# Patient Record
Sex: Male | Born: 1975 | Race: White | Hispanic: No | Marital: Married | Smoking: Never smoker
Health system: Southern US, Community
[De-identification: ages and names within clinical notes are randomized; demographics above are authoritative.]

## PROBLEM LIST (undated history)

## (undated) ENCOUNTER — Emergency Department: Disposition: A | Discharge status: Home / Self Care | Payer: Self-pay

## (undated) DIAGNOSIS — N200 Calculus of kidney: Secondary | ICD-10-CM

## (undated) DIAGNOSIS — E291 Testicular hypofunction: Secondary | ICD-10-CM

## (undated) DIAGNOSIS — N529 Male erectile dysfunction, unspecified: Secondary | ICD-10-CM

## (undated) DIAGNOSIS — Z87442 Personal history of urinary calculi: Secondary | ICD-10-CM

## (undated) DIAGNOSIS — K219 Gastro-esophageal reflux disease without esophagitis: Secondary | ICD-10-CM

## (undated) DIAGNOSIS — E669 Obesity, unspecified: Secondary | ICD-10-CM

## (undated) DIAGNOSIS — K635 Polyp of colon: Secondary | ICD-10-CM

## (undated) DIAGNOSIS — G4730 Sleep apnea, unspecified: Secondary | ICD-10-CM

## (undated) DIAGNOSIS — R011 Cardiac murmur, unspecified: Secondary | ICD-10-CM

## (undated) DIAGNOSIS — I10 Essential (primary) hypertension: Secondary | ICD-10-CM

## (undated) DIAGNOSIS — F419 Anxiety disorder, unspecified: Secondary | ICD-10-CM

## (undated) DIAGNOSIS — E7800 Pure hypercholesterolemia, unspecified: Secondary | ICD-10-CM

## (undated) DIAGNOSIS — R7989 Other specified abnormal findings of blood chemistry: Secondary | ICD-10-CM

## (undated) DIAGNOSIS — F32A Depression, unspecified: Secondary | ICD-10-CM

## (undated) DIAGNOSIS — M1990 Unspecified osteoarthritis, unspecified site: Secondary | ICD-10-CM

## (undated) DIAGNOSIS — C801 Malignant (primary) neoplasm, unspecified: Secondary | ICD-10-CM

## (undated) DIAGNOSIS — N400 Enlarged prostate without lower urinary tract symptoms: Secondary | ICD-10-CM

## (undated) DIAGNOSIS — K439 Ventral hernia without obstruction or gangrene: Secondary | ICD-10-CM

## (undated) DIAGNOSIS — E559 Vitamin D deficiency, unspecified: Secondary | ICD-10-CM

## (undated) DIAGNOSIS — Z9049 Acquired absence of other specified parts of digestive tract: Secondary | ICD-10-CM

## (undated) HISTORY — DX: Obesity, unspecified: E66.9

## (undated) HISTORY — PX: APPENDECTOMY: SHX54

## (undated) HISTORY — DX: Testicular hypofunction: E29.1

## (undated) HISTORY — DX: Essential (primary) hypertension: I10

## (undated) HISTORY — PX: LITHOTRIPSY: SUR834

## (undated) HISTORY — DX: Polyp of colon: K63.5

## (undated) HISTORY — DX: Calculus of kidney: N20.0

---

## 2005-02-11 ENCOUNTER — Emergency Department: Payer: Self-pay | Admitting: Emergency Medicine

## 2005-02-23 ENCOUNTER — Emergency Department: Payer: Self-pay | Admitting: Emergency Medicine

## 2007-03-12 ENCOUNTER — Emergency Department: Payer: Self-pay | Admitting: Emergency Medicine

## 2007-03-22 ENCOUNTER — Emergency Department: Payer: Self-pay | Admitting: Emergency Medicine

## 2007-09-05 ENCOUNTER — Emergency Department: Payer: Self-pay | Admitting: Emergency Medicine

## 2007-10-18 ENCOUNTER — Emergency Department: Payer: Self-pay | Admitting: Emergency Medicine

## 2007-10-19 ENCOUNTER — Inpatient Hospital Stay: Payer: Self-pay | Admitting: Internal Medicine

## 2007-12-09 ENCOUNTER — Emergency Department: Payer: Self-pay | Admitting: Emergency Medicine

## 2008-04-24 ENCOUNTER — Emergency Department: Payer: Self-pay | Admitting: Emergency Medicine

## 2008-10-29 ENCOUNTER — Emergency Department: Payer: Self-pay | Admitting: Internal Medicine

## 2009-02-17 DIAGNOSIS — Z8614 Personal history of Methicillin resistant Staphylococcus aureus infection: Secondary | ICD-10-CM

## 2009-02-17 HISTORY — DX: Personal history of Methicillin resistant Staphylococcus aureus infection: Z86.14

## 2010-05-06 ENCOUNTER — Emergency Department: Payer: Self-pay | Admitting: Internal Medicine

## 2012-04-27 ENCOUNTER — Emergency Department: Payer: Self-pay | Admitting: Emergency Medicine

## 2012-04-27 LAB — COMPREHENSIVE METABOLIC PANEL
Albumin: 4.1 g/dL (ref 3.4–5.0)
Alkaline Phosphatase: 71 U/L (ref 50–136)
BUN: 13 mg/dL (ref 7–18)
Co2: 27 mmol/L (ref 21–32)
Creatinine: 0.9 mg/dL (ref 0.60–1.30)
EGFR (African American): >60
Glucose: 100 mg/dL — ABNORMAL HIGH (ref 65–99)
Osmolality: 278 (ref 275–301)
Potassium: 3.8 mmol/L (ref 3.5–5.1)
SGOT(AST): 18 U/L (ref 15–37)
Sodium: 139 mmol/L (ref 136–145)
Total Protein: 7.1 g/dL (ref 6.4–8.2)

## 2012-04-27 LAB — CBC
HGB: 14.3 g/dL (ref 13.0–18.0)
MCH: 28.7 pg (ref 26.0–34.0)
MCV: 87 fL (ref 80–100)
Platelet: 198 10*3/uL (ref 150–440)
RBC: 5 10*6/uL (ref 4.40–5.90)
RDW: 13 % (ref 11.5–14.5)
WBC: 14.4 10*3/uL — ABNORMAL HIGH (ref 3.8–10.6)

## 2012-04-27 LAB — URINALYSIS, COMPLETE
Glucose,UR: NEGATIVE mg/dL (ref 0–75)
Nitrite: NEGATIVE
Squamous Epithelial: NONE SEEN
WBC UR: 1 /HPF (ref 0–5)

## 2012-05-03 DIAGNOSIS — N200 Calculus of kidney: Secondary | ICD-10-CM | POA: Insufficient documentation

## 2012-08-09 ENCOUNTER — Emergency Department: Payer: Self-pay | Admitting: Emergency Medicine

## 2012-08-09 LAB — URINALYSIS, COMPLETE
Glucose,UR: NEGATIVE mg/dL (ref 0–75)
Leukocyte Esterase: NEGATIVE
Protein: NEGATIVE
RBC,UR: 12 /HPF (ref 0–5)
Specific Gravity: 1.024 (ref 1.003–1.030)
WBC UR: 2 /HPF (ref 0–5)

## 2012-08-09 LAB — CBC
HCT: 42 % (ref 40.0–52.0)
HGB: 14.2 g/dL (ref 13.0–18.0)
MCH: 29 pg (ref 26.0–34.0)
MCHC: 33.9 g/dL (ref 32.0–36.0)
MCV: 86 fL (ref 80–100)
RDW: 12.8 % (ref 11.5–14.5)
WBC: 7.8 10*3/uL (ref 3.8–10.6)

## 2012-08-09 LAB — COMPREHENSIVE METABOLIC PANEL
Albumin: 3.8 g/dL (ref 3.4–5.0)
Alkaline Phosphatase: 79 U/L (ref 50–136)
Anion Gap: 5 — ABNORMAL LOW (ref 7–16)
BUN: 13 mg/dL (ref 7–18)
Bilirubin,Total: 0.5 mg/dL (ref 0.2–1.0)
Calcium, Total: 8.5 mg/dL (ref 8.5–10.1)
Chloride: 107 mmol/L (ref 98–107)
Creatinine: 0.96 mg/dL (ref 0.60–1.30)
EGFR (African American): >60
Glucose: 104 mg/dL — ABNORMAL HIGH (ref 65–99)
Potassium: 4.3 mmol/L (ref 3.5–5.1)
SGOT(AST): 24 U/L (ref 15–37)
Sodium: 140 mmol/L (ref 136–145)
Total Protein: 7 g/dL (ref 6.4–8.2)

## 2012-08-11 ENCOUNTER — Ambulatory Visit: Payer: Self-pay

## 2012-08-12 ENCOUNTER — Ambulatory Visit: Payer: Self-pay | Admitting: Urology

## 2012-12-20 ENCOUNTER — Ambulatory Visit: Payer: Self-pay | Admitting: Family Medicine

## 2013-07-31 ENCOUNTER — Emergency Department: Payer: Self-pay | Admitting: Emergency Medicine

## 2013-10-08 ENCOUNTER — Emergency Department: Payer: Self-pay | Admitting: Emergency Medicine

## 2013-10-11 DIAGNOSIS — S0292XA Unspecified fracture of facial bones, initial encounter for closed fracture: Secondary | ICD-10-CM | POA: Insufficient documentation

## 2015-01-24 ENCOUNTER — Emergency Department
Admission: EM | Admit: 2015-01-24 | Discharge: 2015-01-24 | Disposition: A | Discharge status: Home / Self Care | Payer: BLUE CROSS/BLUE SHIELD | Attending: Emergency Medicine | Admitting: Emergency Medicine

## 2015-01-24 ENCOUNTER — Encounter: Payer: Self-pay | Admitting: Urgent Care

## 2015-01-24 DIAGNOSIS — S0591XA Unspecified injury of right eye and orbit, initial encounter: Secondary | ICD-10-CM | POA: Diagnosis present

## 2015-01-24 DIAGNOSIS — X58XXXA Exposure to other specified factors, initial encounter: Secondary | ICD-10-CM | POA: Insufficient documentation

## 2015-01-24 DIAGNOSIS — Y9389 Activity, other specified: Secondary | ICD-10-CM | POA: Insufficient documentation

## 2015-01-24 DIAGNOSIS — Y9289 Other specified places as the place of occurrence of the external cause: Secondary | ICD-10-CM | POA: Insufficient documentation

## 2015-01-24 DIAGNOSIS — Y998 Other external cause status: Secondary | ICD-10-CM | POA: Insufficient documentation

## 2015-01-24 DIAGNOSIS — S0501XA Injury of conjunctiva and corneal abrasion without foreign body, right eye, initial encounter: Secondary | ICD-10-CM | POA: Insufficient documentation

## 2015-01-24 MED ORDER — FLUORESCEIN SODIUM 1 MG OP STRP
1.0000 | ORAL_STRIP | Freq: Once | OPHTHALMIC | Status: DC
  Filled 2015-01-24: qty 1

## 2015-01-24 MED ORDER — TETRACAINE HCL 0.5 % OP SOLN
2.0000 [drp] | Freq: Once | OPHTHALMIC | Status: DC
  Filled 2015-01-24: qty 2

## 2015-01-24 MED ORDER — KETOROLAC TROMETHAMINE 0.5 % OP SOLN: 1.0000 [drp] | Freq: Four times a day (QID) | OPHTHALMIC | Status: DC

## 2015-01-24 MED ORDER — CIPROFLOXACIN HCL 0.3 % OP SOLN
1.0000 [drp] | OPHTHALMIC | Status: DC
  Administered 2015-01-24: 1 [drp] via OPHTHALMIC
  Filled 2015-01-24: qty 2.5

## 2015-01-24 MED ORDER — CIPROFLOXACIN HCL 0.3 % OP SOLN: 1.0000 [drp] | OPHTHALMIC | Status: AC

## 2015-01-24 NOTE — ED Provider Notes (Signed)
[facility] Emergency Department Provider Note ____________________________________________  Time seen: Approximately 8:53 PM  I have reviewed the triage vital signs and the nursing notes.   HISTORY  Chief Complaint Eye Pain   HPI Joe Gutierrez is a 39 y.o. male who presents to the emergency department for right pain. He states that while standing door he noticed that his eye started to feel irritated. He has flushed his eyes at the eyewash station without relief. He does not wear contacts.No obvious changes to the vision, but very difficult to keep the eye open for more than a few seconds.   History reviewed. No pertinent past medical history.  There are no active problems to display for this patient.   Past Surgical History  Procedure Laterality Date  . Appendectomy      Current Outpatient Rx  Name  Route  Sig  Dispense  Refill  . ciprofloxacin (CILOXAN) 0.3 % ophthalmic solution   Right Eye   Place 1 drop into the right eye every 4 (four) hours while awake. Administer 1 drop, every 2 hours, while awake, for 2 days. Then 1 drop, every 4 hours, while awake, for the next 5 days.   5 mL   0   . ketorolac (ACULAR) 0.5 % ophthalmic solution   Right Eye   Place 1 drop into the right eye 4 (four) times daily.   5 mL   0     Allergies Codeine and Sulfa antibiotics  No family history on file.  Social History Social History  Substance Use Topics  . Smoking status: Never Smoker   . Smokeless tobacco: None  . Alcohol Use: No    Review of Systems   Constitutional: No fever/chills Eyes: Visual changes: no. ENT: No sore throat. Cardiovascular: Denies chest pain. Respiratory: Denies shortness of breath. Gastrointestinal: No abdominal pain.  No nausea, no vomiting.  No diarrhea.  No constipation. Musculoskeletal: Negative for pain. Skin: Negative for rash. Neurological: Negative for headaches, focal weakness or numbness. Psychiatric:At  baseline, no complaint Lymphatic:Swollen nodes-- no Allergic: Seasonal allergies: no 10-point ROS otherwise negative.  ____________________________________________  PHYSICAL EXAM:  VITAL SIGNS: ED Triage Vitals  Enc Vitals Group     BP 01/24/15 2025 148/99 mmHg     Pulse Rate 01/24/15 2024 66     Resp 01/24/15 2024 16     Temp 01/24/15 2024 98.5 F (36.9 C)     Temp Source 01/24/15 2024 Oral     SpO2 01/24/15 2024 97 %     Weight 01/24/15 2024 240 lb (108.863 kg)     Height 01/24/15 2024 5\' 11"  (1.803 m)     Head Cir --      Peak Flow --      Pain Score 01/24/15 2024 6     Pain Loc --      Pain Edu? --      Excl. in [place]? --     Constitutional: Alert and oriented. Well appearing and in no acute distress. Eyes: Visual acuity--see nursing documentation; No globe trauma; Eyelids normal to inspection; Everted for exam yes; Conjunctiva and sclera: Erythematous on the right; Corneas: Pinpoint corneal abrasions noted to the lower half of the field;  fluorescein dye uptake also noted to the upper cornea and the 11-1 positionwithout specific abrasion; Examined with fluorescein yes; EOM's intact; Pupils PERRLA; Anterior Chambers normal with limited exam.  Head: Atraumatic. Nose: No congestion/rhinnorhea. Mouth/Throat: Mucous membranes are moist.  Oropharynx non-erythematous. Neck: No stridor.  Cardiovascular: Normal rate, regular rhythm. Grossly normal heart sounds.  Good peripheral circulation. Respiratory: Normal respiratory effort.  No retractions. Gastrointestinal: Soft and nontender. No distention. No abdominal bruits. No CVA tenderness. Musculoskeletal:Normal ROM Neurologic:  Normal speech and language. No gross focal neurologic deficits are appreciated. Speech is normal. No gait instability. Skin:  Skin is warm, dry and intact. No rash noted. Psychiatric: Mood and affect are normal. Speech and behavior are normal.  ____________________________________________   LABS (all  labs ordered are listed, but only abnormal results are displayed)  Labs Reviewed - No data to display ____________________________________________  EKG   ____________________________________________  RADIOLOGY   ____________________________________________   PROCEDURES  Procedure(s) performed:   ____________________________________________   INITIAL IMPRESSION / ASSESSMENT AND PLAN / ED COURSE  Pertinent labs & imaging results that were available during my care of the patient were reviewed by me and considered in my medical decision making (see chart for details).   Patient was advised to follow up with ophthalmology for symptoms that are not improving over the next 2 days. He was  also advised to return to the ER for symptoms that change or worsen if unable to schedule an appointment.  ____________________________________________   FINAL CLINICAL IMPRESSION(S) / ED DIAGNOSES  Final diagnoses:  Corneal abrasion, right, initial encounter       Victorino Dike, FNP 01/24/15 2311  Earleen Newport, MD 01/24/15 [phone]

## 2015-01-24 NOTE — Discharge Instructions (Signed)

## 2015-01-24 NOTE — ED Notes (Signed)
Patient presents with c/o RIGHT eye pain; (+) tearing and blurred vision reported. Patient reports that he was sanding a door. Eye has been flushed.

## 2015-04-12 ENCOUNTER — Emergency Department
Admission: EM | Admit: 2015-04-12 | Discharge: 2015-04-12 | Disposition: A | Discharge status: Home / Self Care | Payer: BLUE CROSS/BLUE SHIELD | Attending: Emergency Medicine | Admitting: Emergency Medicine

## 2015-04-12 ENCOUNTER — Encounter: Payer: Self-pay | Admitting: Emergency Medicine

## 2015-04-12 DIAGNOSIS — W268XXA Contact with other sharp object(s), not elsewhere classified, initial encounter: Secondary | ICD-10-CM | POA: Diagnosis not present

## 2015-04-12 DIAGNOSIS — Y9289 Other specified places as the place of occurrence of the external cause: Secondary | ICD-10-CM | POA: Insufficient documentation

## 2015-04-12 DIAGNOSIS — Z79899 Other long term (current) drug therapy: Secondary | ICD-10-CM | POA: Diagnosis not present

## 2015-04-12 DIAGNOSIS — S61211A Laceration without foreign body of left index finger without damage to nail, initial encounter: Secondary | ICD-10-CM | POA: Insufficient documentation

## 2015-04-12 DIAGNOSIS — Y9389 Activity, other specified: Secondary | ICD-10-CM | POA: Diagnosis not present

## 2015-04-12 DIAGNOSIS — S61219A Laceration without foreign body of unspecified finger without damage to nail, initial encounter: Secondary | ICD-10-CM

## 2015-04-12 DIAGNOSIS — Y990 Civilian activity done for income or pay: Secondary | ICD-10-CM | POA: Insufficient documentation

## 2015-04-12 NOTE — ED Notes (Signed)
States he has a laceration to left index finger at work

## 2015-04-12 NOTE — Discharge Instructions (Signed)
Laceration Care, Adult  A laceration is a cut that goes through all layers of the skin. The cut also goes into the tissue that is right under the skin. Some cuts heal on their own. Others need to be closed with stitches (sutures), staples, skin adhesive strips, or wound glue. Taking care of your cut lowers your risk of infection and helps your cut to heal better.  HOW TO TAKE CARE OF YOUR CUT  For stitches or staples:  · Keep the wound clean and dry.  · If you were given a bandage (dressing), you should change it at least one time per day or as told by your doctor. You should also change it if it gets wet or dirty.  · Keep the wound completely dry for the first 24 hours or as told by your doctor. After that time, you may take a shower or a bath. However, make sure that the wound is not soaked in water until after the stitches or staples have been removed.  · Clean the wound one time each day or as told by your doctor:    Wash the wound with soap and water.    Rinse the wound with water until all of the soap comes off.    Pat the wound dry with a clean towel. Do not rub the wound.  · After you clean the wound, put a thin layer of antibiotic ointment on it as told by your doctor. This ointment:    Helps to prevent infection.    Keeps the bandage from sticking to the wound.  · Have your stitches or staples removed as told by your doctor.  If your doctor used skin adhesive strips:   · Keep the wound clean and dry.  · If you were given a bandage, you should change it at least one time per day or as told by your doctor. You should also change it if it gets dirty or wet.  · Do not get the skin adhesive strips wet. You can take a shower or a bath, but be careful to keep the wound dry.  · If the wound gets wet, pat it dry with a clean towel. Do not rub the wound.  · Skin adhesive strips fall off on their own. You can trim the strips as the wound heals. Do not remove any strips that are still stuck to the wound. They will  fall off after a while.  If your doctor used wound glue:  · Try to keep your wound dry, but you may briefly wet it in the shower or bath. Do not soak the wound in water, such as by swimming.  · After you take a shower or a bath, gently pat the wound dry with a clean towel. Do not rub the wound.  · Do not do any activities that will make you really sweaty until the skin glue has fallen off on its own.  · Do not apply liquid, cream, or ointment medicine to your wound while the skin glue is still on.  · If you were given a bandage, you should change it at least one time per day or as told by your doctor. You should also change it if it gets dirty or wet.  · If a bandage is placed over the wound, do not let the tape for the bandage touch the skin glue.  · Do not pick at the glue. The skin glue usually stays on for 5-10 days. Then, it   falls off of the skin.  General Instructions   · To help prevent scarring, make sure to cover your wound with sunscreen whenever you are outside after stitches are removed, after adhesive strips are removed, or when wound glue stays in place and the wound is healed. Make sure to wear a sunscreen of at least 30 SPF.  · Take over-the-counter and prescription medicines only as told by your doctor.  · If you were given antibiotic medicine or ointment, take or apply it as told by your doctor. Do not stop using the antibiotic even if your wound is getting better.  · Do not scratch or pick at the wound.  · Keep all follow-up visits as told by your doctor. This is important.  · Check your wound every day for signs of infection. Watch for:    Redness, swelling, or pain.    Fluid, blood, or pus.  · Raise (elevate) the injured area above the level of your heart while you are sitting or lying down, if possible.  GET HELP IF:  · You got a tetanus shot and you have any of these problems at the injection site:    Swelling.    Very bad pain.    Redness.    Bleeding.  · You have a fever.  · A wound that was  closed breaks open.  · You notice a bad smell coming from your wound or your bandage.  · You notice something coming out of the wound, such as wood or glass.  · Medicine does not help your pain.  · You have more redness, swelling, or pain at the site of your wound.  · You have fluid, blood, or pus coming from your wound.  · You notice a change in the color of your skin near your wound.  · You need to change the bandage often because fluid, blood, or pus is coming from the wound.  · You start to have a new rash.  · You start to have numbness around the wound.  GET HELP RIGHT AWAY IF:  · You have very bad swelling around the wound.  · Your pain suddenly gets worse and is very bad.  · You notice painful lumps near the wound or on skin that is anywhere on your body.  · You have a red streak going away from your wound.  · The wound is on your hand or foot and you cannot move a finger or toe like you usually can.  · The wound is on your hand or foot and you notice that your fingers or toes look pale or bluish.     This information is not intended to replace advice given to you by your health care provider. Make sure you discuss any questions you have with your health care provider.     Document Released: 07/23/2007 Document Revised: 06/20/2014 Document Reviewed: 01/30/2014  Elsevier Interactive Patient Education ©2016 [employer]

## 2015-04-12 NOTE — ED Provider Notes (Signed)
[facility] Emergency Department Provider Note  ____________________________________________  Time seen: Approximately 12:28 PM  I have reviewed the triage vital signs and the nursing notes.   HISTORY  Chief Complaint Laceration    HPI NOAHH BAUSMAN is a 40 y.o. male box Cutter laceration to left index finger which occurred at work. Patient state he was controlled direct pressure. Patient denies any loss of sensation or loss of function of the finger. Patient states tetanus shot is up-to-date. Patient rates his pain discomfort as a 1/10. No other palliative measures taken for this complaint. Patient is right-hand dominant.   History reviewed. No pertinent past medical history.  There are no active problems to display for this patient.   Past Surgical History  Procedure Laterality Date  . Appendectomy      Current Outpatient Rx  Name  Route  Sig  Dispense  Refill  . ketorolac (ACULAR) 0.5 % ophthalmic solution   Right Eye   Place 1 drop into the right eye 4 (four) times daily.   5 mL   0     Allergies Codeine and Sulfa antibiotics  No family history on file.  Social History Social History  Substance Use Topics  . Smoking status: Never Smoker   . Smokeless tobacco: None  . Alcohol Use: No    Review of Systems Constitutional: No fever/chills Eyes: No visual changes. ENT: No sore throat. Cardiovascular: Denies chest pain. Respiratory: Denies shortness of breath. Gastrointestinal: No abdominal pain.  No nausea, no vomiting.  No diarrhea.  No constipation. Genitourinary: Negative for dysuria. Musculoskeletal: Negative for back pain. Skin: Negative for rash. Neurological: Negative for headaches, focal weakness or numbness. Allergic/Immunilogical: Codeine and sulfa antibiotics.  10-point ROS otherwise negative.  ____________________________________________   PHYSICAL EXAM:  VITAL SIGNS: ED Triage Vitals  Enc Vitals Group   BP 04/12/15 1143 141/94 mmHg     Pulse Rate 04/12/15 1143 61     Resp 04/12/15 1143 20     Temp 04/12/15 1143 98.1 F (36.7 C)     Temp Source 04/12/15 1143 Oral     SpO2 04/12/15 1143 97 %     Weight 04/12/15 1143 230 lb (104.327 kg)     Height 04/12/15 1143 5\' 11"  (1.803 m)     Head Cir --      Peak Flow --      Pain Score 04/12/15 1203 1     Pain Loc --      Pain Edu? --      Excl. in [place]? --     Constitutional: Alert and oriented. Well appearing and in no acute distress. Eyes: Conjunctivae are normal. PERRL. EOMI. Head: Atraumatic. Nose: No congestion/rhinnorhea. Mouth/Throat: Mucous membranes are moist.  Oropharynx non-erythematous. Neck: No stridor.  No cervical spine tenderness to palpation. Hematological/Lymphatic/Immunilogical: No cervical lymphadenopathy. Cardiovascular: Normal rate, regular rhythm. Grossly normal heart sounds.  Good peripheral circulation. Respiratory: Normal respiratory effort.  No retractions. Lungs CTAB. Gastrointestinal: Soft and nontender. No distention. No abdominal bruits. No CVA tenderness. Musculoskeletal: No lower extremity tenderness nor edema.  No joint effusions. Neurologic:  Normal speech and language. No gross focal neurologic deficits are appreciated. No gait instability. Skin:  Skin is warm, dry and intact. No rash noted. Psychiatric: Mood and affect are normal. Speech and behavior are normal.  ____________________________________________   LABS (all labs ordered are listed, but only abnormal results are displayed)  Labs Reviewed - No data to display ____________________________________________  EKG   ____________________________________________  RADIOLOGY   ____________________________________________   PROCEDURES  Procedure(s) performed: See procedure note  Critical Care performed: No  ______LACERATION REPAIR Performed by: Sable Feil Authorized by: Sable Feil Consent: Verbal consent obtained. Risks and  benefits: risks, benefits and alternatives were discussed Consent given by: patient Patient identity confirmed: provided demographic data Prepped and Draped in normal sterile fashion Wound explored  Laceration Location: S2 left index finger  Laceration Length: 1cm  No Foreign Bodies seen or palpated  Irrigation method: syringe Amount of cleaning: standard  Skin closure: Dermabond    Patient tolerance: Patient tolerated the procedure well with no immediate complications. ______________________________________   INITIAL IMPRESSION / ASSESSMENT AND PLAN / ED COURSE  Pertinent labs & imaging results that were available during my care of the patient were reviewed by me and considered in my medical decision making (see chart for details).  Left index finger laceration closed with Dermabond. Patient given discharge care instructions. Patient advised return by ER wound reopen for healing process. ____________________________________________   FINAL CLINICAL IMPRESSION(S) / ED DIAGNOSES  Final diagnoses:  Laceration of finger, initial encounter      Sable Feil, PA-C 04/12/15 [place], MD 04/12/15 1537

## 2015-08-27 ENCOUNTER — Encounter: Payer: Self-pay | Admitting: Unknown Physician Specialty

## 2015-08-27 ENCOUNTER — Ambulatory Visit (INDEPENDENT_AMBULATORY_CARE_PROVIDER_SITE_OTHER): Payer: BLUE CROSS/BLUE SHIELD | Admitting: Unknown Physician Specialty

## 2015-08-27 VITALS — BP 150/103 | HR 60 | Temp 98.2°F | Ht 70.7 in | Wt 252.8 lb

## 2015-08-27 DIAGNOSIS — R030 Elevated blood-pressure reading, without diagnosis of hypertension: Secondary | ICD-10-CM

## 2015-08-27 DIAGNOSIS — IMO0001 Reserved for inherently not codable concepts without codable children: Secondary | ICD-10-CM

## 2015-08-27 DIAGNOSIS — I10 Essential (primary) hypertension: Secondary | ICD-10-CM | POA: Insufficient documentation

## 2015-08-27 DIAGNOSIS — E669 Obesity, unspecified: Secondary | ICD-10-CM

## 2015-08-27 DIAGNOSIS — M5481 Occipital neuralgia: Secondary | ICD-10-CM | POA: Insufficient documentation

## 2015-08-27 MED ORDER — IBUPROFEN 800 MG PO TABS: 800.0000 mg | ORAL_TABLET | Freq: Three times a day (TID) | ORAL | Status: DC | PRN

## 2015-08-27 MED ORDER — CYCLOBENZAPRINE HCL 10 MG PO TABS: 10.0000 mg | ORAL_TABLET | Freq: Three times a day (TID) | ORAL | Status: DC | PRN

## 2015-08-27 NOTE — Progress Notes (Signed)
   BP 150/103 mmHg  Pulse 60  Temp(Src) 98.2 F (36.8 C)  Ht 5' 10.7" (1.796 m)  Wt 252 lb 12.8 oz (114.669 kg)  BMI 35.55 kg/m2  SpO2 98%   Subjective:    Patient ID: Joe Gutierrez, male    DOB: 1976-01-04, 40 y.o.   MRN: [id number]  HPI: Joe Gutierrez is a 40 y.o. male  Chief Complaint  Patient presents with  . Back Pain    pt states he has low back pain that started Saturday    Pt is lost to f/u.    Low back pain Pt states he carried 3 AC units down stairs on Friday and woke up on Saturday with low back pain.  Sitting for a period of time hurts and first getting up.  States this has been a problem ever since the age of 29.  This is the first time in 3-4 years he has had pain. No bowel or bladder problems   Relevant past medical, surgical, family and social history reviewed and updated as indicated. Interim medical history since our last visit reviewed. Allergies and medications reviewed and updated.  Review of Systems  Per HPI unless specifically indicated above     Objective:    BP 150/103 mmHg  Pulse 60  Temp(Src) 98.2 F (36.8 C)  Ht 5' 10.7" (1.796 m)  Wt 252 lb 12.8 oz (114.669 kg)  BMI 35.55 kg/m2  SpO2 98%  Wt Readings from Last 3 Encounters:  08/27/15 252 lb 12.8 oz (114.669 kg)  07/06/13 239 lb 6 oz (108.58 kg)  04/12/15 230 lb (104.327 kg)    Physical Exam  Constitutional: He is oriented to person, place, and time. He appears well-developed and well-nourished. No distress.  HENT:  Head: Normocephalic and atraumatic.  Eyes: Conjunctivae and lids are normal. Right eye exhibits no discharge. Left eye exhibits no discharge. No scleral icterus.  Cardiovascular: Normal rate.   Pulmonary/Chest: Effort normal.  Abdominal: Normal appearance. There is no splenomegaly or hepatomegaly.  Musculoskeletal: Normal range of motion.  Neurological: He is alert and oriented to person, place, and time.  Skin: Skin is intact. No rash noted. No pallor.   Psychiatric: He has a normal mood and affect. His behavior is normal. Judgment and thought content normal.      Assessment & Plan:   Problem List Items Addressed This Visit      Unprioritized   Elevated BP - Primary    Noted high today.  In pain.  Will f/u with physical          Follow up plan: Return for physical.

## 2015-08-27 NOTE — Assessment & Plan Note (Signed)
Noted high today.  In pain.  Will f/u with physical

## 2015-09-18 ENCOUNTER — Encounter (INDEPENDENT_AMBULATORY_CARE_PROVIDER_SITE_OTHER): Payer: Self-pay

## 2015-10-10 ENCOUNTER — Ambulatory Visit (INDEPENDENT_AMBULATORY_CARE_PROVIDER_SITE_OTHER): Payer: BLUE CROSS/BLUE SHIELD | Admitting: Unknown Physician Specialty

## 2015-10-10 ENCOUNTER — Encounter: Payer: Self-pay | Admitting: Unknown Physician Specialty

## 2015-10-10 VITALS — BP 134/84 | HR 76 | Temp 98.1°F | Ht 69.6 in | Wt 245.0 lb

## 2015-10-10 DIAGNOSIS — Z0000 Encounter for general adult medical examination without abnormal findings: Secondary | ICD-10-CM

## 2015-10-10 DIAGNOSIS — Z23 Encounter for immunization: Secondary | ICD-10-CM | POA: Diagnosis not present

## 2015-10-10 DIAGNOSIS — L739 Follicular disorder, unspecified: Secondary | ICD-10-CM | POA: Diagnosis not present

## 2015-10-10 MED ORDER — TRIAMCINOLONE ACETONIDE 0.1 % EX CREA: 1.0000 "application " | TOPICAL_CREAM | Freq: Two times a day (BID) | CUTANEOUS | 2 refills | Status: DC

## 2015-10-10 NOTE — Progress Notes (Signed)
BP 134/84 (BP Location: Left Arm, Patient Position: Sitting, Cuff Size: Large)   Pulse 76   Temp 98.1 F (36.7 C)   Ht 5' 9.6" (1.768 m)   Wt 245 lb (111.1 kg)   SpO2 96%   BMI 35.56 kg/m    Subjective:    Patient ID: Joe Gutierrez, male    DOB: [birth year], 40 y.o.   MRN: [id number]  HPI: Joe Gutierrez is a 40 y.o. male   No chief complaint on file.  Pt is here for general history and physical  Social History   Social History  . Marital status: Married    Spouse name: N/A  . Number of children: N/A  . Years of education: N/A   Occupational History  . Not on file.   Social History Main Topics  . Smoking status: Never Smoker  . Smokeless tobacco: Never Used  . Alcohol use No  . Drug use: No  . Sexual activity: Yes   Other Topics Concern  . Not on file   Social History Narrative  . No narrative on file   Family History  Problem Relation Age of Onset  . Diabetes Mother   . Hypertension Mother   . Alcohol abuse Father   . Heart disease Father   . Hypertension Father   . Diabetes Maternal Grandmother   . Stroke Maternal Grandfather   . Hypertension Brother   . Asthma Daughter   . Heart disease Paternal Grandfather    Past Medical History:  Diagnosis Date  . Hypertension   . Kidney stones   . Obesity    Past Surgical History:  Procedure Laterality Date  . APPENDECTOMY    . LITHOTRIPSY     Obesity Pt lost 7 pounds from last visit.  Working on exercise and diet.    Relevant past medical, surgical, family and social history reviewed and updated as indicated. Interim medical history since our last visit reviewed. Allergies and medications reviewed and updated.  Review of Systems  Per HPI unless specifically indicated above     Objective:    BP 134/84 (BP Location: Left Arm, Patient Position: Sitting, Cuff Size: Large)   Pulse 76   Temp 98.1 F (36.7 C)   Ht 5' 9.6" (1.768 m)   Wt 245 lb (111.1 kg)   SpO2 96%   BMI 35.56 kg/m   Wt  Readings from Last 3 Encounters:  10/10/15 245 lb (111.1 kg)  08/27/15 252 lb 12.8 oz (114.7 kg)  07/06/13 239 lb 6 oz (108.6 kg)    Physical Exam  Constitutional: He is oriented to person, place, and time. He appears well-developed and well-nourished.  HENT:  Head: Normocephalic.  Right Ear: Tympanic membrane, external ear and ear canal normal.  Left Ear: Tympanic membrane, external ear and ear canal normal.  Mouth/Throat: Uvula is midline, oropharynx is clear and moist and mucous membranes are normal.  Eyes: Pupils are equal, round, and reactive to light.  Cardiovascular: Normal rate, regular rhythm and normal heart sounds.  Exam reveals no gallop and no friction rub.   No murmur heard. Pulmonary/Chest: Effort normal and breath sounds normal. No respiratory distress.  Abdominal: Soft. Bowel sounds are normal. He exhibits no distension. There is no tenderness.  Musculoskeletal: Normal range of motion.  Neurological: He is alert and oriented to person, place, and time. He has normal reflexes.  Skin: Skin is warm and dry.  Follicular inflammation upper legs  Psychiatric: He has a normal mood  and affect. His behavior is normal. Judgment and thought content normal.    Results for orders placed or performed in visit on 08/09/12  Comprehensive metabolic panel  Result Value Ref Range   Glucose 104 (H) 65 - 99 mg/dL   BUN 13 7 - 18 mg/dL   Creatinine 0.96 0.60 - 1.30 mg/dL   Sodium 140 136 - 145 mmol/L   Potassium 4.3 3.5 - 5.1 mmol/L   Chloride 107 98 - 107 mmol/L   Co2 28 21 - 32 mmol/L   Calcium, Total 8.5 8.5 - 10.1 mg/dL   SGOT(AST) 24 15 - 37 Unit/L   SGPT (ALT) 22 12 - 78 U/L   Alkaline Phosphatase 79 50 - 136 Unit/L   Albumin 3.8 3.4 - 5.0 g/dL   Total Protein 7.0 6.4 - 8.2 g/dL   Bilirubin,Total 0.5 0.2 - 1.0 mg/dL   Osmolality 280 275 - 301   Anion Gap 5 (L) 7 - 16   EGFR (African American) >60    EGFR (Non-African Amer.) >60   CBC  Result Value Ref Range   WBC 7.8  3.8 - 10.6 x10 3/mm 3   RBC 4.90 4.40 - 5.90 x10 6/mm 3   HGB 14.2 13.0 - 18.0 g/dL   HCT 42.0 40.0 - 52.0 %   MCV 86 80 - 100 fL   MCH 29.0 26.0 - 34.0 pg   MCHC 33.9 32.0 - 36.0 g/dL   RDW 12.8 11.5 - 14.5 %   Platelet 158 150 - 440 x10 3/mm 3  Lipase, blood  Result Value Ref Range   Lipase 114 73 - 393 Unit/L  Urinalysis, Complete  Result Value Ref Range   Color - urine Yellow    Clarity - urine Hazy    Glucose,UR Negative 0 - 75 mg/dL   Bilirubin,UR Negative NEGATIVE   Ketone Negative NEGATIVE   Specific Gravity 1.024 1.003 - 1.030   Blood 1+ NEGATIVE   Ph 7.0 4.5 - 8.0   Protein Negative NEGATIVE   Nitrite Negative NEGATIVE   Leukocyte Esterase Negative NEGATIVE   RBC,UR 12 /HPF 0 - 5 /HPF   WBC UR 2 /HPF 0 - 5 /HPF   Bacteria 1+ NONE SEEN   Squamous Epithelial <1 /HPF    Mucous PRESENT    Amorphous Crystal PRESENT       Assessment & Plan:   Problem List Items Addressed This Visit      Unprioritized   Folliculitis    Other Visit Diagnoses    Need for diphtheria-tetanus-pertussis (Tdap) vaccine, adult/adolescent    -  Primary   Relevant Orders   Tdap vaccine greater than or equal to 7yo IM (Completed)   Routine general medical examination at a health care facility       Relevant Orders   CBC with Differential/Platelet   Comprehensive metabolic panel   Lipid Panel w/o Chol/HDL Ratio   TSH       Follow up plan: Return in about 1 year (around 10/09/2016).

## 2015-10-10 NOTE — Patient Instructions (Addendum)
Tdap Vaccine (Tetanus, Diphtheria and Pertussis): What You Need to Know 1. Why get vaccinated? Tetanus, diphtheria and pertussis are very serious diseases. Tdap vaccine can protect us from these diseases. And, Tdap vaccine given to pregnant women can protect newborn babies against pertussis. TETANUS (Lockjaw) is rare in the [place] today. It causes painful muscle tightening and stiffness, usually all over the body.  It can lead to tightening of muscles in the head and neck so you can't open your mouth, swallow, or sometimes even breathe. Tetanus kills about 1 out of 10 people who are infected even after receiving the best medical care. DIPHTHERIA is also rare in the [place] today. It can cause a thick coating to form in the back of the throat.  It can lead to breathing problems, heart failure, paralysis, and death. PERTUSSIS (Whooping Cough) causes severe coughing spells, which can cause difficulty breathing, vomiting and disturbed sleep.  It can also lead to weight loss, incontinence, and rib fractures. Up to 2 in 100 adolescents and 5 in 100 adults with pertussis are hospitalized or have complications, which could include pneumonia or death. These diseases are caused by bacteria. Diphtheria and pertussis are spread from person to person through secretions from coughing or sneezing. Tetanus enters the body through cuts, scratches, or wounds. Before vaccines, as many as 200,000 cases of diphtheria, 200,000 cases of pertussis, and hundreds of cases of tetanus, were reported in the [place] each year. Since vaccination began, reports of cases for tetanus and diphtheria have dropped by about 99% and for pertussis by about 80%. 2. Tdap vaccine Tdap vaccine can protect adolescents and adults from tetanus, diphtheria, and pertussis. One dose of Tdap is routinely given at age 11 or 12. People who did not get Tdap at that age should get it as soon as possible. Tdap is especially important  for healthcare professionals and anyone having close contact with a baby younger than 12 months. Pregnant women should get a dose of Tdap during every pregnancy, to protect the newborn from pertussis. Infants are most at risk for severe, life-threatening complications from pertussis. Another vaccine, called Td, protects against tetanus and diphtheria, but not pertussis. A Td booster should be given every 10 years. Tdap may be given as one of these boosters if you have never gotten Tdap before. Tdap may also be given after a severe cut or burn to prevent tetanus infection. Your doctor or the person giving you the vaccine can give you more information. Tdap may safely be given at the same time as other vaccines. 3. Some people should not get this vaccine  A person who has ever had a life-threatening allergic reaction after a previous dose of any diphtheria, tetanus or pertussis containing vaccine, OR has a severe allergy to any part of this vaccine, should not get Tdap vaccine. Tell the person giving the vaccine about any severe allergies.  Anyone who had coma or long repeated seizures within 7 days after a childhood dose of DTP or DTaP, or a previous dose of Tdap, should not get Tdap, unless a cause other than the vaccine was found. They can still get Td.  Talk to your doctor if you:  have seizures or another nervous system problem,  had severe pain or swelling after any vaccine containing diphtheria, tetanus or pertussis,  ever had a condition called Guillain-Barr Syndrome (GBS),  aren't feeling well on the day the shot is scheduled. 4. Risks With any medicine, including vaccines, there is   a chance of side effects. These are usually mild and go away on their own. Serious reactions are also possible but are rare. Most people who get Tdap vaccine do not have any problems with it. Mild problems following Tdap (Did not interfere with activities)  Pain where the shot was given (about 3 in 4  adolescents or 2 in 3 adults)  Redness or swelling where the shot was given (about 1 person in 5)  Mild fever of at least 100.4F (up to about 1 in 25 adolescents or 1 in 100 adults)  Headache (about 3 or 4 people in 10)  Tiredness (about 1 person in 3 or 4)  Nausea, vomiting, diarrhea, stomach ache (up to 1 in 4 adolescents or 1 in 10 adults)  Chills, sore joints (about 1 person in 10)  Body aches (about 1 person in 3 or 4)  Rash, swollen glands (uncommon) Moderate problems following Tdap (Interfered with activities, but did not require medical attention)  Pain where the shot was given (up to 1 in 5 or 6)  Redness or swelling where the shot was given (up to about 1 in 16 adolescents or 1 in 12 adults)  Fever over 102F (about 1 in 100 adolescents or 1 in 250 adults)  Headache (about 1 in 7 adolescents or 1 in 10 adults)  Nausea, vomiting, diarrhea, stomach ache (up to 1 or 3 people in 100)  Swelling of the entire arm where the shot was given (up to about 1 in 500). Severe problems following Tdap (Unable to perform usual activities; required medical attention)  Swelling, severe pain, bleeding and redness in the arm where the shot was given (rare). Problems that could happen after any vaccine:  People sometimes faint after a medical procedure, including vaccination. Sitting or lying down for about 15 minutes can help prevent fainting, and injuries caused by a fall. Tell your doctor if you feel dizzy, or have vision changes or ringing in the ears.  Some people get severe pain in the shoulder and have difficulty moving the arm where a shot was given. This happens very rarely.  Any medication can cause a severe allergic reaction. Such reactions from a vaccine are very rare, estimated at fewer than 1 in a million doses, and would happen within a few minutes to a few hours after the vaccination. As with any medicine, there is a very remote chance of a vaccine causing a serious  injury or death. The safety of vaccines is always being monitored. For more information, visit: www.cdc.gov/vaccinesafety/ 5. What if there is a serious problem? What should I look for?  Look for anything that concerns you, such as signs of a severe allergic reaction, very high fever, or unusual behavior.  Signs of a severe allergic reaction can include hives, swelling of the face and throat, difficulty breathing, a fast heartbeat, dizziness, and weakness. These would usually start a few minutes to a few hours after the vaccination. What should I do?  If you think it is a severe allergic reaction or other emergency that can't wait, call 9-1-1 or get the person to the nearest hospital. Otherwise, call your doctor.  Afterward, the reaction should be reported to the Vaccine Adverse Event Reporting System (VAERS). Your doctor might file this report, or you can do it yourself through the VAERS web site at www.vaers.hhs.gov, or by calling 1-800-822-7967. VAERS does not give medical advice.  6. The National Vaccine Injury Compensation Program The National Vaccine Injury Compensation Program (  VICP) is a federal program that was created to compensate people who may have been injured by certain vaccines. Persons who believe they may have been injured by a vaccine can learn about the program and about filing a claim by calling 1-800-338-2382 or visiting the VICP website at www.hrsa.gov/vaccinecompensation. There is a time limit to file a claim for compensation. 7. How can I learn more?  Ask your doctor. He or she can give you the vaccine package insert or suggest other sources of information.  Call your local or state health department.  Contact the Centers for Disease Control and Prevention (CDC):  Call 1-800-232-4636 (1-800-CDC-INFO) or  Visit CDC's website at www.cdc.gov/vaccines CDC Tdap Vaccine VIS (04/12/13)   This information is not intended to replace advice given to you by your health care  provider. Make sure you discuss any questions you have with your health care provider.   Document Released: 08/05/2011 Document Revised: 02/24/2014 Document Reviewed: 05/18/2013 Elsevier Interactive Patient Education 2016 Elsevier Inc.  

## 2015-10-10 NOTE — Progress Notes (Signed)
BP 134/84 (BP Location: Left Arm, Patient Position: Sitting, Cuff Size: Large)   Pulse 76   Temp 98.1 F (36.7 C)   Ht 5' 9.6" (1.768 m)   Wt 245 lb (111.1 kg)   SpO2 96%   BMI 35.56 kg/m    Subjective:    Patient ID: Joe Gutierrez, male    DOB: 21-Mar-1975, 40 y.o.   MRN: [id number]  HPI: Joe Gutierrez is a 40 y.o. male  No chief complaint on file.   Relevant past medical, surgical, family and social history reviewed and updated as indicated. Interim medical history since our last visit reviewed. Allergies and medications reviewed and updated.  Review of Systems  Per HPI unless specifically indicated above     Objective:    BP 134/84 (BP Location: Left Arm, Patient Position: Sitting, Cuff Size: Large)   Pulse 76   Temp 98.1 F (36.7 C)   Ht 5' 9.6" (1.768 m)   Wt 245 lb (111.1 kg)   SpO2 96%   BMI 35.56 kg/m   Wt Readings from Last 3 Encounters:  10/10/15 245 lb (111.1 kg)  08/27/15 252 lb 12.8 oz (114.7 kg)  07/06/13 239 lb 6 oz (108.6 kg)    Physical Exam  Results for orders placed or performed in visit on 08/09/12  Comprehensive metabolic panel  Result Value Ref Range   Glucose 104 (H) 65 - 99 mg/dL   BUN 13 7 - 18 mg/dL   Creatinine 0.96 0.60 - 1.30 mg/dL   Sodium 140 136 - 145 mmol/L   Potassium 4.3 3.5 - 5.1 mmol/L   Chloride 107 98 - 107 mmol/L   Co2 28 21 - 32 mmol/L   Calcium, Total 8.5 8.5 - 10.1 mg/dL   SGOT(AST) 24 15 - 37 Unit/L   SGPT (ALT) 22 12 - 78 U/L   Alkaline Phosphatase 79 50 - 136 Unit/L   Albumin 3.8 3.4 - 5.0 g/dL   Total Protein 7.0 6.4 - 8.2 g/dL   Bilirubin,Total 0.5 0.2 - 1.0 mg/dL   Osmolality 280 275 - 301   Anion Gap 5 (L) 7 - 16   EGFR (African American) >60    EGFR (Non-African Amer.) >60   CBC  Result Value Ref Range   WBC 7.8 3.8 - 10.6 x10 3/mm 3   RBC 4.90 4.40 - 5.90 x10 6/mm 3   HGB 14.2 13.0 - 18.0 g/dL   HCT 42.0 40.0 - 52.0 %   MCV 86 80 - 100 fL   MCH 29.0 26.0 - 34.0 pg   MCHC 33.9 32.0 -  36.0 g/dL   RDW 12.8 11.5 - 14.5 %   Platelet 158 150 - 440 x10 3/mm 3  Lipase, blood  Result Value Ref Range   Lipase 114 73 - 393 Unit/L  Urinalysis, Complete  Result Value Ref Range   Color - urine Yellow    Clarity - urine Hazy    Glucose,UR Negative 0 - 75 mg/dL   Bilirubin,UR Negative NEGATIVE   Ketone Negative NEGATIVE   Specific Gravity 1.024 1.003 - 1.030   Blood 1+ NEGATIVE   Ph 7.0 4.5 - 8.0   Protein Negative NEGATIVE   Nitrite Negative NEGATIVE   Leukocyte Esterase Negative NEGATIVE   RBC,UR 12 /HPF 0 - 5 /HPF   WBC UR 2 /HPF 0 - 5 /HPF   Bacteria 1+ NONE SEEN   Squamous Epithelial <1 /HPF    Mucous PRESENT  Amorphous Crystal PRESENT       Assessment & Plan:   Problem List Items Addressed This Visit    None    Visit Diagnoses    Need for diphtheria-tetanus-pertussis (Tdap) vaccine, adult/adolescent    -  Primary   Relevant Orders   Tdap vaccine greater than or equal to 7yo IM (Completed)       Follow up plan: No Follow-up on file.

## 2015-10-11 ENCOUNTER — Encounter: Payer: Self-pay | Admitting: Unknown Physician Specialty

## 2015-10-11 LAB — CBC WITH DIFFERENTIAL/PLATELET
BASOS: 0 %
Basophils Absolute: 0 10*3/uL (ref 0.0–0.2)
EOS (ABSOLUTE): 0.2 10*3/uL (ref 0.0–0.4)
EOS: 2 %
HEMATOCRIT: 44.3 % (ref 37.5–51.0)
Hemoglobin: 14.7 g/dL (ref 12.6–17.7)
IMMATURE GRANS (ABS): 0 10*3/uL (ref 0.0–0.1)
Immature Granulocytes: 0 %
LYMPHS: 27 %
Lymphocytes Absolute: 2.5 10*3/uL (ref 0.7–3.1)
MCH: 29.1 pg (ref 26.6–33.0)
MCHC: 33.2 g/dL (ref 31.5–35.7)
MCV: 88 fL (ref 79–97)
Monocytes Absolute: 0.8 10*3/uL (ref 0.1–0.9)
Monocytes: 9 %
NEUTROS ABS: 5.8 10*3/uL (ref 1.4–7.0)
Neutrophils: 62 %
PLATELETS: 258 10*3/uL (ref 150–379)
RBC: 5.06 x10E6/uL (ref 4.14–5.80)
RDW: 12.9 % (ref 12.3–15.4)
WBC: 9.3 10*3/uL (ref 3.4–10.8)

## 2015-10-11 LAB — LIPID PANEL W/O CHOL/HDL RATIO
Cholesterol, Total: 192 mg/dL (ref 100–199)
HDL: 43 mg/dL (ref >39)
LDL Calculated: 136 mg/dL — ABNORMAL HIGH (ref 0–99)
Triglycerides: 64 mg/dL (ref 0–149)
VLDL Cholesterol Cal: 13 mg/dL (ref 5–40)

## 2015-10-11 LAB — COMPREHENSIVE METABOLIC PANEL
A/G RATIO: 2.1 (ref 1.2–2.2)
ALBUMIN: 4.6 g/dL (ref 3.5–5.5)
ALT: 15 IU/L (ref 0–44)
AST: 15 IU/L (ref 0–40)
Alkaline Phosphatase: 70 IU/L (ref 39–117)
BILIRUBIN TOTAL: 0.5 mg/dL (ref 0.0–1.2)
BUN / CREAT RATIO: 15 (ref 9–20)
BUN: 16 mg/dL (ref 6–24)
CALCIUM: 9 mg/dL (ref 8.7–10.2)
CHLORIDE: 102 mmol/L (ref 96–106)
CO2: 23 mmol/L (ref 18–29)
Creatinine, Ser: 1.07 mg/dL (ref 0.76–1.27)
GFR, EST AFRICAN AMERICAN: 100 mL/min/{1.73_m2} (ref >59)
GFR, EST NON AFRICAN AMERICAN: 86 mL/min/{1.73_m2} (ref >59)
GLOBULIN, TOTAL: 2.2 g/dL (ref 1.5–4.5)
Glucose: 84 mg/dL (ref 65–99)
POTASSIUM: 4.4 mmol/L (ref 3.5–5.2)
SODIUM: 144 mmol/L (ref 134–144)
TOTAL PROTEIN: 6.8 g/dL (ref 6.0–8.5)

## 2015-10-11 LAB — TSH: TSH: 0.925 u[IU]/mL (ref 0.450–4.500)

## 2015-10-29 ENCOUNTER — Encounter: Payer: Self-pay | Admitting: Family Medicine

## 2015-10-29 ENCOUNTER — Ambulatory Visit (INDEPENDENT_AMBULATORY_CARE_PROVIDER_SITE_OTHER): Payer: BLUE CROSS/BLUE SHIELD | Admitting: Family Medicine

## 2015-10-29 VITALS — BP 120/85 | HR 74 | Temp 98.0°F | Wt 247.0 lb

## 2015-10-29 DIAGNOSIS — B356 Tinea cruris: Secondary | ICD-10-CM | POA: Diagnosis not present

## 2015-10-29 DIAGNOSIS — Z23 Encounter for immunization: Secondary | ICD-10-CM

## 2015-10-29 LAB — BAYER DCA HB A1C WAIVED: HB A1C: 5.4 % (ref <7.0)

## 2015-10-29 MED ORDER — TERBINAFINE HCL 1 % EX CREA: 1.0000 "application " | TOPICAL_CREAM | Freq: Two times a day (BID) | CUTANEOUS | 0 refills | Status: DC

## 2015-10-29 NOTE — Progress Notes (Signed)
BP 120/85 (BP Location: Left Arm, Patient Position: Sitting, Cuff Size: Normal)   Pulse 74   Temp 98 F (36.7 C)   Wt 247 lb (112 kg) Comment: with shoes  SpO2 98%   BMI 35.85 kg/m    Subjective:    Patient ID: Joe Gutierrez, male    DOB: 1975/03/15, 40 y.o.   MRN: [id number]  HPI: Joe Gutierrez is a 40 y.o. male  Chief Complaint  Patient presents with  . Rash  Patient with itching and burning stinging rash is been getting worse over the last several weeks. Had blood work for a physical last month that was normal. No no new exposures isn't around wet fabric or material has not been sweating excessively.  Relevant past medical, surgical, family and social history reviewed and updated as indicated. Interim medical history since our last visit reviewed. Allergies and medications reviewed and updated.  Review of Systems  Constitutional: Negative.   Respiratory: Negative.   Cardiovascular: Negative.     Per HPI unless specifically indicated above     Objective:    BP 120/85 (BP Location: Left Arm, Patient Position: Sitting, Cuff Size: Normal)   Pulse 74   Temp 98 F (36.7 C)   Wt 247 lb (112 kg) Comment: with shoes  SpO2 98%   BMI 35.85 kg/m   Wt Readings from Last 3 Encounters:  10/29/15 247 lb (112 kg)  10/10/15 245 lb (111.1 kg)  08/27/15 252 lb 12.8 oz (114.7 kg)    Physical Exam  Constitutional: He is oriented to person, place, and time. He appears well-developed and well-nourished. No distress.  HENT:  Head: Normocephalic and atraumatic.  Right Ear: Hearing normal.  Left Ear: Hearing normal.  Nose: Nose normal.  Eyes: Conjunctivae and lids are normal. Right eye exhibits no discharge. Left eye exhibits no discharge. No scleral icterus.  Cardiovascular: Normal rate and regular rhythm.   Pulmonary/Chest: Effort normal and breath sounds normal. No respiratory distress.  Musculoskeletal: Normal range of motion.  Neurological: He is alert and oriented to  person, place, and time.  Skin: Skin is intact. No rash noted.  Changes of tinea cruris and around abdomen around bellybutton. With classic satellite lesions  Psychiatric: He has a normal mood and affect. His speech is normal and behavior is normal. Judgment and thought content normal. Cognition and memory are normal.   reviewed labs especially glucose which was normal  Results for orders placed or performed in visit on 10/10/15  CBC with Differential/Platelet  Result Value Ref Range   WBC 9.3 3.4 - 10.8 x10E3/uL   RBC 5.06 4.14 - 5.80 x10E6/uL   Hemoglobin 14.7 12.6 - 17.7 g/dL   Hematocrit 44.3 37.5 - 51.0 %   MCV 88 79 - 97 fL   MCH 29.1 26.6 - 33.0 pg   MCHC 33.2 31.5 - 35.7 g/dL   RDW 12.9 12.3 - 15.4 %   Platelets 258 150 - 379 x10E3/uL   Neutrophils 62 %   Lymphs 27 %   Monocytes 9 %   Eos 2 %   Basos 0 %   Neutrophils Absolute 5.8 1.4 - 7.0 x10E3/uL   Lymphocytes Absolute 2.5 0.7 - 3.1 x10E3/uL   Monocytes Absolute 0.8 0.1 - 0.9 x10E3/uL   EOS (ABSOLUTE) 0.2 0.0 - 0.4 x10E3/uL   Basophils Absolute 0.0 0.0 - 0.2 x10E3/uL   Immature Granulocytes 0 %   Immature Grans (Abs) 0.0 0.0 - 0.1 x10E3/uL  Comprehensive metabolic panel  Result Value Ref Range   Glucose 84 65 - 99 mg/dL   BUN 16 6 - 24 mg/dL   Creatinine, Ser 1.07 0.76 - 1.27 mg/dL   GFR calc non Af Amer 86 >59 mL/min/1.73   GFR calc Af Amer 100 >59 mL/min/1.73   BUN/Creatinine Ratio 15 9 - 20   Sodium 144 134 - 144 mmol/L   Potassium 4.4 3.5 - 5.2 mmol/L   Chloride 102 96 - 106 mmol/L   CO2 23 18 - 29 mmol/L   Calcium 9.0 8.7 - 10.2 mg/dL   Total Protein 6.8 6.0 - 8.5 g/dL   Albumin 4.6 3.5 - 5.5 g/dL   Globulin, Total 2.2 1.5 - 4.5 g/dL   Albumin/Globulin Ratio 2.1 1.2 - 2.2   Bilirubin Total 0.5 0.0 - 1.2 mg/dL   Alkaline Phosphatase 70 39 - 117 IU/L   AST 15 0 - 40 IU/L   ALT 15 0 - 44 IU/L  Lipid Panel w/o Chol/HDL Ratio  Result Value Ref Range   Cholesterol, Total 192 100 - 199 mg/dL    Triglycerides 64 0 - 149 mg/dL   HDL 43 >39 mg/dL   VLDL Cholesterol Cal 13 5 - 40 mg/dL   LDL Calculated 136 (H) 0 - 99 mg/dL  TSH  Result Value Ref Range   TSH 0.925 0.450 - 4.500 uIU/mL      Assessment & Plan:   Problem List Items Addressed This Visit      Musculoskeletal and Integument   Tinea cruris   Relevant Medications   terbinafine (LAMISIL AT) 1 % cream   Other Relevant Orders   Bayer DCA Hb A1c Waived    Other Visit Diagnoses    Immunization due    -  Primary   Relevant Orders   Flu Vaccine QUAD 36+ mos PF IM (Fluarix & Fluzone Quad PF) (Completed)       Follow up plan: Return for As scheduled.

## 2016-03-02 ENCOUNTER — Other Ambulatory Visit: Payer: Self-pay

## 2016-05-18 ENCOUNTER — Encounter: Payer: Self-pay | Admitting: Emergency Medicine

## 2016-05-18 ENCOUNTER — Emergency Department
Admission: EM | Admit: 2016-05-18 | Discharge: 2016-05-18 | Disposition: A | Discharge status: Home / Self Care | Payer: BLUE CROSS/BLUE SHIELD | Attending: Emergency Medicine | Admitting: Emergency Medicine

## 2016-05-18 DIAGNOSIS — I10 Essential (primary) hypertension: Secondary | ICD-10-CM | POA: Insufficient documentation

## 2016-05-18 DIAGNOSIS — J111 Influenza due to unidentified influenza virus with other respiratory manifestations: Secondary | ICD-10-CM | POA: Insufficient documentation

## 2016-05-18 MED ORDER — BENZONATATE 100 MG PO CAPS: 100.0000 mg | ORAL_CAPSULE | Freq: Three times a day (TID) | ORAL | 0 refills | Status: AC | PRN

## 2016-05-18 MED ORDER — OSELTAMIVIR PHOSPHATE 75 MG PO CAPS: 75.0000 mg | ORAL_CAPSULE | Freq: Two times a day (BID) | ORAL | 0 refills | Status: AC

## 2016-05-18 NOTE — ED Provider Notes (Signed)
[facility] Emergency Department Provider Note  ____________________________________________  Time seen: Approximately 4:27 PM  I have reviewed the triage vital signs and the nursing notes.   HISTORY  Chief Complaint Nasal Congestion    HPI Joe Gutierrez is a 41 y.o. male presenting to the emergency department with headache, congestion, rhinorrhea, fatigue and myalgias for the past 3 days. Patient has not evaluated his temperature, but he has had chills and sweats. He has experienced increased sleep. He is tolerating fluids and food by mouth. Patient has numerous sick contacts in his work at a [employer]. Patient denies chest pain, chest tightness, shortness of breath, nausea, vomiting, diarrhea and abdominal pain. Patient has taken Tylenol but has attempted no other alleviating measures.   Past Medical History:  Diagnosis Date  . Hypertension   . Kidney stones   . Obesity     Patient Active Problem List   Diagnosis Date Noted  . Tinea cruris 10/29/2015  . Folliculitis 44/04/4740  . Obesity 08/27/2015  . Elevated BP 08/27/2015  . Occipital neuralgia 08/27/2015    Past Surgical History:  Procedure Laterality Date  . APPENDECTOMY    . LITHOTRIPSY      Prior to Admission medications   Medication Sig Start Date End Date Taking? Authorizing Provider  benzonatate (TESSALON PERLES) 100 MG capsule Take 1 capsule (100 mg total) by mouth 3 (three) times daily as needed for cough. 05/18/16 05/25/16  Lannie Fields, PA-C  cyclobenzaprine (FLEXERIL) 10 MG tablet Take 10 mg by mouth 3 (three) times daily as needed. 08/27/15   Historical Provider, MD  ibuprofen (ADVIL,MOTRIN) 800 MG tablet Take 1 tablet by mouth every 8 (eight) hours as needed. 08/27/15   Historical Provider, MD  oseltamivir (TAMIFLU) 75 MG capsule Take 1 capsule (75 mg total) by mouth 2 (two) times daily. 05/18/16 05/23/16  Lannie Fields, PA-C  terbinafine (LAMISIL AT) 1 % cream Apply 1  application topically 2 (two) times daily. 10/29/15   Guadalupe Maple, MD  triamcinolone cream (KENALOG) 0.1 % Apply 1 application topically 2 (two) times daily. 10/10/15   Kathrine Haddock, NP    Allergies Codeine and Sulfa antibiotics  Family History  Problem Relation Age of Onset  . Diabetes Mother   . Hypertension Mother   . Alcohol abuse Father   . Heart disease Father   . Hypertension Father   . Diabetes Maternal Grandmother   . Stroke Maternal Grandfather   . Hypertension Brother   . Asthma Daughter   . Heart disease Paternal Grandfather     Social History Social History  Substance Use Topics  . Smoking status: Never Smoker  . Smokeless tobacco: Never Used  . Alcohol use No   Review of Systems  Constitutional: Patient has had fever.  Eyes: No visual changes. No discharge ENT: Patient has had congestion.  Cardiovascular: no chest pain. Respiratory: Patient has had non-productive cough. No SOB. Gastrointestinal: No nausea, vomiting or diarrhea. Genitourinary: Negative for dysuria. No hematuria Musculoskeletal: Patient has had myalgias. Skin: Negative for rash, abrasions, lacerations, ecchymosis. Neurological: Negative for headaches, focal weakness or numbness. ____________________________________________   PHYSICAL EXAM:  VITAL SIGNS: ED Triage Vitals [05/18/16 1409]  Enc Vitals Group     BP (!) 141/91     Pulse Rate 79     Resp 16     Temp 99.1 F (37.3 C)     Temp Source Oral     SpO2 98 %  Weight 245 lb (111.1 kg)     Height 5\' 11"  (1.803 m)     Head Circumference      Peak Flow      Pain Score 5     Pain Loc      Pain Edu?      Excl. in [place]?     Constitutional: Alert and oriented. Patient is lying supine in bed.  Eyes: Conjunctivae are normal. PERRL. EOMI. Head: Atraumatic. ENT:      Ears: Tympanic membranes are injected bilaterally without evidence of effusion or purulent exudate. Bony landmarks are visualized bilaterally. No pain with  palpation at the tragus.      Nose: Nasal turbinates are edematous and erythematous. Copious rhinorrhea visualized.      Mouth/Throat: Mucous membranes are moist. Posterior pharynx is mildly erythematous. No tonsillar hypertrophy or purulent exudate. Uvula is midline. Neck: Full range of motion. No pain is elicited with flexion at the neck. Hematological/Lymphatic/Immunilogical: No cervical lymphadenopathy. Cardiovascular: Normal rate, regular rhythm. Normal S1 and S2.  Good peripheral circulation. Respiratory: Normal respiratory effort without tachypnea or retractions. Lungs CTAB. Good air entry to the bases with no decreased or absent breath sounds. Gastrointestinal: Bowel sounds 4 quadrants. Soft and nontender to palpation. No guarding or rigidity. No palpable masses. No distention. No CVA tenderness.  Skin:  Skin is warm, dry and intact. No rash noted. Psychiatric: Mood and affect are normal. Speech and behavior are normal. Patient exhibits appropriate insight and judgement. ____________________________________________   LABS (all labs ordered are listed, but only abnormal results are displayed)  Labs Reviewed - No data to display ____________________________________________  EKG   ____________________________________________  RADIOLOGY   No results found.  ____________________________________________    PROCEDURES  Procedure(s) performed:    Procedures    Medications - No data to display   ____________________________________________   INITIAL IMPRESSION / ASSESSMENT AND PLAN / ED COURSE  Pertinent labs & imaging results that were available during my care of the patient were reviewed by me and considered in my medical decision making (see chart for details).  Review of the [place] CSRS was performed in accordance of the [place] prior to dispensing any controlled drugs.    Assessment and Plan:  Influenza: Patient presents to the emergency department with  headache, rhinorrhea, congestion, myalgias, fatigue and chills/sweats. Symptoms are consistent with influenza. Tamiflu was prescribed at discharge. Rest and hydration were encouraged. Patient was advised to follow-up with his primary care provider in one week. Physical exam and vital signs are reassuring at this time. All patient questions were answered.  ____________________________________________  FINAL CLINICAL IMPRESSION(S) / ED DIAGNOSES  Final diagnoses:  Influenza      NEW MEDICATIONS STARTED DURING THIS VISIT:  New Prescriptions   BENZONATATE (TESSALON PERLES) 100 MG CAPSULE    Take 1 capsule (100 mg total) by mouth 3 (three) times daily as needed for cough.   OSELTAMIVIR (TAMIFLU) 75 MG CAPSULE    Take 1 capsule (75 mg total) by mouth 2 (two) times daily.        This chart was dictated using voice recognition software/Dragon. Despite best efforts to proofread, errors can occur which can change the meaning. Any change was purely unintentional.    Lannie Fields, PA-C 05/18/16 [place], MD 05/18/16 2120

## 2016-05-18 NOTE — ED Triage Notes (Signed)
PT reports headache, body aches, running nose since Friday.

## 2016-05-18 NOTE — ED Notes (Signed)
Pt c/o generalized body aches, fever, and runny nose since Friday. Pt states has been exposed to the flu. Pt is in NAD at this time. A&O x 4, skin warm, dry, and intact at this time.

## 2016-10-10 ENCOUNTER — Encounter: Payer: Self-pay | Admitting: Unknown Physician Specialty

## 2016-11-10 ENCOUNTER — Encounter: Payer: Self-pay | Admitting: Unknown Physician Specialty

## 2016-11-10 ENCOUNTER — Ambulatory Visit (INDEPENDENT_AMBULATORY_CARE_PROVIDER_SITE_OTHER): Payer: 59 | Admitting: Unknown Physician Specialty

## 2016-11-10 VITALS — BP 158/99 | HR 61 | Temp 98.2°F | Ht 69.3 in | Wt 248.3 lb

## 2016-11-10 DIAGNOSIS — F418 Other specified anxiety disorders: Secondary | ICD-10-CM

## 2016-11-10 DIAGNOSIS — E669 Obesity, unspecified: Secondary | ICD-10-CM | POA: Diagnosis not present

## 2016-11-10 DIAGNOSIS — IMO0001 Reserved for inherently not codable concepts without codable children: Secondary | ICD-10-CM

## 2016-11-10 DIAGNOSIS — Z0000 Encounter for general adult medical examination without abnormal findings: Secondary | ICD-10-CM | POA: Diagnosis not present

## 2016-11-10 DIAGNOSIS — Z6836 Body mass index (BMI) 36.0-36.9, adult: Secondary | ICD-10-CM | POA: Diagnosis not present

## 2016-11-10 DIAGNOSIS — I10 Essential (primary) hypertension: Secondary | ICD-10-CM | POA: Diagnosis not present

## 2016-11-10 DIAGNOSIS — Z23 Encounter for immunization: Secondary | ICD-10-CM | POA: Diagnosis not present

## 2016-11-10 MED ORDER — LISINOPRIL 5 MG PO TABS: 5.0000 mg | ORAL_TABLET | Freq: Every day | ORAL | 3 refills | Status: DC

## 2016-11-10 MED ORDER — CITALOPRAM HYDROBROMIDE 20 MG PO TABS: 20.0000 mg | ORAL_TABLET | Freq: Every day | ORAL | 3 refills | Status: DC

## 2016-11-10 NOTE — Patient Instructions (Signed)

## 2016-11-10 NOTE — Assessment & Plan Note (Addendum)
Start Citalopram 20 mg daily.  Encouraged exercise and healthy eating.

## 2016-11-10 NOTE — Assessment & Plan Note (Addendum)
Pt also snores and has periods where he stops breathing.  Encouraged diet and exercise.  Order sleep study

## 2016-11-10 NOTE — Progress Notes (Signed)
BP (!) 158/99   Pulse 61   Temp 98.2 F (36.8 C)   Ht 5' 9.3" (1.76 m)   Wt 248 lb 4.8 oz (112.6 kg)   SpO2 97%   BMI 36.35 kg/m    Subjective:    Patient ID: Joe Gutierrez, male    DOB: 1975-02-21, 41 y.o.   MRN: [id number]  HPI: Joe Gutierrez is a 41 y.o. male  Chief Complaint  Patient presents with  . Annual Exam   Depression Pt states he is getting angry lately and difficult to control.  Everything makes him angry lately.  He is unable to ID specific triggers making him angry.   Depression screen [facility] 2/9 11/10/2016 10/10/2015  Decreased Interest 1 0  Down, Depressed, Hopeless 1 0  PHQ - 2 Score 2 0  Altered sleeping 1 -  Tired, decreased energy 1 -  Change in appetite 1 -  Feeling bad or failure about yourself  1 -  Trouble concentrating 1 -  Moving slowly or fidgety/restless 0 -  Suicidal thoughts 1 -  PHQ-9 Score 8 -  Difficult doing work/chores Somewhat difficult -   Hypertension Borderline BP at the doctor's office.  Complains of SOB and evaluation by cardiology.  Told heart was "a little" enlarged.  Not checking BP outside the office   Sleep apnea  Snores at night with apneic episodes.  Falls asleep at the wheel.    Social History   Social History  . Marital status: Married    Spouse name: N/A  . Number of children: N/A  . Years of education: N/A   Occupational History  . Not on file.   Social History Main Topics  . Smoking status: Never Smoker  . Smokeless tobacco: Never Used  . Alcohol use No  . Drug use: No  . Sexual activity: Yes   Other Topics Concern  . Not on file   Social History Narrative  . No narrative on file   Family History  Problem Relation Age of Onset  . Diabetes Mother   . Hypertension Mother   . Alcohol abuse Father   . Heart disease Father   . Hypertension Father   . Diabetes Maternal Grandmother   . Stroke Maternal Grandfather   . Hypertension Brother   . Asthma Daughter   . Heart disease Paternal  Grandfather    Past Medical History:  Diagnosis Date  . Hypertension   . Kidney stones   . Obesity    Past Surgical History:  Procedure Laterality Date  . APPENDECTOMY    . LITHOTRIPSY     Relevant past medical, surgical, family and social history reviewed and updated as indicated. Interim medical history since our last visit reviewed. Allergies and medications reviewed and updated.  Review of Systems  Constitutional: Negative.   HENT: Negative.   Eyes: Negative.   Respiratory:       States always SOB.  Was told it was because his heart was enlarged.  States he had stress test and echo through cardiology 3 years ago  Cardiovascular: Negative.   Gastrointestinal: Negative.   Endocrine: Negative.   Genitourinary: Negative.   Skin: Negative.   Allergic/Immunologic: Negative.   Neurological: Negative.   Hematological: Negative.   Psychiatric/Behavioral: Negative.     Per HPI unless specifically indicated above     Objective:    BP (!) 158/99   Pulse 61   Temp 98.2 F (36.8 C)   Ht 5' 9.3" (1.76  m)   Wt 248 lb 4.8 oz (112.6 kg)   SpO2 97%   BMI 36.35 kg/m   Wt Readings from Last 3 Encounters:  11/10/16 248 lb 4.8 oz (112.6 kg)  05/18/16 245 lb (111.1 kg)  10/29/15 247 lb (112 kg)    Physical Exam  Constitutional: He is oriented to person, place, and time. He appears well-developed and well-nourished.  HENT:  Head: Normocephalic.  Right Ear: Tympanic membrane, external ear and ear canal normal.  Left Ear: Tympanic membrane, external ear and ear canal normal.  Mouth/Throat: Uvula is midline, oropharynx is clear and moist and mucous membranes are normal.  Eyes: Pupils are equal, round, and reactive to light.  Cardiovascular: Normal rate, regular rhythm and normal heart sounds.  Exam reveals no gallop and no friction rub.   No murmur heard. Pulmonary/Chest: Effort normal and breath sounds normal. No respiratory distress.  Abdominal: Soft. Bowel sounds are normal.  He exhibits no distension. There is no tenderness.  Musculoskeletal: Normal range of motion.  Neurological: He is alert and oriented to person, place, and time. He has normal reflexes.  Skin: Skin is warm and dry.  Psychiatric: He has a normal mood and affect. His behavior is normal. Judgment and thought content normal.   EKG NSR.  Left axis but no STTW changes.    Results for orders placed or performed in visit on 10/29/15  Bayer DCA Hb A1c Waived  Result Value Ref Range   Bayer DCA Hb A1c Waived 5.4 <7.0 %      Assessment & Plan:   Problem List Items Addressed This Visit      Unprioritized   Depression with anxiety    Start Citalopram 20 mg daily.  Encouraged exercise and healthy eating.         Relevant Orders   Ambulatory referral to Sleep Studies   Essential hypertension, benign    Pt states everytime he goes to the doctor it is high.  Start Lisinopril 5 mg daily.  Recheck in 1 month      Relevant Medications   lisinopril (PRINIVIL,ZESTRIL) 5 MG tablet   Other Relevant Orders   EKG 12-Lead (Completed)   Ambulatory referral to Sleep Studies   Obesity, Class II, BMI 35-39.9, with comorbidity    Pt also snores and has periods where he stops breathing.  Encouraged diet and exercise.  Order sleep study       Relevant Orders   Ambulatory referral to Sleep Studies    Other Visit Diagnoses    Need for influenza vaccination    -  Primary   Relevant Orders   Flu Vaccine QUAD 36+ mos IM (Completed)   Annual physical exam       Relevant Orders   CBC with Differential/Platelet   Comprehensive metabolic panel   Lipid Panel w/o Chol/HDL Ratio   TSH   VITAMIN D 25 Hydroxy (Vit-D Deficiency, Fractures)       Follow up plan: Return in about 4 weeks (around 12/08/2016).

## 2016-11-10 NOTE — Assessment & Plan Note (Deleted)
Pt also snores and has periods where he stops breathing.  Encouraged diet and exercise.  Order stress test

## 2016-11-10 NOTE — Assessment & Plan Note (Addendum)
Pt states everytime he goes to the doctor it is high.  Start Lisinopril 5 mg daily.  Recheck in 1 month

## 2016-11-11 ENCOUNTER — Encounter: Payer: Self-pay | Admitting: Unknown Physician Specialty

## 2016-11-11 LAB — CBC WITH DIFFERENTIAL/PLATELET
BASOS: 0 %
Basophils Absolute: 0 10*3/uL (ref 0.0–0.2)
EOS (ABSOLUTE): 0.2 10*3/uL (ref 0.0–0.4)
EOS: 3 %
HEMATOCRIT: 44.7 % (ref 37.5–51.0)
Hemoglobin: 14.8 g/dL (ref 13.0–17.7)
IMMATURE GRANULOCYTES: 0 %
Immature Grans (Abs): 0 10*3/uL (ref 0.0–0.1)
LYMPHS ABS: 2.4 10*3/uL (ref 0.7–3.1)
Lymphs: 25 %
MCH: 29 pg (ref 26.6–33.0)
MCHC: 33.1 g/dL (ref 31.5–35.7)
MCV: 88 fL (ref 79–97)
MONOS ABS: 0.8 10*3/uL (ref 0.1–0.9)
Monocytes: 8 %
NEUTROS ABS: 5.9 10*3/uL (ref 1.4–7.0)
NEUTROS PCT: 64 %
Platelets: 221 10*3/uL (ref 150–379)
RBC: 5.11 x10E6/uL (ref 4.14–5.80)
RDW: 13.1 % (ref 12.3–15.4)
WBC: 9.3 10*3/uL (ref 3.4–10.8)

## 2016-11-11 LAB — COMPREHENSIVE METABOLIC PANEL
ALT: 14 IU/L (ref 0–44)
AST: 17 IU/L (ref 0–40)
Albumin/Globulin Ratio: 1.9 (ref 1.2–2.2)
Albumin: 4.3 g/dL (ref 3.5–5.5)
Alkaline Phosphatase: 78 IU/L (ref 39–117)
BILIRUBIN TOTAL: 0.2 mg/dL (ref 0.0–1.2)
BUN/Creatinine Ratio: 15 (ref 9–20)
BUN: 13 mg/dL (ref 6–24)
CALCIUM: 8.7 mg/dL (ref 8.7–10.2)
CHLORIDE: 104 mmol/L (ref 96–106)
CO2: 23 mmol/L (ref 20–29)
Creatinine, Ser: 0.85 mg/dL (ref 0.76–1.27)
GFR, EST AFRICAN AMERICAN: 125 mL/min/{1.73_m2} (ref >59)
GFR, EST NON AFRICAN AMERICAN: 108 mL/min/{1.73_m2} (ref >59)
GLOBULIN, TOTAL: 2.3 g/dL (ref 1.5–4.5)
Glucose: 105 mg/dL — ABNORMAL HIGH (ref 65–99)
POTASSIUM: 3.9 mmol/L (ref 3.5–5.2)
Sodium: 141 mmol/L (ref 134–144)
TOTAL PROTEIN: 6.6 g/dL (ref 6.0–8.5)

## 2016-11-11 LAB — VITAMIN D 25 HYDROXY (VIT D DEFICIENCY, FRACTURES): VIT D 25 HYDROXY: 26.8 ng/mL — AB (ref 30.0–100.0)

## 2016-11-11 LAB — LIPID PANEL W/O CHOL/HDL RATIO
Cholesterol, Total: 188 mg/dL (ref 100–199)
HDL: 33 mg/dL — ABNORMAL LOW (ref >39)
LDL Calculated: 76 mg/dL (ref 0–99)
Triglycerides: 397 mg/dL — ABNORMAL HIGH (ref 0–149)
VLDL Cholesterol Cal: 79 mg/dL — ABNORMAL HIGH (ref 5–40)

## 2016-11-11 LAB — TSH: TSH: 1.12 u[IU]/mL (ref 0.450–4.500)

## 2016-12-10 ENCOUNTER — Encounter: Payer: Self-pay | Admitting: Unknown Physician Specialty

## 2016-12-10 ENCOUNTER — Ambulatory Visit (INDEPENDENT_AMBULATORY_CARE_PROVIDER_SITE_OTHER): Payer: 59 | Admitting: Unknown Physician Specialty

## 2016-12-10 DIAGNOSIS — I10 Essential (primary) hypertension: Secondary | ICD-10-CM

## 2016-12-10 DIAGNOSIS — F418 Other specified anxiety disorders: Secondary | ICD-10-CM

## 2016-12-10 MED ORDER — PAROXETINE HCL 20 MG PO TABS: 20.0000 mg | ORAL_TABLET | Freq: Every day | ORAL | 1 refills | Status: DC

## 2016-12-10 NOTE — Assessment & Plan Note (Signed)
Mild improvement with Citalopram.  Change to Paroxetine and work on CPAP with sleep apnea possibly a contributor

## 2016-12-10 NOTE — Assessment & Plan Note (Signed)
Stable, continue present medications.   

## 2016-12-10 NOTE — Progress Notes (Signed)
BP 131/87   Pulse 76   Temp 98 F (36.7 C)   Wt 246 lb 9.6 oz (111.9 kg)   SpO2 98%   BMI 36.10 kg/m    Subjective:    Patient ID: Joe Gutierrez, male    DOB: Jun 03, 1975, 41 y.o.   MRN: [id number]  HPI: Joe Gutierrez is a 41 y.o. male  Chief Complaint  Patient presents with  . Anxiety    4 week f/up  . Hypertension    4 week f/up    Depression Started Citalopram 1 month ago.  Pt states he is better than he was but still getting angry.  However, has not gotten his CPAP yet.  Pt does not think the worsening on PHQ 9 is an accurate reflection.   No bipolar in the family but brother with anxiety.  Father has had a temper.  Only 3 yes answers on MDQ Depression screen [facility] 2/9 12/10/2016 11/10/2016 10/10/2015  Decreased Interest 2 1 0  Down, Depressed, Hopeless 2 1 0  PHQ - 2 Score 4 2 0  Altered sleeping 3 1 -  Tired, decreased energy 3 1 -  Change in appetite 0 1 -  Feeling bad or failure about yourself  2 1 -  Trouble concentrating 2 1 -  Moving slowly or fidgety/restless 2 0 -  Suicidal thoughts 0 1 -  PHQ-9 Score 16 8 -  Difficult doing work/chores Very difficult Somewhat difficult -   Hypertension Using medications without difficulty Average home BPs not checking   No problems or lightheadedness No chest pain with exertion or shortness of breath No Edema  Relevant past medical, surgical, family and social history reviewed and updated as indicated. Interim medical history since our last visit reviewed. Allergies and medications reviewed and updated.  Review of Systems  Per HPI unless specifically indicated above     Objective:    BP 131/87   Pulse 76   Temp 98 F (36.7 C)   Wt 246 lb 9.6 oz (111.9 kg)   SpO2 98%   BMI 36.10 kg/m   Wt Readings from Last 3 Encounters:  12/10/16 246 lb 9.6 oz (111.9 kg)  11/10/16 248 lb 4.8 oz (112.6 kg)  05/18/16 245 lb (111.1 kg)    Physical Exam  Constitutional: He is oriented to person, place, and time. He  appears well-developed and well-nourished. No distress.  HENT:  Head: Normocephalic and atraumatic.  Eyes: Conjunctivae and lids are normal. Right eye exhibits no discharge. Left eye exhibits no discharge. No scleral icterus.  Neck: Normal range of motion. Neck supple. No JVD present. Carotid bruit is not present.  Cardiovascular: Normal rate, regular rhythm and normal heart sounds.   Pulmonary/Chest: Effort normal and breath sounds normal. No respiratory distress.  Abdominal: Normal appearance. There is no splenomegaly or hepatomegaly.  Musculoskeletal: Normal range of motion.  Neurological: He is alert and oriented to person, place, and time.  Skin: Skin is warm, dry and intact. No rash noted. No pallor.  Psychiatric: He has a normal mood and affect. His behavior is normal. Judgment and thought content normal.    Results for orders placed or performed in visit on 11/10/16  CBC with Differential/Platelet  Result Value Ref Range   WBC 9.3 3.4 - 10.8 x10E3/uL   RBC 5.11 4.14 - 5.80 x10E6/uL   Hemoglobin 14.8 13.0 - 17.7 g/dL   Hematocrit 44.7 37.5 - 51.0 %   MCV 88 79 - 97 fL  MCH 29.0 26.6 - 33.0 pg   MCHC 33.1 31.5 - 35.7 g/dL   RDW 13.1 12.3 - 15.4 %   Platelets 221 150 - 379 x10E3/uL   Neutrophils 64 Not Estab. %   Lymphs 25 Not Estab. %   Monocytes 8 Not Estab. %   Eos 3 Not Estab. %   Basos 0 Not Estab. %   Neutrophils Absolute 5.9 1.4 - 7.0 x10E3/uL   Lymphocytes Absolute 2.4 0.7 - 3.1 x10E3/uL   Monocytes Absolute 0.8 0.1 - 0.9 x10E3/uL   EOS (ABSOLUTE) 0.2 0.0 - 0.4 x10E3/uL   Basophils Absolute 0.0 0.0 - 0.2 x10E3/uL   Immature Granulocytes 0 Not Estab. %   Immature Grans (Abs) 0.0 0.0 - 0.1 x10E3/uL  Comprehensive metabolic panel  Result Value Ref Range   Glucose 105 (H) 65 - 99 mg/dL   BUN 13 6 - 24 mg/dL   Creatinine, Ser 0.85 0.76 - 1.27 mg/dL   GFR calc non Af Amer 108 >59 mL/min/1.73   GFR calc Af Amer 125 >59 mL/min/1.73   BUN/Creatinine Ratio 15 9 - 20     Sodium 141 134 - 144 mmol/L   Potassium 3.9 3.5 - 5.2 mmol/L   Chloride 104 96 - 106 mmol/L   CO2 23 20 - 29 mmol/L   Calcium 8.7 8.7 - 10.2 mg/dL   Total Protein 6.6 6.0 - 8.5 g/dL   Albumin 4.3 3.5 - 5.5 g/dL   Globulin, Total 2.3 1.5 - 4.5 g/dL   Albumin/Globulin Ratio 1.9 1.2 - 2.2   Bilirubin Total 0.2 0.0 - 1.2 mg/dL   Alkaline Phosphatase 78 39 - 117 IU/L   AST 17 0 - 40 IU/L   ALT 14 0 - 44 IU/L  Lipid Panel w/o Chol/HDL Ratio  Result Value Ref Range   Cholesterol, Total 188 100 - 199 mg/dL   Triglycerides 397 (H) 0 - 149 mg/dL   HDL 33 (L) >39 mg/dL   VLDL Cholesterol Cal 79 (H) 5 - 40 mg/dL   LDL Calculated 76 0 - 99 mg/dL  TSH  Result Value Ref Range   TSH 1.120 0.450 - 4.500 uIU/mL  VITAMIN D 25 Hydroxy (Vit-D Deficiency, Fractures)  Result Value Ref Range   Vit D, 25-Hydroxy 26.8 (L) 30.0 - 100.0 ng/mL      Assessment & Plan:   Problem List Items Addressed This Visit      Unprioritized   Depression with anxiety    Mild improvement with Citalopram.  Change to Paroxetine and work on CPAP with sleep apnea possibly a contributor      Relevant Medications   PARoxetine (PAXIL) 20 MG tablet   Essential hypertension, benign    Stable, continue present medications.            Follow up plan: Return in about 4 weeks (around 01/07/2017).

## 2017-01-13 ENCOUNTER — Encounter: Payer: Self-pay | Admitting: Unknown Physician Specialty

## 2017-01-13 ENCOUNTER — Ambulatory Visit (INDEPENDENT_AMBULATORY_CARE_PROVIDER_SITE_OTHER): Payer: 59 | Admitting: Unknown Physician Specialty

## 2017-01-13 DIAGNOSIS — G4730 Sleep apnea, unspecified: Secondary | ICD-10-CM | POA: Insufficient documentation

## 2017-01-13 DIAGNOSIS — G4733 Obstructive sleep apnea (adult) (pediatric): Secondary | ICD-10-CM | POA: Diagnosis not present

## 2017-01-13 DIAGNOSIS — I10 Essential (primary) hypertension: Secondary | ICD-10-CM | POA: Diagnosis not present

## 2017-01-13 DIAGNOSIS — F418 Other specified anxiety disorders: Secondary | ICD-10-CM

## 2017-01-13 MED ORDER — BUSPIRONE HCL 5 MG PO TABS: 5.0000 mg | ORAL_TABLET | Freq: Three times a day (TID) | ORAL | 1 refills | Status: DC

## 2017-01-13 NOTE — Assessment & Plan Note (Addendum)
Significant.  Looking for solutions which are affordable

## 2017-01-13 NOTE — Progress Notes (Signed)
BP 121/81   Pulse 73   Temp 98.2 F (36.8 C) (Oral)   Wt 251 lb 6.4 oz (114 kg)   SpO2 96%   BMI 36.80 kg/m    Subjective:    Patient ID: Joe Gutierrez, male    DOB: 04-Dec-1975, 41 y.o.   MRN: [id number]  HPI: Joe Gutierrez is a 41 y.o. male  Chief Complaint  Patient presents with  . Anxiety    4 week f/up     Depression Pt is here to f/u anxiety and depression.  Pt states he feels Paroxetine 20 mg works better than Citalopram.   Depression screen [facility] 2/9 01/13/2017 12/10/2016 11/10/2016 10/10/2015  Decreased Interest 1 2 1  0  Down, Depressed, Hopeless 1 2 1  0  PHQ - 2 Score 2 4 2  0  Altered sleeping 2 3 1  -  Tired, decreased energy 2 3 1  -  Change in appetite 1 0 1 -  Feeling bad or failure about yourself  1 2 1  -  Trouble concentrating 1 2 1  -  Moving slowly or fidgety/restless 0 2 0 -  Suicidal thoughts 1 0 1 -  PHQ-9 Score 10 16 8  -  Difficult doing work/chores - Very difficult Somewhat difficult -   Sleep apnea Pt has significant sleep apnea.  He is finding the deductible for a CPAP is $1,000.  Still working out solutions  Hypertension Using medications without difficulty Average home BPs Not checking   No problems or lightheadedness No chest pain with exertion or shortness of breath No Edema    Social History   Socioeconomic History  . Marital status: Married    Spouse name: Not on file  . Number of children: Not on file  . Years of education: Not on file  . Highest education level: Not on file  Social Needs  . Financial resource strain: Not on file  . Food insecurity - worry: Not on file  . Food insecurity - inability: Not on file  . Transportation needs - medical: Not on file  . Transportation needs - non-medical: Not on file  Occupational History  . Not on file  Tobacco Use  . Smoking status: Never Smoker  . Smokeless tobacco: Never Used  Substance and Sexual Activity  . Alcohol use: No  . Drug use: No  . Sexual activity: Yes    Other Topics Concern  . Not on file  Social History Narrative  . Not on file   Family History  Problem Relation Age of Onset  . Diabetes Mother   . Hypertension Mother   . Alcohol abuse Father   . Heart disease Father   . Hypertension Father   . Diabetes Maternal Grandmother   . Stroke Maternal Grandfather   . Hypertension Brother   . Asthma Daughter   . Heart disease Paternal Grandfather    Past Medical History:  Diagnosis Date  . Hypertension   . Kidney stones   . Obesity    Past Surgical History:  Procedure Laterality Date  . APPENDECTOMY    . LITHOTRIPSY      Relevant past medical, surgical, family and social history reviewed and updated as indicated. Interim medical history since our last visit reviewed. Allergies and medications reviewed and updated.  Review of Systems  Constitutional: Negative.   Respiratory: Negative.   Cardiovascular: Negative.     Per HPI unless specifically indicated above     Objective:    BP 121/81   Pulse  73   Temp 98.2 F (36.8 C) (Oral)   Wt 251 lb 6.4 oz (114 kg)   SpO2 96%   BMI 36.80 kg/m   Wt Readings from Last 3 Encounters:  01/13/17 251 lb 6.4 oz (114 kg)  12/10/16 246 lb 9.6 oz (111.9 kg)  11/10/16 248 lb 4.8 oz (112.6 kg)    Physical Exam  Constitutional: He is oriented to person, place, and time. He appears well-developed and well-nourished. No distress.  HENT:  Head: Normocephalic and atraumatic.  Eyes: Conjunctivae and lids are normal. Right eye exhibits no discharge. Left eye exhibits no discharge. No scleral icterus.  Neck: Normal range of motion. Neck supple. No JVD present. Carotid bruit is not present.  Cardiovascular: Normal rate, regular rhythm and normal heart sounds.  Pulmonary/Chest: Effort normal and breath sounds normal. No respiratory distress.  Abdominal: Normal appearance. There is no splenomegaly or hepatomegaly.  Musculoskeletal: Normal range of motion.  Neurological: He is alert and  oriented to person, place, and time.  Skin: Skin is warm, dry and intact. No rash noted. No pallor.  Psychiatric: He has a normal mood and affect. His behavior is normal. Judgment and thought content normal.    Results for orders placed or performed in visit on 11/10/16  CBC with Differential/Platelet  Result Value Ref Range   WBC 9.3 3.4 - 10.8 x10E3/uL   RBC 5.11 4.14 - 5.80 x10E6/uL   Hemoglobin 14.8 13.0 - 17.7 g/dL   Hematocrit 44.7 37.5 - 51.0 %   MCV 88 79 - 97 fL   MCH 29.0 26.6 - 33.0 pg   MCHC 33.1 31.5 - 35.7 g/dL   RDW 13.1 12.3 - 15.4 %   Platelets 221 150 - 379 x10E3/uL   Neutrophils 64 Not Estab. %   Lymphs 25 Not Estab. %   Monocytes 8 Not Estab. %   Eos 3 Not Estab. %   Basos 0 Not Estab. %   Neutrophils Absolute 5.9 1.4 - 7.0 x10E3/uL   Lymphocytes Absolute 2.4 0.7 - 3.1 x10E3/uL   Monocytes Absolute 0.8 0.1 - 0.9 x10E3/uL   EOS (ABSOLUTE) 0.2 0.0 - 0.4 x10E3/uL   Basophils Absolute 0.0 0.0 - 0.2 x10E3/uL   Immature Granulocytes 0 Not Estab. %   Immature Grans (Abs) 0.0 0.0 - 0.1 x10E3/uL  Comprehensive metabolic panel  Result Value Ref Range   Glucose 105 (H) 65 - 99 mg/dL   BUN 13 6 - 24 mg/dL   Creatinine, Ser 0.85 0.76 - 1.27 mg/dL   GFR calc non Af Amer 108 >59 mL/min/1.73   GFR calc Af Amer 125 >59 mL/min/1.73   BUN/Creatinine Ratio 15 9 - 20   Sodium 141 134 - 144 mmol/L   Potassium 3.9 3.5 - 5.2 mmol/L   Chloride 104 96 - 106 mmol/L   CO2 23 20 - 29 mmol/L   Calcium 8.7 8.7 - 10.2 mg/dL   Total Protein 6.6 6.0 - 8.5 g/dL   Albumin 4.3 3.5 - 5.5 g/dL   Globulin, Total 2.3 1.5 - 4.5 g/dL   Albumin/Globulin Ratio 1.9 1.2 - 2.2   Bilirubin Total 0.2 0.0 - 1.2 mg/dL   Alkaline Phosphatase 78 39 - 117 IU/L   AST 17 0 - 40 IU/L   ALT 14 0 - 44 IU/L  Lipid Panel w/o Chol/HDL Ratio  Result Value Ref Range   Cholesterol, Total 188 100 - 199 mg/dL   Triglycerides 397 (H) 0 - 149 mg/dL   HDL 33 (  L) >39 mg/dL   VLDL Cholesterol Cal 79 (H) 5 - 40  mg/dL   LDL Calculated 76 0 - 99 mg/dL  TSH  Result Value Ref Range   TSH 1.120 0.450 - 4.500 uIU/mL  VITAMIN D 25 Hydroxy (Vit-D Deficiency, Fractures)  Result Value Ref Range   Vit D, 25-Hydroxy 26.8 (L) 30.0 - 100.0 ng/mL      Assessment & Plan:   Problem List Items Addressed This Visit      Unprioritized   Depression with anxiety    Continue Paxil, I think if sleep apnea is treated this will get better.  In the meantime, try Buspar 5 mg TID      Relevant Medications   busPIRone (BUSPAR) 5 MG tablet   Essential hypertension, benign    Stable, continue present medications.        Sleep apnea    Significant.  Looking for solutions which are affordable          Follow up plan: Return in about 4 weeks (around 02/10/2017).

## 2017-01-13 NOTE — Assessment & Plan Note (Signed)
Stable, continue present medications.   

## 2017-01-13 NOTE — Assessment & Plan Note (Addendum)
Continue Paxil, I think if sleep apnea is treated this will get better.  In the meantime, try Buspar 5 mg TID

## 2017-02-12 ENCOUNTER — Encounter: Payer: Self-pay | Admitting: Family Medicine

## 2017-02-12 ENCOUNTER — Ambulatory Visit (INDEPENDENT_AMBULATORY_CARE_PROVIDER_SITE_OTHER): Payer: 59 | Admitting: Family Medicine

## 2017-02-12 VITALS — BP 132/91 | HR 77 | Temp 98.4°F | Wt 248.7 lb

## 2017-02-12 DIAGNOSIS — J020 Streptococcal pharyngitis: Secondary | ICD-10-CM | POA: Diagnosis not present

## 2017-02-12 LAB — RAPID STREP SCREEN (MED CTR MEBANE ONLY): Strep Gp A Ag, IA W/Reflex: POSITIVE — AB

## 2017-02-12 MED ORDER — AMOXICILLIN 875 MG PO TABS: 875.0000 mg | ORAL_TABLET | Freq: Two times a day (BID) | ORAL | 0 refills | Status: DC

## 2017-02-12 NOTE — Progress Notes (Signed)
   BP (!) 132/91   Pulse 77   Temp 98.4 F (36.9 C) (Oral)   Wt 248 lb 11.2 oz (112.8 kg)   SpO2 96%   BMI 36.41 kg/m    Subjective:    Patient ID: Joe Gutierrez, male    DOB: [birth year], 41 y.o.   MRN: [id number]  HPI: Joe Gutierrez is a 41 y.o. male  Chief Complaint  Patient presents with  . Sore Throat    pt states his throat has been hurting since yesterday morning   Sore throat, fatigue, and body aches x 1 day. Denies known fevers, chills, sweats, congestion, cough. Trying tylenol cold and sinus with minimal relief. No known sick contacts, no hx of strep infections.   Relevant past medical, surgical, family and social history reviewed and updated as indicated. Interim medical history since our last visit reviewed. Allergies and medications reviewed and updated.  Review of Systems  Constitutional: Positive for fatigue.  HENT: Positive for sore throat.   Eyes: Negative.   Respiratory: Negative.   Cardiovascular: Negative.   Gastrointestinal: Negative.   Genitourinary: Negative.   Musculoskeletal: Positive for myalgias.  Skin: Negative for rash.  Neurological: Negative.   Psychiatric/Behavioral: Negative.     Per HPI unless specifically indicated above     Objective:    BP (!) 132/91   Pulse 77   Temp 98.4 F (36.9 C) (Oral)   Wt 248 lb 11.2 oz (112.8 kg)   SpO2 96%   BMI 36.41 kg/m   Wt Readings from Last 3 Encounters:  02/12/17 248 lb 11.2 oz (112.8 kg)  01/13/17 251 lb 6.4 oz (114 kg)  12/10/16 246 lb 9.6 oz (111.9 kg)    Physical Exam  Constitutional: He is oriented to person, place, and time. He appears well-developed and well-nourished. No distress.  HENT:  Head: Atraumatic.  Mouth/Throat: Oropharyngeal exudate present.  Oropharynx erythematous and edematous b/l with pustular exudates  Eyes: Conjunctivae are normal. Pupils are equal, round, and reactive to light. No scleral icterus.  Neck: Normal range of motion. Neck supple.    Cardiovascular: Normal rate and normal heart sounds.  Pulmonary/Chest: Effort normal and breath sounds normal. No respiratory distress. He has no wheezes.  Musculoskeletal: Normal range of motion.  Lymphadenopathy:    He has cervical adenopathy.  Neurological: He is alert and oriented to person, place, and time.  Skin: Skin is warm and dry.  Nursing note and vitals reviewed.   Results for orders placed or performed in visit on 02/12/17  Rapid Strep Screen (Not at [facility])  Result Value Ref Range   Strep Gp A Ag, IA W/Reflex Positive (A) Negative      Assessment & Plan:   Problem List Items Addressed This Visit    None    Visit Diagnoses    Strep pharyngitis    -  Primary   Rapid strep positive. Will treat with amoxil, ibuprofen and tylenol, throat sprays, rest. Work note given. F/u if no improvement, discussed contact precautions   Relevant Orders   Rapid Strep Screen (Not at [facility]) (Completed)       Follow up plan: Return if symptoms worsen or fail to improve.

## 2017-02-15 NOTE — Patient Instructions (Signed)
Follow up as needed

## 2017-02-16 ENCOUNTER — Encounter: Payer: Self-pay | Admitting: Unknown Physician Specialty

## 2017-02-16 ENCOUNTER — Ambulatory Visit (INDEPENDENT_AMBULATORY_CARE_PROVIDER_SITE_OTHER): Payer: 59 | Admitting: Unknown Physician Specialty

## 2017-02-16 DIAGNOSIS — F418 Other specified anxiety disorders: Secondary | ICD-10-CM | POA: Diagnosis not present

## 2017-02-16 MED ORDER — BUSPIRONE HCL 10 MG PO TABS: 10.0000 mg | ORAL_TABLET | Freq: Three times a day (TID) | ORAL | 2 refills | Status: DC

## 2017-02-16 NOTE — Assessment & Plan Note (Signed)
Continue with Paxil.  Increase dose of Buspar to 10 mg twice a day.

## 2017-02-16 NOTE — Progress Notes (Signed)
BP (!) 134/96 (BP Location: Left Arm, Cuff Size: Large)   Pulse 61   Temp 97.6 F (36.4 C)   Wt 250 lb (113.4 kg)   SpO2 98%   BMI 36.60 kg/m    Subjective:    Patient ID: Joe Gutierrez, male    DOB: [birth year], 41 y.o.   MRN: [id number]  HPI: Joe Gutierrez is a 41 y.o. male  Chief Complaint  Patient presents with  . Anxiety    4 week f/up   States he seems to be better.  Taking Buspar twice a day which helps.  States he had a rough week.  Sleep apnea in the process of being treated through dentistry.   Depression screen [facility] 2/9 02/16/2017 01/13/2017 12/10/2016 11/10/2016 10/10/2015  Decreased Interest 1 1 2 1  0  Down, Depressed, Hopeless 1 1 2 1  0  PHQ - 2 Score 2 2 4 2  0  Altered sleeping 2 2 3 1  -  Tired, decreased energy 2 2 3 1  -  Change in appetite 1 1 0 1 -  Feeling bad or failure about yourself  1 1 2 1  -  Trouble concentrating 1 1 2 1  -  Moving slowly or fidgety/restless 0 0 2 0 -  Suicidal thoughts 0 1 0 1 -  PHQ-9 Score 9 10 16 8  -  Difficult doing work/chores - - Very difficult Somewhat difficult -   Relevant past medical, surgical, family and social history reviewed and updated as indicated. Interim medical history since our last visit reviewed. Allergies and medications reviewed and updated.  Review of Systems  Per HPI unless specifically indicated above     Objective:    BP (!) 134/96 (BP Location: Left Arm, Cuff Size: Large)   Pulse 61   Temp 97.6 F (36.4 C)   Wt 250 lb (113.4 kg)   SpO2 98%   BMI 36.60 kg/m   Wt Readings from Last 3 Encounters:  02/16/17 250 lb (113.4 kg)  02/12/17 248 lb 11.2 oz (112.8 kg)  01/13/17 251 lb 6.4 oz (114 kg)    Physical Exam  Constitutional: He is oriented to person, place, and time. He appears well-developed and well-nourished. No distress.  HENT:  Head: Normocephalic and atraumatic.  Eyes: Conjunctivae and lids are normal. Right eye exhibits no discharge. Left eye exhibits no discharge. No scleral  icterus.  Neck: Normal range of motion. Neck supple. No JVD present. Carotid bruit is not present.  Cardiovascular: Normal rate, regular rhythm and normal heart sounds.  Pulmonary/Chest: Effort normal and breath sounds normal. No respiratory distress.  Abdominal: Normal appearance. There is no splenomegaly or hepatomegaly.  Musculoskeletal: Normal range of motion.  Neurological: He is alert and oriented to person, place, and time.  Skin: Skin is warm, dry and intact. No rash noted. No pallor.  Psychiatric: He has a normal mood and affect. His behavior is normal. Judgment and thought content normal.    Results for orders placed or performed in visit on 02/12/17  Rapid Strep Screen (Not at [facility])  Result Value Ref Range   Strep Gp A Ag, IA W/Reflex Positive (A) Negative      Assessment & Plan:   Problem List Items Addressed This Visit      Unprioritized   Depression with anxiety    Continue with Paxil.  Increase dose of Buspar to 10 mg twice a day.        Relevant Medications   busPIRone (BUSPAR) 10 MG  tablet       Follow up plan: Return in about 4 weeks (around 03/16/2017).

## 2017-03-16 ENCOUNTER — Ambulatory Visit: Payer: 59 | Admitting: Unknown Physician Specialty

## 2017-03-20 ENCOUNTER — Encounter: Payer: Self-pay | Admitting: Unknown Physician Specialty

## 2017-03-20 ENCOUNTER — Ambulatory Visit (INDEPENDENT_AMBULATORY_CARE_PROVIDER_SITE_OTHER): Payer: Self-pay | Admitting: Unknown Physician Specialty

## 2017-03-20 DIAGNOSIS — F418 Other specified anxiety disorders: Secondary | ICD-10-CM

## 2017-03-20 DIAGNOSIS — I10 Essential (primary) hypertension: Secondary | ICD-10-CM

## 2017-03-20 NOTE — Assessment & Plan Note (Signed)
Stable, continue present medications.   

## 2017-03-20 NOTE — Progress Notes (Signed)
BP 117/75   Pulse 70   Temp 98.5 F (36.9 C) (Oral)   Wt 242 lb 12.8 oz (110.1 kg)   SpO2 96%   BMI 35.55 kg/m    Subjective:    Patient ID: Joe Gutierrez, male    DOB: [birth year], 42 y.o.   MRN: [id number]  HPI: Joe Gutierrez is a 42 y.o. male  Chief Complaint  Patient presents with  . Depression    1 month f/up    Depression/anxiety  Pt feels current medication is working well.  Pt taking Buspar BID and Paxil daily.   Depression screen [facility] 2/9 03/20/2017 02/16/2017 01/13/2017 12/10/2016 11/10/2016  Decreased Interest 1 1 1 2 1   Down, Depressed, Hopeless 1 1 1 2 1   PHQ - 2 Score 2 2 2 4 2   Altered sleeping 1 2 2 3 1   Tired, decreased energy 1 2 2 3 1   Change in appetite 0 1 1 0 1  Feeling bad or failure about yourself  1 1 1 2 1   Trouble concentrating 0 1 1 2 1   Moving slowly or fidgety/restless 0 0 0 2 0  Suicidal thoughts 0 0 1 0 1  PHQ-9 Score 5 9 10 16 8   Difficult doing work/chores - - - Very difficult Somewhat difficult   Hypertension Using medications without difficulty Average home BPs good control   No problems or lightheadedness No chest pain with exertion or shortness of breath No Edema   Relevant past medical, surgical, family and social history reviewed and updated as indicated. Interim medical history since our last visit reviewed. Allergies and medications reviewed and updated.  Review of Systems  Per HPI unless specifically indicated above     Objective:    BP 117/75   Pulse 70   Temp 98.5 F (36.9 C) (Oral)   Wt 242 lb 12.8 oz (110.1 kg)   SpO2 96%   BMI 35.55 kg/m   Wt Readings from Last 3 Encounters:  03/20/17 242 lb 12.8 oz (110.1 kg)  02/16/17 250 lb (113.4 kg)  02/12/17 248 lb 11.2 oz (112.8 kg)    Physical Exam  Constitutional: He is oriented to person, place, and time. He appears well-developed and well-nourished. No distress.  HENT:  Head: Normocephalic and atraumatic.  Eyes: Conjunctivae and lids are normal. Right  eye exhibits no discharge. Left eye exhibits no discharge. No scleral icterus.  Neck: Normal range of motion. Neck supple. No JVD present. Carotid bruit is not present.  Cardiovascular: Normal rate, regular rhythm and normal heart sounds.  Pulmonary/Chest: Effort normal and breath sounds normal. No respiratory distress.  Abdominal: Normal appearance. There is no splenomegaly or hepatomegaly.  Musculoskeletal: Normal range of motion.  Neurological: He is alert and oriented to person, place, and time.  Skin: Skin is warm, dry and intact. No rash noted. No pallor.  Psychiatric: He has a normal mood and affect. His behavior is normal. Judgment and thought content normal.    Results for orders placed or performed in visit on 02/12/17  Rapid Strep Screen (Not at [facility])  Result Value Ref Range   Strep Gp A Ag, IA W/Reflex Positive (A) Negative      Assessment & Plan:   Problem List Items Addressed This Visit      Unprioritized   Depression with anxiety    Stable, continue present medications.        Essential hypertension, benign    Stable, continue present medications.  Follow up plan: Return in about 6 months (around 09/17/2017), or if symptoms worsen or fail to improve, for physical.

## 2017-06-29 ENCOUNTER — Other Ambulatory Visit: Payer: Self-pay

## 2017-06-29 ENCOUNTER — Encounter: Payer: Self-pay | Admitting: Emergency Medicine

## 2017-06-29 ENCOUNTER — Emergency Department
Admission: EM | Admit: 2017-06-29 | Discharge: 2017-06-29 | Disposition: A | Discharge status: Home / Self Care | Payer: BLUE CROSS/BLUE SHIELD | Attending: Emergency Medicine | Admitting: Emergency Medicine

## 2017-06-29 DIAGNOSIS — I10 Essential (primary) hypertension: Secondary | ICD-10-CM | POA: Insufficient documentation

## 2017-06-29 DIAGNOSIS — M545 Low back pain, unspecified: Secondary | ICD-10-CM

## 2017-06-29 DIAGNOSIS — Z79899 Other long term (current) drug therapy: Secondary | ICD-10-CM | POA: Insufficient documentation

## 2017-06-29 MED ORDER — ORPHENADRINE CITRATE 30 MG/ML IJ SOLN
60.0000 mg | Freq: Two times a day (BID) | INTRAMUSCULAR | Status: DC
  Administered 2017-06-29: 60 mg via INTRAMUSCULAR
  Filled 2017-06-29 (×2): qty 2

## 2017-06-29 MED ORDER — CYCLOBENZAPRINE HCL 10 MG PO TABS: 10.0000 mg | ORAL_TABLET | Freq: Three times a day (TID) | ORAL | 0 refills | Status: AC | PRN

## 2017-06-29 MED ORDER — KETOROLAC TROMETHAMINE 10 MG PO TABS: 10.0000 mg | ORAL_TABLET | Freq: Four times a day (QID) | ORAL | 0 refills | Status: AC | PRN

## 2017-06-29 MED ORDER — KETOROLAC TROMETHAMINE 30 MG/ML IJ SOLN
30.0000 mg | Freq: Once | INTRAMUSCULAR | Status: AC
  Administered 2017-06-29: 30 mg via INTRAMUSCULAR
  Filled 2017-06-29: qty 1

## 2017-06-29 NOTE — ED Provider Notes (Signed)
[facility] Emergency Department Provider Note  ____________________________________________  Time seen: Approximately 7:40 PM  I have reviewed the triage vital signs and the nursing notes.   HISTORY  Chief Complaint Back Pain    HPI Joe Gutierrez is a 42 y.o. male presents to the emergency department with acute onset of low back pain after patient reports that he bent over to tie up a bag of trash.  Patient denies radiculopathy or weakness of the lower extremities.  Patient has been able to ambulate but reports that pain is worsened with ambulation.  No bowel or bladder incontinence.  He currently rates his pain at 10 out of 10 in intensity.  Past Medical History:  Diagnosis Date  . Hypertension   . Kidney stones   . Obesity     Patient Active Problem List   Diagnosis Date Noted  . Sleep apnea 01/13/2017  . Depression with anxiety 11/10/2016  . Obesity, Class II, BMI 35-39.9, with comorbidity 11/10/2016  . Tinea cruris 10/29/2015  . Folliculitis 15/17/6160  . Essential hypertension, benign 08/27/2015  . Occipital neuralgia 08/27/2015    Past Surgical History:  Procedure Laterality Date  . APPENDECTOMY    . LITHOTRIPSY      Prior to Admission medications   Medication Sig Start Date End Date Taking? Authorizing Provider  busPIRone (BUSPAR) 10 MG tablet Take 1 tablet (10 mg total) by mouth 3 (three) times daily. 02/16/17   Kathrine Haddock, NP  cyclobenzaprine (FLEXERIL) 10 MG tablet Take 1 tablet (10 mg total) by mouth 3 (three) times daily as needed for up to 5 days. 06/29/17 07/04/17  Lannie Fields, PA-C  ketorolac (TORADOL) 10 MG tablet Take 1 tablet (10 mg total) by mouth every 6 (six) hours as needed for up to 5 days. 06/29/17 07/04/17  Lannie Fields, PA-C  lisinopril (PRINIVIL,ZESTRIL) 5 MG tablet Take 1 tablet (5 mg total) by mouth daily. 11/10/16   Kathrine Haddock, NP  PARoxetine (PAXIL) 20 MG tablet Take 1 tablet (20 mg total) by mouth  daily. 12/10/16   Kathrine Haddock, NP    Allergies Codeine and Sulfa antibiotics  Family History  Problem Relation Age of Onset  . Diabetes Mother   . Hypertension Mother   . Alcohol abuse Father   . Heart disease Father   . Hypertension Father   . Diabetes Maternal Grandmother   . Stroke Maternal Grandfather   . Hypertension Brother   . Asthma Daughter   . Heart disease Paternal Grandfather     Social History Social History   Tobacco Use  . Smoking status: Never Smoker  . Smokeless tobacco: Never Used  Substance Use Topics  . Alcohol use: No  . Drug use: No     Review of Systems  Constitutional: No fever/chills Eyes: No visual changes. No discharge ENT: No upper respiratory complaints. Cardiovascular: no chest pain. Respiratory: no cough. No SOB. Gastrointestinal: No abdominal pain.  No nausea, no vomiting.  No diarrhea.  No constipation. Musculoskeletal: Patient has back pain.  Skin: Negative for rash, abrasions, lacerations, ecchymosis. Neurological: Negative for headaches, focal weakness or numbness.   ____________________________________________   PHYSICAL EXAM:  VITAL SIGNS: ED Triage Vitals  Enc Vitals Group     BP 06/29/17 1805 (!) 142/101     Pulse Rate 06/29/17 1805 67     Resp --      Temp 06/29/17 1805 97.8 F (36.6 C)     Temp Source 06/29/17 1805 Oral  SpO2 06/29/17 1805 97 %     Weight 06/29/17 1806 250 lb (113.4 kg)     Height 06/29/17 1806 5\' 11"  (1.803 m)     Head Circumference --      Peak Flow --      Pain Score 06/29/17 1805 10     Pain Loc --      Pain Edu? --      Excl. in [place]? --      Constitutional: Alert and oriented. Well appearing and in no acute distress. Eyes: Conjunctivae are normal. PERRL. EOMI. Head: Atraumatic. Cardiovascular: Normal rate, regular rhythm. Normal S1 and S2.  Good peripheral circulation. Respiratory: Normal respiratory effort without tachypnea or retractions. Lungs CTAB. Good air entry to the  bases with no decreased or absent breath sounds. Musculoskeletal: Full range of motion to all extremities. No gross deformities appreciated.  Patient has paraspinal muscle tenderness along the lumbar spine.  Negative straight leg raise bilaterally.  No deficits with resisted extension of the great toes bilaterally. Neurologic:  Normal speech and language. No gross focal neurologic deficits are appreciated.  Skin:  Skin is warm, dry and intact. No rash noted.   ____________________________________________   LABS (all labs ordered are listed, but only abnormal results are displayed)  Labs Reviewed - No data to display ____________________________________________  EKG   ____________________________________________  RADIOLOGY   No results found.  ____________________________________________    PROCEDURES  Procedure(s) performed:    Procedures    Medications  orphenadrine (NORFLEX) injection 60 mg (60 mg Intramuscular Given 06/29/17 2007)  ketorolac (TORADOL) 30 MG/ML injection 30 mg (30 mg Intramuscular Given 06/29/17 1959)     ____________________________________________   INITIAL IMPRESSION / ASSESSMENT AND PLAN / ED COURSE  Pertinent labs & imaging results that were available during my care of the patient were reviewed by me and considered in my medical decision making (see chart for details).  Review of the [place] CSRS was performed in accordance of the [place] prior to dispensing any controlled drugs.      Assessment and Plan: Low back pain Patient presents to the emergency department with left-sided low back pain after reaching down to pick up a bag of trash.  Differential diagnosis included lumbar strain versus muscle spasm.  Patient was given Norflex and Toradol in the emergency department. He was discharged with Toradol and Flexeril. Vital signs were reassuring prior to discharge. All patient questions were answered.     ____________________________________________  FINAL CLINICAL IMPRESSION(S) / ED DIAGNOSES  Final diagnoses:  Acute bilateral low back pain without sciatica      NEW MEDICATIONS STARTED DURING THIS VISIT:  ED Discharge Orders        Ordered    ketorolac (TORADOL) 10 MG tablet  Every 6 hours PRN     06/29/17 2036    cyclobenzaprine (FLEXERIL) 10 MG tablet  3 times daily PRN     06/29/17 2036          This chart was dictated using voice recognition software/Dragon. Despite best efforts to proofread, errors can occur which can change the meaning. Any change was purely unintentional.    Karren Cobble 06/29/17 2053    Carrie Mew, MD 07/01/17 [phone]

## 2017-06-29 NOTE — ED Triage Notes (Signed)
Says low back pain since about 3 pm .  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

## 2017-08-31 ENCOUNTER — Encounter: Payer: Self-pay | Admitting: Unknown Physician Specialty

## 2017-09-21 ENCOUNTER — Encounter: Payer: Self-pay | Admitting: Unknown Physician Specialty

## 2017-09-21 ENCOUNTER — Encounter: Payer: Self-pay | Admitting: Physician Assistant

## 2017-09-21 ENCOUNTER — Ambulatory Visit (INDEPENDENT_AMBULATORY_CARE_PROVIDER_SITE_OTHER): Payer: Managed Care, Other (non HMO) | Admitting: Physician Assistant

## 2017-09-21 VITALS — BP 135/92 | HR 78 | Temp 98.0°F | Ht 69.25 in | Wt 256.8 lb

## 2017-09-21 DIAGNOSIS — I10 Essential (primary) hypertension: Secondary | ICD-10-CM | POA: Diagnosis not present

## 2017-09-21 DIAGNOSIS — Z130 Encounter for screening for diseases of the blood and blood-forming organs and certain disorders involving the immune mechanism: Secondary | ICD-10-CM

## 2017-09-21 DIAGNOSIS — Z0000 Encounter for general adult medical examination without abnormal findings: Secondary | ICD-10-CM | POA: Diagnosis not present

## 2017-09-21 DIAGNOSIS — F418 Other specified anxiety disorders: Secondary | ICD-10-CM | POA: Diagnosis not present

## 2017-09-21 DIAGNOSIS — Z13220 Encounter for screening for lipoid disorders: Secondary | ICD-10-CM

## 2017-09-21 DIAGNOSIS — G4733 Obstructive sleep apnea (adult) (pediatric): Secondary | ICD-10-CM

## 2017-09-21 DIAGNOSIS — Z131 Encounter for screening for diabetes mellitus: Secondary | ICD-10-CM

## 2017-09-21 MED ORDER — LISINOPRIL 5 MG PO TABS: 5.0000 mg | ORAL_TABLET | Freq: Every day | ORAL | 3 refills | Status: DC

## 2017-09-21 MED ORDER — PAROXETINE HCL 20 MG PO TABS: 20.0000 mg | ORAL_TABLET | Freq: Every day | ORAL | 1 refills | Status: DC

## 2017-09-21 MED ORDER — BUSPIRONE HCL 5 MG PO TABS: ORAL_TABLET | ORAL | 1 refills | Status: DC

## 2017-09-21 NOTE — Patient Instructions (Signed)

## 2017-09-21 NOTE — Progress Notes (Signed)
Subjective:    Patient ID: Joe Gutierrez, male    DOB: 12/01/1975, 42 y.o.   MRN: [id number]  Joe Gutierrez is a 42 y.o. male presenting on 09/21/2017 for Annual Exam and Medication Management (pt states he has not taken his medications in the past month due to not having insurance)   HPI  Works as Theatre manager, lives in [place] with wife and kids, married 50 years, kids age 32 and age 83; ages 39 and 42. Presenting today for physical exam.   Has lost his insurance and has run out of medications for the past one month. He was taking Lisinopril 5 mg QD for HTN, paxil 20 mg QD and Buspar 10 mg TID for depression and anxiety.   Additionally, he was diagnosed with sleep apnea via sleep study 02/05/2017. Sleep study recommended follow up sleep titration study but patient was unable to get due to loss of insurance. He is not currently using a CPAP.    Past Medical History:  Diagnosis Date  . Hypertension   . Kidney stones   . Obesity    Past Surgical History:  Procedure Laterality Date  . APPENDECTOMY    . LITHOTRIPSY     Social History   Socioeconomic History  . Marital status: Married    Spouse name: Not on file  . Number of children: Not on file  . Years of education: Not on file  . Highest education level: Not on file  Occupational History  . Not on file  Social Needs  . Financial resource strain: Not on file  . Food insecurity:    Worry: Not on file    Inability: Not on file  . Transportation needs:    Medical: Not on file    Non-medical: Not on file  Tobacco Use  . Smoking status: Never Smoker  . Smokeless tobacco: Never Used  Substance and Sexual Activity  . Alcohol use: No  . Drug use: No  . Sexual activity: Yes  Lifestyle  . Physical activity:    Days per week: Not on file    Minutes per session: Not on file  . Stress: Not on file  Relationships  . Social connections:    Talks on phone: Not on file    Gets together: Not on file    Attends  religious service: Not on file    Active member of club or organization: Not on file    Attends meetings of clubs or organizations: Not on file    Relationship status: Not on file  . Intimate partner violence:    Fear of current or ex partner: Not on file    Emotionally abused: Not on file    Physically abused: Not on file    Forced sexual activity: Not on file  Other Topics Concern  . Not on file  Social History Narrative  . Not on file   Family History  Problem Relation Age of Onset  . Diabetes Mother   . Hypertension Mother   . Alcohol abuse Father   . Heart disease Father   . Hypertension Father   . Diabetes Maternal Grandmother   . Stroke Maternal Grandfather   . Hypertension Brother   . Asthma Daughter   . Heart disease Paternal Grandfather    No current outpatient medications on file prior to visit.   No current facility-administered medications on file prior to visit.     Review of Systems Per HPI unless specifically indicated above  Objective:    BP (!) 135/92 (BP Location: Left Arm, Cuff Size: Large)   Pulse 78   Temp 98 F (36.7 C) (Oral)   Ht 5' 9.25" (1.759 m)   Wt 256 lb 12.8 oz (116.5 kg)   SpO2 98%   BMI 37.65 kg/m   Wt Readings from Last 3 Encounters:  09/21/17 256 lb 12.8 oz (116.5 kg)  06/29/17 250 lb (113.4 kg)  03/20/17 242 lb 12.8 oz (110.1 kg)    Physical Exam Results for orders placed or performed in visit on 02/12/17  Rapid Strep Screen (Not at [facility])  Result Value Ref Range   Strep Gp A Ag, IA W/Reflex Positive (A) Negative      Assessment & Plan:   1. Annual physical exam   2. Diabetes mellitus screening  - HgB A1c  3. Screening for deficiency anemia  - CBC with Differential  4. Essential hypertension, benign  Restart Lisinopril. Check labs as below. Prior BP reading in February on 5 mg lisinopril was controlled, BP slightly elevated today.  - Comp Met (CMET) - lisinopril (PRINIVIL,ZESTRIL) 5 MG tablet; Take 1  tablet (5 mg total) by mouth daily.  Dispense: 90 tablet; Refill: 3  5. Screening, lipid  - Lipid Profile  6. Depression with anxiety  Restart and titrate as below.   - PARoxetine (PAXIL) 20 MG tablet; Take 1 tablet (20 mg total) by mouth daily.  Dispense: 30 tablet; Refill: 1 - busPIRone (BUSPAR) 5 MG tablet; 5 mg 3 times a day x 3 days.10 mg morning and 5 mg lunch & nightly x 3 days. Every 3 days, increase ONE of the daily doses by 5 mg.  Dispense: 180 tablet; Refill: 1  7. Obstructive sleep apnea syndrome  Have talked about different options. Mutually decided to forgo CPAP titration study and order an auto-titrating CPAP. Discussed with patient that he will likely need a 1-2 mo CPAP compliance follow up.     Follow up plan: Return in about 6 months (around 03/24/2018) for HTN, depression and anxiety .  Carles Collet, PA-C [facility] 09/22/2017, 9:04 AM

## 2017-09-22 ENCOUNTER — Other Ambulatory Visit: Payer: Managed Care, Other (non HMO)

## 2018-03-23 ENCOUNTER — Encounter: Payer: Self-pay | Admitting: Nurse Practitioner

## 2018-03-23 DIAGNOSIS — R7301 Impaired fasting glucose: Secondary | ICD-10-CM | POA: Insufficient documentation

## 2018-03-23 DIAGNOSIS — E559 Vitamin D deficiency, unspecified: Secondary | ICD-10-CM | POA: Insufficient documentation

## 2018-03-24 ENCOUNTER — Ambulatory Visit: Payer: Managed Care, Other (non HMO) | Admitting: Nurse Practitioner

## 2018-10-29 ENCOUNTER — Other Ambulatory Visit: Payer: Self-pay

## 2018-10-29 ENCOUNTER — Encounter: Payer: Self-pay | Admitting: Nurse Practitioner

## 2018-10-29 ENCOUNTER — Ambulatory Visit (INDEPENDENT_AMBULATORY_CARE_PROVIDER_SITE_OTHER): Payer: Managed Care, Other (non HMO) | Admitting: Nurse Practitioner

## 2018-10-29 VITALS — BP 148/98 | HR 70 | Temp 98.5°F | Ht 69.25 in | Wt 260.0 lb

## 2018-10-29 DIAGNOSIS — E559 Vitamin D deficiency, unspecified: Secondary | ICD-10-CM

## 2018-10-29 DIAGNOSIS — G4733 Obstructive sleep apnea (adult) (pediatric): Secondary | ICD-10-CM | POA: Diagnosis not present

## 2018-10-29 DIAGNOSIS — I10 Essential (primary) hypertension: Secondary | ICD-10-CM | POA: Diagnosis not present

## 2018-10-29 DIAGNOSIS — Z0000 Encounter for general adult medical examination without abnormal findings: Secondary | ICD-10-CM | POA: Diagnosis not present

## 2018-10-29 DIAGNOSIS — Z23 Encounter for immunization: Secondary | ICD-10-CM

## 2018-10-29 DIAGNOSIS — F418 Other specified anxiety disorders: Secondary | ICD-10-CM | POA: Diagnosis not present

## 2018-10-29 DIAGNOSIS — Z6838 Body mass index (BMI) 38.0-38.9, adult: Secondary | ICD-10-CM

## 2018-10-29 DIAGNOSIS — R7301 Impaired fasting glucose: Secondary | ICD-10-CM

## 2018-10-29 DIAGNOSIS — E6609 Other obesity due to excess calories: Secondary | ICD-10-CM

## 2018-10-29 MED ORDER — BUSPIRONE HCL 5 MG PO TABS: ORAL_TABLET | ORAL | 1 refills | Status: DC

## 2018-10-29 MED ORDER — PAROXETINE HCL 20 MG PO TABS: 20.0000 mg | ORAL_TABLET | Freq: Every day | ORAL | 3 refills | Status: DC

## 2018-10-29 MED ORDER — LISINOPRIL 5 MG PO TABS: 5.0000 mg | ORAL_TABLET | Freq: Every day | ORAL | 3 refills | Status: DC

## 2018-10-29 NOTE — Assessment & Plan Note (Signed)
Chronic, ongoing.  Has been without medication for 6 months.  Denies SI/HI.  Refills sent on medication today and will monitor.  Return in 4 weeks for follow-up.

## 2018-10-29 NOTE — Assessment & Plan Note (Signed)
Chronic with elevation in BP today due to no medication in 6 months (no insurance).  Refill sent today.  Recommend checking BP at home daily and documenting.  Labs today.  Return in 4 weeks for BP follow-up.

## 2018-10-29 NOTE — Progress Notes (Signed)
BP (!) 148/98 (BP Location: Left Arm)   Pulse 70   Temp 98.5 F (36.9 C) (Oral)   Ht 5' 9.25" (1.759 m)   Wt 260 lb (117.9 kg)   SpO2 97%   BMI 38.12 kg/m    Subjective:    Patient ID: Joe Gutierrez, male    DOB: 20-May-1975, 43 y.o.   MRN: [id number]  HPI: Joe Gutierrez is a 43 y.o. male presenting on 10/29/2018 for comprehensive medical examination. Current medical complaints include:none  He currently lives with: wife and children Interim Problems from his last visit: no   HYPERTENSION Has been out of medication for 6 months due to no insurance. Hypertension status: stable  Satisfied with current treatment? yes Duration of hypertension: chronic BP monitoring frequency:  not checking BP range:  BP medication side effects:  no Medication compliance: good compliance Aspirin: no Recurrent headaches: no Visual changes: no Palpitations: no Dyspnea: no Chest pain: no Lower extremity edema: no Dizzy/lightheaded: no  The 10-year ASCVD risk score [name], et al., 2013) is: 3.8%   Values used to calculate the score:     Age: 29 years     Sex: Male     Is Non-Hispanic African American: No     Diabetic: No     Tobacco smoker: No     Systolic Blood Pressure: 123456 mmHg     Is BP treated: Yes     HDL Cholesterol: 33 mg/dL     Total Cholesterol: 188 mg/dL  DEPRESSION Has been without medication for 6 months.  Reports previous regimen worked well. Mood status: stable Satisfied with current treatment?: yes Symptom severity: mild  Duration of current treatment : chronic Side effects: no Medication compliance: good compliance Psychotherapy/counseling: none Previous psychiatric medications: Paxil and busoar Depressed mood: no Anxious mood: yes Anhedonia: no Significant weight loss or gain: no Insomnia: yes hard to fall asleep Fatigue: no Feelings of worthlessness or guilt: no Impaired concentration/indecisiveness: no Suicidal ideations: no Hopelessness: no  Crying spells: no   IFG:  Recent labs with glucose 100 to 105.  Hypoglycemic episodes:no Polydipsia/polyuria: no Visual disturbance: no Chest pain: no Paresthesias: no   SLEEP APNEA Had sleep study over a year ago, but never went for machine.  Testing in 2018. Sleep apnea status: stable Duration: chronic Satisfied with current treatment?:  no CPAP use:  no Sleep quality with CPAP use: poor Treament compliance:poor compliance Last sleep study:  Treatments attempted:  Wakes feeling refreshed:  no Daytime hypersomnolence:  yes Fatigue:  yes Insomnia:  yes Good sleep hygiene:  yes Difficulty falling asleep:  yes Difficulty staying asleep:  yes Snoring bothers bed partner:  yes Observed apnea by bed partner: yes Obesity:  yes Hypertension: yes  Pulmonary hypertension:  no Coronary artery disease:  no  Functional Status Survey: Is the patient deaf or have difficulty hearing?: Yes Does the patient have difficulty seeing, even when wearing glasses/contacts?: No Does the patient have difficulty concentrating, remembering, or making decisions?: No Does the patient have difficulty walking or climbing stairs?: No Does the patient have difficulty dressing or bathing?: No Does the patient have difficulty doing errands alone such as visiting a doctor's office or shopping?: No  FALL RISK: Fall Risk  09/21/2017 10/10/2015  Falls in the past year? No No    Depression Screen Depression screen [facility] 2/9 10/29/2018 09/21/2017 03/20/2017 02/16/2017 01/13/2017  Decreased Interest 1 1 1 1 1   Down, Depressed, Hopeless 2 1 1 1  1  PHQ - 2 Score 3 2 2 2 2   Altered sleeping 2 2 1 2 2   Tired, decreased energy 2 2 1 2 2   Change in appetite 2 1 0 1 1  Feeling bad or failure about yourself  1 1 1 1 1   Trouble concentrating 1 1 0 1 1  Moving slowly or fidgety/restless 1 1 0 0 0  Suicidal thoughts 1 1 0 0 1  PHQ-9 Score 13 11 5 9 10   Difficult doing work/chores Somewhat difficult - - - -     Advanced Directives <no information>  Past Medical History:  Past Medical History:  Diagnosis Date  . Hypertension   . Kidney stones   . Obesity     Surgical History:  Past Surgical History:  Procedure Laterality Date  . APPENDECTOMY    . LITHOTRIPSY      Medications:  No current outpatient medications on file prior to visit.   No current facility-administered medications on file prior to visit.     Allergies:  Allergies  Allergen Reactions  . Codeine Hives  . Sulfa Antibiotics Hives    Social History:  Social History   Socioeconomic History  . Marital status: Married    Spouse name: Not on file  . Number of children: Not on file  . Years of education: Not on file  . Highest education level: Not on file  Occupational History  . Not on file  Social Needs  . Financial resource strain: Not on file  . Food insecurity    Worry: Not on file    Inability: Not on file  . Transportation needs    Medical: Not on file    Non-medical: Not on file  Tobacco Use  . Smoking status: Never Smoker  . Smokeless tobacco: Never Used  Substance and Sexual Activity  . Alcohol use: No  . Drug use: No  . Sexual activity: Yes  Lifestyle  . Physical activity    Days per week: Not on file    Minutes per session: Not on file  . Stress: Not on file  Relationships  . Social Herbalist on phone: Not on file    Gets together: Not on file    Attends religious service: Not on file    Active member of club or organization: Not on file    Attends meetings of clubs or organizations: Not on file    Relationship status: Not on file  . Intimate partner violence    Fear of current or ex partner: Not on file    Emotionally abused: Not on file    Physically abused: Not on file    Forced sexual activity: Not on file  Other Topics Concern  . Not on file  Social History Narrative  . Not on file   Social History   Tobacco Use  Smoking Status Never Smoker  Smokeless  Tobacco Never Used   Social History   Substance and Sexual Activity  Alcohol Use No    Family History:  Family History  Problem Relation Age of Onset  . Diabetes Mother   . Hypertension Mother   . Alcohol abuse Father   . Heart disease Father   . Hypertension Father   . Diabetes Maternal Grandmother   . Stroke Maternal Grandfather   . Hypertension Brother   . Asthma Daughter   . Heart disease Paternal Grandfather     Past medical history, surgical history, medications, allergies, family history and  social history reviewed with patient today and changes made to appropriate areas of the chart.   Review of Systems - negative All other ROS negative except what is listed above and in the HPI.      Objective:    BP (!) 148/98 (BP Location: Left Arm)   Pulse 70   Temp 98.5 F (36.9 C) (Oral)   Ht 5' 9.25" (1.759 m)   Wt 260 lb (117.9 kg)   SpO2 97%   BMI 38.12 kg/m   Wt Readings from Last 3 Encounters:  10/29/18 260 lb (117.9 kg)  09/21/17 256 lb 12.8 oz (116.5 kg)  06/29/17 250 lb (113.4 kg)    Physical Exam Vitals signs and nursing note reviewed.  Constitutional:      General: He is awake. He is not in acute distress.    Appearance: He is well-developed. He is obese. He is not ill-appearing.  HENT:     Head: Normocephalic and atraumatic.     Right Ear: Hearing, tympanic membrane, ear canal and external ear normal. No drainage.     Left Ear: Hearing, tympanic membrane, ear canal and external ear normal. No drainage.     Nose: Nose normal.     Mouth/Throat:     Mouth: Mucous membranes are moist.     Pharynx: Oropharynx is clear. Uvula midline.  Eyes:     General: Lids are normal.        Right eye: No discharge.        Left eye: No discharge.     Extraocular Movements: Extraocular movements intact.     Conjunctiva/sclera: Conjunctivae normal.     Pupils: Pupils are equal, round, and reactive to light.     Visual Fields: Right eye visual fields normal and left  eye visual fields normal.  Neck:     Musculoskeletal: Normal range of motion and neck supple.     Thyroid: No thyromegaly.     Vascular: No carotid bruit.     Trachea: Trachea normal.  Cardiovascular:     Rate and Rhythm: Normal rate and regular rhythm.     Heart sounds: Normal heart sounds, S1 normal and S2 normal. No murmur. No gallop.   Pulmonary:     Effort: Pulmonary effort is normal. No accessory muscle usage or respiratory distress.     Breath sounds: Normal breath sounds.  Abdominal:     General: Bowel sounds are normal.     Palpations: Abdomen is soft. There is no hepatomegaly or splenomegaly.     Tenderness: There is no abdominal tenderness.     Hernia: There is no hernia in the left inguinal area or right inguinal area.  Genitourinary:    Penis: Normal.      Scrotum/Testes: Normal.     Epididymis:     Right: Normal.     Left: Normal.  Musculoskeletal: Normal range of motion.     Right lower leg: No edema.     Left lower leg: No edema.  Skin:    General: Skin is warm and dry.     Capillary Refill: Capillary refill takes less than 2 seconds.     Findings: No rash.  Neurological:     Mental Status: He is alert and oriented to person, place, and time.     Cranial Nerves: Cranial nerves are intact.     Gait: Gait is intact.     Deep Tendon Reflexes: Reflexes are normal and symmetric.     Reflex Scores:  Brachioradialis reflexes are 2+ on the right side and 2+ on the left side.      Patellar reflexes are 2+ on the right side and 2+ on the left side. Psychiatric:        Attention and Perception: Attention normal.        Mood and Affect: Mood normal.        Speech: Speech normal.        Behavior: Behavior normal. Behavior is cooperative.        Thought Content: Thought content normal.        Judgment: Judgment normal.    Results for orders placed or performed in visit on 02/12/17  Rapid Strep Screen (Not at [facility])   Specimen: Other   OTHER  Result Value Ref  Range   Strep Gp A Ag, IA W/Reflex Positive (A) Negative      Assessment & Plan:   Problem List Items Addressed This Visit      Cardiovascular and Mediastinum   Essential hypertension, benign    Chronic with elevation in BP today due to no medication in 6 months (no insurance).  Refill sent today.  Recommend checking BP at home daily and documenting.  Labs today.  Return in 4 weeks for BP follow-up.      Relevant Medications   lisinopril (ZESTRIL) 5 MG tablet   Other Relevant Orders   Comprehensive metabolic panel   CBC with Differential/Platelet   Lipid Panel w/o Chol/HDL Ratio     Respiratory   Sleep apnea    New sleep study referral sent per patient request.      Relevant Orders   Ambulatory referral to Sleep Studies     Endocrine   IFG (impaired fasting glucose)    Check A1C today.  Discussed monitoring food intake high in sugar and carbohydrates.      Relevant Orders   HgB A1c     Other   Depression with anxiety    Chronic, ongoing.  Has been without medication for 6 months.  Denies SI/HI.  Refills sent on medication today and will monitor.  Return in 4 weeks for follow-up.      Relevant Medications   PARoxetine (PAXIL) 20 MG tablet   busPIRone (BUSPAR) 5 MG tablet   Obesity    Recommend continued focus on health diet choices and regular physical activity (30 minutes 5 days a week).      Vitamin D deficiency    Check Vitamin D level today.  Recommend daily supplement 1000 units.      Relevant Orders   TSH   PSA    Other Visit Diagnoses    Encounter for annual physical exam    -  Primary   Relevant Orders   VITAMIN D 25 Hydroxy (Vit-D Deficiency, Fractures)   Need for influenza vaccination       Relevant Orders   Flu Vaccine QUAD 36+ mos IM (Completed)      Discussed aspirin prophylaxis for myocardial infarction prevention and decision was it was not indicated  LABORATORY TESTING:  Health maintenance labs ordered today as discussed above.    The natural history of prostate cancer and ongoing controversy regarding screening and potential treatment outcomes of prostate cancer has been discussed with the patient. The meaning of a false positive PSA and a false negative PSA has been discussed. He indicates understanding of the limitations of this screening test and wishes to proceed with screening PSA testing.   IMMUNIZATIONS:   -  Tdap: Tetanus vaccination status reviewed: last tetanus booster within 10 years. - Influenza: Up to date - Pneumovax: Not applicable - Prevnar: Not applicable - Zostavax vaccine: Not applicable  SCREENING: - Colonoscopy: Not applicable  Discussed with patient purpose of the colonoscopy is to detect colon cancer at curable precancerous or early stages   - AAA Screening: Not applicable  -Hearing Test: Not applicable  -Spirometry: Not applicable   PATIENT COUNSELING:    Sexuality: Discussed sexually transmitted diseases, partner selection, use of condoms, avoidance of unintended pregnancy  and contraceptive alternatives.   Advised to avoid cigarette smoking.  I discussed with the patient that most people either abstain from alcohol or drink within safe limits (<=14/week and <=4 drinks/occasion for males, <=7/weeks and <= 3 drinks/occasion for females) and that the risk for alcohol disorders and other health effects rises proportionally with the number of drinks per week and how often a drinker exceeds daily limits.  Discussed cessation/primary prevention of drug use and availability of treatment for abuse.   Diet: Encouraged to adjust caloric intake to maintain  or achieve ideal body weight, to reduce intake of dietary saturated fat and total fat, to limit sodium intake by avoiding high sodium foods and not adding table salt, and to maintain adequate dietary potassium and calcium preferably from fresh fruits, vegetables, and low-fat dairy products.    stressed the importance of regular exercise   Injury prevention: Discussed safety belts, safety helmets, smoke detector, smoking near bedding or upholstery.   Dental health: Discussed importance of regular tooth brushing, flossing, and dental visits.   Follow up plan: NEXT PREVENTATIVE PHYSICAL DUE IN 1 YEAR. Return in about 4 weeks (around 11/26/2018) for HTN follow-up.

## 2018-10-29 NOTE — Assessment & Plan Note (Signed)
Check Vitamin D level today.  Recommend daily supplement 1000 units.

## 2018-10-29 NOTE — Assessment & Plan Note (Signed)
Recommend continued focus on health diet choices and regular physical activity (30 minutes 5 days a week). 

## 2018-10-29 NOTE — Assessment & Plan Note (Signed)
New sleep study referral sent per patient request.

## 2018-10-29 NOTE — Assessment & Plan Note (Signed)
Check A1C today.  Discussed monitoring food intake high in sugar and carbohydrates.

## 2018-10-29 NOTE — Patient Instructions (Signed)
Influenza (Flu) Vaccine (Inactivated or Recombinant): What You Need to Know 1. Why get vaccinated? Influenza vaccine can prevent influenza (flu). Flu is a contagious disease that spreads around the [place] every year, usually between October and May. Anyone can get the flu, but it is more dangerous for some people. Infants and young children, people 43 years of age and older, pregnant women, and people with certain health conditions or a weakened immune system are at greatest risk of flu complications. Pneumonia, bronchitis, sinus infections and ear infections are examples of flu-related complications. If you have a medical condition, such as heart disease, cancer or diabetes, flu can make it worse. Flu can cause fever and chills, sore throat, muscle aches, fatigue, cough, headache, and runny or stuffy nose. Some people may have vomiting and diarrhea, though this is more common in children than adults. Each year thousands of people in the [place] die from flu, and many more are hospitalized. Flu vaccine prevents millions of illnesses and flu-related visits to the doctor each year. 2. Influenza vaccine CDC recommends everyone 57 months of age and older get vaccinated every flu season. Children 6 months through 2 years of age may need 2 doses during a single flu season. Everyone else needs only 1 dose each flu season. It takes about 2 weeks for protection to develop after vaccination. There are many flu viruses, and they are always changing. Each year a new flu vaccine is made to protect against three or four viruses that are likely to cause disease in the upcoming flu season. Even when the vaccine doesn't exactly match these viruses, it may still provide some protection. Influenza vaccine does not cause flu. Influenza vaccine may be given at the same time as other vaccines. 3. Talk with your health care provider Tell your vaccine provider if the person getting the vaccine:  Has had an  allergic reaction after a previous dose of influenza vaccine, or has any severe, life-threatening allergies.  Has ever had Guillain-Barr Syndrome (also called GBS). In some cases, your health care provider may decide to postpone influenza vaccination to a future visit. People with minor illnesses, such as a cold, may be vaccinated. People who are moderately or severely ill should usually wait until they recover before getting influenza vaccine. Your health care provider can give you more information. 4. Risks of a vaccine reaction  Soreness, redness, and swelling where shot is given, fever, muscle aches, and headache can happen after influenza vaccine.  There may be a very small increased risk of Guillain-Barr Syndrome (GBS) after inactivated influenza vaccine (the flu shot). Young children who get the flu shot along with pneumococcal vaccine (PCV13), and/or DTaP vaccine at the same time might be slightly more likely to have a seizure caused by fever. Tell your health care provider if a child who is getting flu vaccine has ever had a seizure. People sometimes faint after medical procedures, including vaccination. Tell your provider if you feel dizzy or have vision changes or ringing in the ears. As with any medicine, there is a very remote chance of a vaccine causing a severe allergic reaction, other serious injury, or death. 5. What if there is a serious problem? An allergic reaction could occur after the vaccinated person leaves the clinic. If you see signs of a severe allergic reaction (hives, swelling of the face and throat, difficulty breathing, a fast heartbeat, dizziness, or weakness), call 9-1-1 and get the person to the nearest hospital. For other signs that  concern you, call your health care provider. Adverse reactions should be reported to the Vaccine Adverse Event Reporting System (VAERS). Your health care provider will usually file this report, or you can do it yourself. Visit the  VAERS website at www.vaers.SamedayNews.es or call 705 448 1532.VAERS is only for reporting reactions, and VAERS staff do not give medical advice. 6. The National Vaccine Injury Compensation Program The [employer] Vaccine Injury Compensation Program (VICP) is a federal program that was created to compensate people who may have been injured by certain vaccines. Visit the VICP website at GoldCloset.com.ee or call 306-355-2824 to learn about the program and about filing a claim. There is a time limit to file a claim for compensation. 7. How can I learn more?  Ask your healthcare provider.  Call your local or state health department.  Contact the Centers for Disease Control and Prevention (CDC): ? Call 419-220-4576 (1-800-CDC-INFO) or ? Visit CDC's https://gibson.com/ Vaccine Information Statement (Interim) Inactivated Influenza Vaccine (10/01/2017) This information is not intended to replace advice given to you by your health care provider. Make sure you discuss any questions you have with your health care provider. Document Released: 11/28/2005 Document Revised: 05/25/2018 Document Reviewed: 10/05/2017 Elsevier Patient Education  [employer]. Influenza (Flu) Vaccine (Inactivated or Recombinant): What You Need to Know 1. Why get vaccinated? Influenza vaccine can prevent influenza (flu). Flu is a contagious disease that spreads around the [place] every year, usually between October and May. Anyone can get the flu, but it is more dangerous for some people. Infants and young children, people 44 years of age and older, pregnant women, and people with certain health conditions or a weakened immune system are at greatest risk of flu complications. Pneumonia, bronchitis, sinus infections and ear infections are examples of flu-related complications. If you have a medical condition, such as heart disease, cancer or diabetes, flu can make it worse. Flu can cause fever and chills, sore  throat, muscle aches, fatigue, cough, headache, and runny or stuffy nose. Some people may have vomiting and diarrhea, though this is more common in children than adults. Each year thousands of people in the [place] die from flu, and many more are hospitalized. Flu vaccine prevents millions of illnesses and flu-related visits to the doctor each year. 2. Influenza vaccine CDC recommends everyone 46 months of age and older get vaccinated every flu season. Children 6 months through 40 years of age may need 2 doses during a single flu season. Everyone else needs only 1 dose each flu season. It takes about 2 weeks for protection to develop after vaccination. There are many flu viruses, and they are always changing. Each year a new flu vaccine is made to protect against three or four viruses that are likely to cause disease in the upcoming flu season. Even when the vaccine doesn't exactly match these viruses, it may still provide some protection. Influenza vaccine does not cause flu. Influenza vaccine may be given at the same time as other vaccines. 3. Talk with your health care provider Tell your vaccine provider if the person getting the vaccine:  Has had an allergic reaction after a previous dose of influenza vaccine, or has any severe, life-threatening allergies.  Has ever had Guillain-Barr Syndrome (also called GBS). In some cases, your health care provider may decide to postpone influenza vaccination to a future visit. People with minor illnesses, such as a cold, may be vaccinated. People who are moderately or severely ill should usually wait until they recover before  getting influenza vaccine. Your health care provider can give you more information. 4. Risks of a vaccine reaction  Soreness, redness, and swelling where shot is given, fever, muscle aches, and headache can happen after influenza vaccine.  There may be a very small increased risk of Guillain-Barr Syndrome (GBS) after  inactivated influenza vaccine (the flu shot). Young children who get the flu shot along with pneumococcal vaccine (PCV13), and/or DTaP vaccine at the same time might be slightly more likely to have a seizure caused by fever. Tell your health care provider if a child who is getting flu vaccine has ever had a seizure. People sometimes faint after medical procedures, including vaccination. Tell your provider if you feel dizzy or have vision changes or ringing in the ears. As with any medicine, there is a very remote chance of a vaccine causing a severe allergic reaction, other serious injury, or death. 5. What if there is a serious problem? An allergic reaction could occur after the vaccinated person leaves the clinic. If you see signs of a severe allergic reaction (hives, swelling of the face and throat, difficulty breathing, a fast heartbeat, dizziness, or weakness), call 9-1-1 and get the person to the nearest hospital. For other signs that concern you, call your health care provider. Adverse reactions should be reported to the Vaccine Adverse Event Reporting System (VAERS). Your health care provider will usually file this report, or you can do it yourself. Visit the VAERS website at www.vaers.SamedayNews.es or call 615 240 4813.VAERS is only for reporting reactions, and VAERS staff do not give medical advice. 6. The National Vaccine Injury Compensation Program The [employer] Vaccine Injury Compensation Program (VICP) is a federal program that was created to compensate people who may have been injured by certain vaccines. Visit the VICP website at GoldCloset.com.ee or call 613-763-2747 to learn about the program and about filing a claim. There is a time limit to file a claim for compensation. 7. How can I learn more?  Ask your healthcare provider.  Call your local or state health department.  Contact the Centers for Disease Control and Prevention (CDC): ? Call 351-332-7381  (1-800-CDC-INFO) or ? Visit CDC's https://gibson.com/ Vaccine Information Statement (Interim) Inactivated Influenza Vaccine (10/01/2017) This information is not intended to replace advice given to you by your health care provider. Make sure you discuss any questions you have with your health care provider. Document Released: 11/28/2005 Document Revised: 05/25/2018 Document Reviewed: 10/05/2017 Elsevier Patient Education  2020 [employer].

## 2018-10-30 LAB — CBC WITH DIFFERENTIAL/PLATELET
Basophils Absolute: 0.1 10*3/uL (ref 0.0–0.2)
Basos: 1 %
EOS (ABSOLUTE): 0.3 10*3/uL (ref 0.0–0.4)
Eos: 4 %
Hematocrit: 45.5 % (ref 37.5–51.0)
Hemoglobin: 15.3 g/dL (ref 13.0–17.7)
Immature Grans (Abs): 0 10*3/uL (ref 0.0–0.1)
Immature Granulocytes: 0 %
Lymphocytes Absolute: 2 10*3/uL (ref 0.7–3.1)
Lymphs: 24 %
MCH: 28.8 pg (ref 26.6–33.0)
MCHC: 33.6 g/dL (ref 31.5–35.7)
MCV: 86 fL (ref 79–97)
Monocytes Absolute: 0.7 10*3/uL (ref 0.1–0.9)
Monocytes: 9 %
Neutrophils Absolute: 5.2 10*3/uL (ref 1.4–7.0)
Neutrophils: 62 %
Platelets: 228 10*3/uL (ref 150–450)
RBC: 5.31 x10E6/uL (ref 4.14–5.80)
RDW: 12.4 % (ref 11.6–15.4)
WBC: 8.3 10*3/uL (ref 3.4–10.8)

## 2018-10-30 LAB — TSH: TSH: 0.96 u[IU]/mL (ref 0.450–4.500)

## 2018-10-30 LAB — COMPREHENSIVE METABOLIC PANEL
ALT: 28 IU/L (ref 0–44)
AST: 22 IU/L (ref 0–40)
Albumin/Globulin Ratio: 2 (ref 1.2–2.2)
Albumin: 4.7 g/dL (ref 4.0–5.0)
Alkaline Phosphatase: 76 IU/L (ref 39–117)
BUN/Creatinine Ratio: 13 (ref 9–20)
BUN: 12 mg/dL (ref 6–24)
Bilirubin Total: 0.5 mg/dL (ref 0.0–1.2)
CO2: 22 mmol/L (ref 20–29)
Calcium: 9 mg/dL (ref 8.7–10.2)
Chloride: 100 mmol/L (ref 96–106)
Creatinine, Ser: 0.92 mg/dL (ref 0.76–1.27)
GFR calc Af Amer: 117 mL/min/{1.73_m2} (ref >59)
GFR calc non Af Amer: 102 mL/min/{1.73_m2} (ref >59)
Globulin, Total: 2.4 g/dL (ref 1.5–4.5)
Glucose: 84 mg/dL (ref 65–99)
Potassium: 4.3 mmol/L (ref 3.5–5.2)
Sodium: 141 mmol/L (ref 134–144)
Total Protein: 7.1 g/dL (ref 6.0–8.5)

## 2018-10-30 LAB — LIPID PANEL W/O CHOL/HDL RATIO
Cholesterol, Total: 201 mg/dL — ABNORMAL HIGH (ref 100–199)
HDL: 39 mg/dL — ABNORMAL LOW (ref >39)
LDL Chol Calc (NIH): 146 mg/dL — ABNORMAL HIGH (ref 0–99)
Triglycerides: 89 mg/dL (ref 0–149)
VLDL Cholesterol Cal: 16 mg/dL (ref 5–40)

## 2018-10-30 LAB — VITAMIN D 25 HYDROXY (VIT D DEFICIENCY, FRACTURES): Vit D, 25-Hydroxy: 28.2 ng/mL — ABNORMAL LOW (ref 30.0–100.0)

## 2018-10-30 LAB — PSA: Prostate Specific Ag, Serum: 1.1 ng/mL (ref 0.0–4.0)

## 2018-10-30 LAB — HEMOGLOBIN A1C
Est. average glucose Bld gHb Est-mCnc: 103 mg/dL
Hgb A1c MFr Bld: 5.2 % (ref 4.8–5.6)

## 2018-10-31 ENCOUNTER — Encounter: Payer: Self-pay | Admitting: Nurse Practitioner

## 2018-10-31 NOTE — Progress Notes (Signed)
Lab results letter. 

## 2018-11-26 ENCOUNTER — Ambulatory Visit: Payer: Managed Care, Other (non HMO) | Admitting: Nurse Practitioner

## 2018-11-30 ENCOUNTER — Encounter: Payer: Self-pay | Admitting: Nurse Practitioner

## 2018-12-06 ENCOUNTER — Encounter: Payer: Self-pay | Admitting: Nurse Practitioner

## 2018-12-06 ENCOUNTER — Ambulatory Visit (INDEPENDENT_AMBULATORY_CARE_PROVIDER_SITE_OTHER): Payer: Managed Care, Other (non HMO) | Admitting: Nurse Practitioner

## 2018-12-06 ENCOUNTER — Other Ambulatory Visit: Payer: Self-pay | Admitting: Nurse Practitioner

## 2018-12-06 ENCOUNTER — Other Ambulatory Visit: Payer: Self-pay

## 2018-12-06 VITALS — BP 138/90 | HR 72 | Temp 98.2°F

## 2018-12-06 DIAGNOSIS — F418 Other specified anxiety disorders: Secondary | ICD-10-CM

## 2018-12-06 DIAGNOSIS — I10 Essential (primary) hypertension: Secondary | ICD-10-CM

## 2018-12-06 DIAGNOSIS — G4733 Obstructive sleep apnea (adult) (pediatric): Secondary | ICD-10-CM | POA: Diagnosis not present

## 2018-12-06 MED ORDER — LISINOPRIL 10 MG PO TABS: 10.0000 mg | ORAL_TABLET | Freq: Every day | ORAL | 3 refills | Status: DC

## 2018-12-06 MED ORDER — TRIAMCINOLONE ACETONIDE 0.1 % EX CREA: 1.0000 "application " | TOPICAL_CREAM | Freq: Two times a day (BID) | CUTANEOUS | 0 refills | Status: DC

## 2018-12-06 MED ORDER — SERTRALINE HCL 25 MG PO TABS: 25.0000 mg | ORAL_TABLET | Freq: Every day | ORAL | 1 refills | Status: DC

## 2018-12-06 NOTE — Progress Notes (Signed)
BP 138/90 (BP Location: Left Arm, Patient Position: Sitting)   Pulse 72   Temp 98.2 F (36.8 C) (Oral)   SpO2 98%    Subjective:    Patient ID: Joe Gutierrez, male    DOB: 03-30-1975, 43 y.o.   MRN: [id number]  HPI: Joe Gutierrez is a 43 y.o. male  Chief Complaint  Patient presents with  . Hypertension    4 week f/up- pt states he has been getting elevated BP readings at home, much like the one here today   HYPERTENSION Restarted BP medications last visit.  Lisinopril 5 MG.  Did go for sleep study at [place] about one week ago, no results noted yet in chart. Hypertension status: stable  Satisfied with current treatment? yes Duration of hypertension: chronic BP monitoring frequency:  daily BP range: 140-145/90-102 BP medication side effects:  no Medication compliance: good compliance Aspirin: no Recurrent headaches: no Visual changes: no Palpitations: no Dyspnea: no Chest pain: no Lower extremity edema: no Dizzy/lightheaded: no   GERD Having increased over the past 2 weeks.  Intermittent lasting a few minutes and gone.   GERD control status: stable  Satisfied with current treatment? yes Heartburn frequency: one to twice a week Previous GERD medications: none Antacid use frequency:  none Nature: epigastric burning Location: epigastric Heartburn duration: a few minutes Alleviatiating factors:  unknown Aggravating factors: certain foods Dysphagia: no Odynophagia:  no Hematemesis: no Blood in stool: no EGD: no   DEPRESSION Stopped taking Paxil and Buspar as reports these were causing dizziness, would like to try something different. Mood status: stable Satisfied with current treatment?: no Symptom severity: mild  Duration of current treatment : chronic Side effects: no Medication compliance: good compliance Psychotherapy/counseling: none Previous psychiatric medications: Paxil and Buspar Depressed mood: yes Anxious mood: yes Anhedonia: no  Significant weight loss or gain: no Insomnia: yes hard to fall asleep Fatigue: no Feelings of worthlessness or guilt: no Impaired concentration/indecisiveness: yes Suicidal ideations: no Hopelessness: no Crying spells: no Depression screen [facility] 2/9 10/29/2018 09/21/2017 03/20/2017 02/16/2017 01/13/2017  Decreased Interest 1 1 1 1 1   Down, Depressed, Hopeless 2 1 1 1 1   PHQ - 2 Score 3 2 2 2 2   Altered sleeping 2 2 1 2 2   Tired, decreased energy 2 2 1 2 2   Change in appetite 2 1 0 1 1  Feeling bad or failure about yourself  1 1 1 1 1   Trouble concentrating 1 1 0 1 1  Moving slowly or fidgety/restless 1 1 0 0 0  Suicidal thoughts 1 1 0 0 1  PHQ-9 Score 13 11 5 9 10   Difficult doing work/chores Somewhat difficult - - - -    Relevant past medical, surgical, family and social history reviewed and updated as indicated. Interim medical history since our last visit reviewed. Allergies and medications reviewed and updated.  Review of Systems  Constitutional: Negative for activity change, diaphoresis, fatigue and fever.  Respiratory: Negative for cough, chest tightness, shortness of breath and wheezing.   Cardiovascular: Negative for chest pain, palpitations and leg swelling.  Gastrointestinal: Negative for abdominal distention, abdominal pain, constipation, diarrhea, nausea and vomiting.  Neurological: Negative for dizziness, syncope, weakness, light-headedness, numbness and headaches.  Psychiatric/Behavioral: Positive for decreased concentration and sleep disturbance. Negative for self-injury and suicidal ideas. The patient is nervous/anxious.     Per HPI unless specifically indicated above     Objective:    BP 138/90 (BP Location: Left Arm, Patient  Position: Sitting)   Pulse 72   Temp 98.2 F (36.8 C) (Oral)   SpO2 98%   Wt Readings from Last 3 Encounters:  10/29/18 260 lb (117.9 kg)  09/21/17 256 lb 12.8 oz (116.5 kg)  06/29/17 250 lb (113.4 kg)    Physical Exam Vitals signs  and nursing note reviewed.  Constitutional:      General: He is awake. He is not in acute distress.    Appearance: He is well-developed and overweight. He is not ill-appearing.  HENT:     Head: Normocephalic and atraumatic.     Right Ear: Hearing normal. No drainage.     Left Ear: Hearing normal. No drainage.  Eyes:     General: Lids are normal.        Right eye: No discharge.        Left eye: No discharge.     Conjunctiva/sclera: Conjunctivae normal.     Pupils: Pupils are equal, round, and reactive to light.  Neck:     Musculoskeletal: Normal range of motion and neck supple.     Vascular: No carotid bruit.  Cardiovascular:     Rate and Rhythm: Normal rate and regular rhythm.     Heart sounds: Normal heart sounds, S1 normal and S2 normal. No murmur. No gallop.   Pulmonary:     Effort: Pulmonary effort is normal. No accessory muscle usage or respiratory distress.     Breath sounds: Normal breath sounds.  Abdominal:     General: Bowel sounds are normal.     Palpations: Abdomen is soft.  Musculoskeletal: Normal range of motion.     Right lower leg: No edema.     Left lower leg: No edema.  Skin:    General: Skin is warm and dry.  Neurological:     Mental Status: He is alert and oriented to person, place, and time.  Psychiatric:        Mood and Affect: Mood normal.        Behavior: Behavior normal. Behavior is cooperative.        Thought Content: Thought content normal.        Judgment: Judgment normal.     Results for orders placed or performed in visit on 10/29/18  HgB A1c  Result Value Ref Range   Hgb A1c MFr Bld 5.2 4.8 - 5.6 %   Est. average glucose Bld gHb Est-mCnc 103 mg/dL  CBC with Differential/Platelet  Result Value Ref Range   WBC 8.3 3.4 - 10.8 x10E3/uL   RBC 5.31 4.14 - 5.80 x10E6/uL   Hemoglobin 15.3 13.0 - 17.7 g/dL   Hematocrit 45.5 37.5 - 51.0 %   MCV 86 79 - 97 fL   MCH 28.8 26.6 - 33.0 pg   MCHC 33.6 31.5 - 35.7 g/dL   RDW 12.4 11.6 - 15.4 %    Platelets 228 150 - 450 x10E3/uL   Neutrophils 62 Not Estab. %   Lymphs 24 Not Estab. %   Monocytes 9 Not Estab. %   Eos 4 Not Estab. %   Basos 1 Not Estab. %   Neutrophils Absolute 5.2 1.4 - 7.0 x10E3/uL   Lymphocytes Absolute 2.0 0.7 - 3.1 x10E3/uL   Monocytes Absolute 0.7 0.1 - 0.9 x10E3/uL   EOS (ABSOLUTE) 0.3 0.0 - 0.4 x10E3/uL   Basophils Absolute 0.1 0.0 - 0.2 x10E3/uL   Immature Granulocytes 0 Not Estab. %   Immature Grans (Abs) 0.0 0.0 - 0.1 x10E3/uL  Comprehensive metabolic panel  Result Value Ref Range   Glucose 84 65 - 99 mg/dL   BUN 12 6 - 24 mg/dL   Creatinine, Ser 0.92 0.76 - 1.27 mg/dL   GFR calc non Af Amer 102 >59 mL/min/1.73   GFR calc Af Amer 117 >59 mL/min/1.73   BUN/Creatinine Ratio 13 9 - 20   Sodium 141 134 - 144 mmol/L   Potassium 4.3 3.5 - 5.2 mmol/L   Chloride 100 96 - 106 mmol/L   CO2 22 20 - 29 mmol/L   Calcium 9.0 8.7 - 10.2 mg/dL   Total Protein 7.1 6.0 - 8.5 g/dL   Albumin 4.7 4.0 - 5.0 g/dL   Globulin, Total 2.4 1.5 - 4.5 g/dL   Albumin/Globulin Ratio 2.0 1.2 - 2.2   Bilirubin Total 0.5 0.0 - 1.2 mg/dL   Alkaline Phosphatase 76 39 - 117 IU/L   AST 22 0 - 40 IU/L   ALT 28 0 - 44 IU/L  Lipid Panel w/o Chol/HDL Ratio  Result Value Ref Range   Cholesterol, Total 201 (H) 100 - 199 mg/dL   Triglycerides 89 0 - 149 mg/dL   HDL 39 (L) >39 mg/dL   VLDL Cholesterol Cal 16 5 - 40 mg/dL   LDL Chol Calc (NIH) 146 (H) 0 - 99 mg/dL  TSH  Result Value Ref Range   TSH 0.960 0.450 - 4.500 uIU/mL  PSA  Result Value Ref Range   Prostate Specific Ag, Serum 1.1 0.0 - 4.0 ng/mL  VITAMIN D 25 Hydroxy (Vit-D Deficiency, Fractures)  Result Value Ref Range   Vit D, 25-Hydroxy 28.2 (L) 30.0 - 100.0 ng/mL      Assessment & Plan:   Problem List Items Addressed This Visit      Cardiovascular and Mediastinum   Essential hypertension, benign - Primary    Chronic, ongoing with continued elevation.  Increase Lisinopril to 10 MG, script sent and to use remainder  of 5 MG tablets (two a day) until complete. Recommend checking BP at home daily and documenting.  BMP today.  Return in 4 weeks for BP follow-up.      Relevant Medications   lisinopril (ZESTRIL) 10 MG tablet   Other Relevant Orders   Basic Metabolic Panel (BMET)     Respiratory   Sleep apnea    Review results from [facility] once available.        Other   Depression with anxiety    Chronic, ongoing.  Dizziness with Paxil and Buspar. Denies SI/HI.  Will change to Zoloft and monitor for side effects.  Return in 4 weeks for follow-up.      Relevant Medications   sertraline (ZOLOFT) 25 MG tablet       Follow up plan: Return in about 4 weeks (around 01/03/2019) for Mood and HTN.

## 2018-12-06 NOTE — Patient Instructions (Addendum)
Food Choices for Gastroesophageal Reflux Disease, Adult When you have gastroesophageal reflux disease (GERD), the foods you eat and your eating habits are very important. Choosing the right foods can help ease your discomfort. Think about working with a nutrition specialist (dietitian) to help you make good choices. What are tips for following this plan?  Meals  Choose healthy foods that are low in fat, such as fruits, vegetables, whole grains, low-fat dairy products, and lean meat, fish, and poultry.  Eat small meals often instead of 3 large meals a day. Eat your meals slowly, and in a place where you are relaxed. Avoid bending over or lying down until 2-3 hours after eating.  Avoid eating meals 2-3 hours before bed.  Avoid drinking a lot of liquid with meals.  Cook foods using methods other than frying. Bake, grill, or broil food instead.  Avoid or limit: ? Chocolate. ? Peppermint or spearmint. ? Alcohol. ? Pepper. ? Black and decaffeinated coffee. ? Black and decaffeinated tea. ? Bubbly (carbonated) soft drinks. ? Caffeinated energy drinks and soft drinks.  Limit high-fat foods such as: ? Fatty meat or fried foods. ? Whole milk, cream, butter, or ice cream. ? Nuts and nut butters. ? Pastries, donuts, and sweets made with butter or shortening.  Avoid foods that cause symptoms. These foods may be different for everyone. Common foods that cause symptoms include: ? Tomatoes. ? Oranges, lemons, and limes. ? Peppers. ? Spicy food. ? Onions and garlic. ? Vinegar. Lifestyle  Maintain a healthy weight. Ask your doctor what weight is healthy for you. If you need to lose weight, work with your doctor to do so safely.  Exercise for at least 30 minutes for 5 or more days each week, or as told by your doctor.  Wear loose-fitting clothes.  Do not smoke. If you need help quitting, ask your doctor.  Sleep with the head of your bed higher than your feet. Use a wedge under the  mattress or blocks under the bed frame to raise the head of the bed. Summary  When you have gastroesophageal reflux disease (GERD), food and lifestyle choices are very important in easing your symptoms.  Eat small meals often instead of 3 large meals a day. Eat your meals slowly, and in a place where you are relaxed.  Limit high-fat foods such as fatty meat or fried foods.  Avoid bending over or lying down until 2-3 hours after eating.  Avoid peppermint and spearmint, caffeine, alcohol, and chocolate. This information is not intended to replace advice given to you by your health care provider. Make sure you discuss any questions you have with your health care provider. Document Released: 08/05/2011 Document Revised: 05/27/2018 Document Reviewed: 03/11/2016 Elsevier Patient Education  [place].  Sertraline tablets What is this medicine? SERTRALINE (SER tra leen) is used to treat depression. It may also be used to treat obsessive compulsive disorder, panic disorder, post-trauma stress, premenstrual dysphoric disorder (PMDD) or social anxiety. This medicine may be used for other purposes; ask your health care provider or pharmacist if you have questions. COMMON BRAND NAME(S): Zoloft What should I tell my health care provider before I take this medicine? They need to know if you have any of these conditions:  bleeding disorders  bipolar disorder or a family history of bipolar disorder  glaucoma  heart disease  high blood pressure  history of irregular heartbeat  history of low levels of calcium, magnesium, or potassium in the blood  if you  often drink alcohol  liver disease  receiving electroconvulsive therapy  seizures  suicidal thoughts, plans, or attempt; a previous suicide attempt by you or a family member  take medicines that treat or prevent blood clots  thyroid disease  an unusual or allergic reaction to sertraline, other medicines, foods, dyes, or  preservatives  pregnant or trying to get pregnant  breast-feeding How should I use this medicine? Take this medicine by mouth with a glass of water. Follow the directions on the prescription label. You can take it with or without food. Take your medicine at regular intervals. Do not take your medicine more often than directed. Do not stop taking this medicine suddenly except upon the advice of your doctor. Stopping this medicine too quickly may cause serious side effects or your condition may worsen. A special MedGuide will be given to you by the pharmacist with each prescription and refill. Be sure to read this information carefully each time. Talk to your pediatrician regarding the use of this medicine in children. While this drug may be prescribed for children as young as 7 years for selected conditions, precautions do apply. Overdosage: If you think you have taken too much of this medicine contact a poison control center or emergency room at once. NOTE: This medicine is only for you. Do not share this medicine with others. What if I miss a dose? If you miss a dose, take it as soon as you can. If it is almost time for your next dose, take only that dose. Do not take double or extra doses. What may interact with this medicine? Do not take this medicine with any of the following medications:  cisapride  dronedarone  linezolid  MAOIs like Carbex, Eldepryl, Marplan, Nardil, and Parnate  methylene blue (injected into a vein)  pimozide  thioridazine This medicine may also interact with the following medications:  alcohol  amphetamines  aspirin and aspirin-like medicines  certain medicines for depression, anxiety, or psychotic disturbances  certain medicines for fungal infections like ketoconazole, fluconazole, posaconazole, and itraconazole  certain medicines for irregular heart beat like flecainide, quinidine, propafenone  certain medicines for migraine headaches like  almotriptan, eletriptan, frovatriptan, naratriptan, rizatriptan, sumatriptan, zolmitriptan  certain medicines for sleep  certain medicines for seizures like carbamazepine, valproic acid, phenytoin  certain medicines that treat or prevent blood clots like warfarin, enoxaparin, dalteparin  cimetidine  digoxin  diuretics  fentanyl  isoniazid  lithium  NSAIDs, medicines for pain and inflammation, like ibuprofen or naproxen  other medicines that prolong the QT interval (cause an abnormal heart rhythm) like dofetilide  rasagiline  safinamide  supplements like St. John's wort, kava kava, valerian  tolbutamide  tramadol  tryptophan This list may not describe all possible interactions. Give your health care provider a list of all the medicines, herbs, non-prescription drugs, or dietary supplements you use. Also tell them if you smoke, drink alcohol, or use illegal drugs. Some items may interact with your medicine. What should I watch for while using this medicine? Tell your doctor if your symptoms do not get better or if they get worse. Visit your doctor or health care professional for regular checks on your progress. Because it may take several weeks to see the full effects of this medicine, it is important to continue your treatment as prescribed by your doctor. Patients and their families should watch out for new or worsening thoughts of suicide or depression. Also watch out for sudden changes in feelings such as feeling anxious, agitated,  panicky, irritable, hostile, aggressive, impulsive, severely restless, overly excited and hyperactive, or not being able to sleep. If this happens, especially at the beginning of treatment or after a change in dose, call your health care professional. Dennis Bast may get drowsy or dizzy. Do not drive, use machinery, or do anything that needs mental alertness until you know how this medicine affects you. Do not stand or sit up quickly, especially if you are  an older patient. This reduces the risk of dizzy or fainting spells. Alcohol may interfere with the effect of this medicine. Avoid alcoholic drinks. Your mouth may get dry. Chewing sugarless gum or sucking hard candy, and drinking plenty of water may help. Contact your doctor if the problem does not go away or is severe. What side effects may I notice from receiving this medicine? Side effects that you should report to your doctor or health care professional as soon as possible:  allergic reactions like skin rash, itching or hives, swelling of the face, lips, or tongue  anxious  black, tarry stools  changes in vision  confusion  elevated mood, decreased need for sleep, racing thoughts, impulsive behavior  eye pain  fast, irregular heartbeat  feeling faint or lightheaded, falls  feeling agitated, angry, or irritable  hallucination, loss of contact with reality  loss of balance or coordination  loss of memory  painful or prolonged erections  restlessness, pacing, inability to keep still  seizures  stiff muscles  suicidal thoughts or other mood changes  trouble sleeping  unusual bleeding or bruising  unusually weak or tired  vomiting Side effects that usually do not require medical attention (report to your doctor or health care professional if they continue or are bothersome):  change in appetite or weight  change in sex drive or performance  diarrhea  increased sweating  indigestion, nausea  tremors This list may not describe all possible side effects. Call your doctor for medical advice about side effects. You may report side effects to FDA at 1-800-FDA-1088. Where should I keep my medicine? Keep out of the reach of children. Store at room temperature between 15 and 30 degrees C (59 and 86 degrees F). Throw away any unused medicine after the expiration date. NOTE: This sheet is a summary. It may not cover all possible information. If you have questions  about this medicine, talk to your doctor, pharmacist, or health care provider.  2020 Elsevier/Gold Standard (2018-01-26 10:09:27)

## 2018-12-06 NOTE — Assessment & Plan Note (Signed)
Chronic, ongoing with continued elevation.  Increase Lisinopril to 10 MG, script sent and to use remainder of 5 MG tablets (two a day) until complete. Recommend checking BP at home daily and documenting.  BMP today.  Return in 4 weeks for BP follow-up.

## 2018-12-06 NOTE — Assessment & Plan Note (Signed)
Review results from [facility] once available.

## 2018-12-06 NOTE — Assessment & Plan Note (Signed)
Chronic, ongoing.  Dizziness with Paxil and Buspar. Denies SI/HI.  Will change to Zoloft and monitor for side effects.  Return in 4 weeks for follow-up.

## 2018-12-07 LAB — BASIC METABOLIC PANEL
BUN/Creatinine Ratio: 12 (ref 9–20)
BUN: 11 mg/dL (ref 6–24)
CO2: 24 mmol/L (ref 20–29)
Calcium: 9.4 mg/dL (ref 8.7–10.2)
Chloride: 103 mmol/L (ref 96–106)
Creatinine, Ser: 0.93 mg/dL (ref 0.76–1.27)
GFR calc Af Amer: 116 mL/min/{1.73_m2} (ref >59)
GFR calc non Af Amer: 100 mL/min/{1.73_m2} (ref >59)
Glucose: 87 mg/dL (ref 65–99)
Potassium: 4.4 mmol/L (ref 3.5–5.2)
Sodium: 141 mmol/L (ref 134–144)

## 2018-12-07 NOTE — Progress Notes (Signed)
Normal test results noted.  Please call patient and make them aware of normal results and will continue to monitor at regular visits.  Have a great day.  Look forward to seeing you at your next visit.

## 2019-01-03 ENCOUNTER — Other Ambulatory Visit: Payer: Self-pay

## 2019-01-03 ENCOUNTER — Encounter: Payer: Self-pay | Admitting: Nurse Practitioner

## 2019-01-03 ENCOUNTER — Ambulatory Visit (INDEPENDENT_AMBULATORY_CARE_PROVIDER_SITE_OTHER): Payer: Managed Care, Other (non HMO) | Admitting: Nurse Practitioner

## 2019-01-03 VITALS — BP 124/89 | HR 64 | Temp 99.1°F | Ht 69.2 in | Wt 263.0 lb

## 2019-01-03 DIAGNOSIS — F418 Other specified anxiety disorders: Secondary | ICD-10-CM

## 2019-01-03 DIAGNOSIS — I10 Essential (primary) hypertension: Secondary | ICD-10-CM

## 2019-01-03 DIAGNOSIS — Z6838 Body mass index (BMI) 38.0-38.9, adult: Secondary | ICD-10-CM

## 2019-01-03 DIAGNOSIS — E6609 Other obesity due to excess calories: Secondary | ICD-10-CM

## 2019-01-03 MED ORDER — SERTRALINE HCL 50 MG PO TABS: 50.0000 mg | ORAL_TABLET | Freq: Every day | ORAL | 1 refills | Status: DC

## 2019-01-03 NOTE — Patient Instructions (Signed)
Living With Depression Everyone experiences occasional disappointment, sadness, and loss in their lives. When you are feeling down, blue, or sad for at least 2 weeks in a row, it may mean that you have depression. Depression can affect your thoughts and feelings, relationships, daily activities, and physical health. It is caused by changes in the way your brain functions. If you receive a diagnosis of depression, your health care provider will tell you which type of depression you have and what treatment options are available to you. If you are living with depression, there are ways to help you recover from it and also ways to prevent it from coming back. How to cope with lifestyle changes Coping with stress     Stress is your body's reaction to life changes and events, both good and bad. Stressful situations may include:  Getting married.  The death of a spouse.  Losing a job.  Retiring.  Having a baby. Stress can last just a few hours or it can be ongoing. Stress can play a major role in depression, so it is important to learn both how to cope with stress and how to think about it differently. Talk with your health care provider or a counselor if you would like to learn more about stress reduction. He or she may suggest some stress reduction techniques, such as:  Music therapy. This can include creating music or listening to music. Choose music that you enjoy and that inspires you.  Mindfulness-based meditation. This kind of meditation can be done while sitting or walking. It involves being aware of your normal breaths, rather than trying to control your breathing.  Centering prayer. This is a kind of meditation that involves focusing on a spiritual word or phrase. Choose a word, phrase, or sacred image that is meaningful to you and that brings you peace.  Deep breathing. To do this, expand your stomach and inhale slowly through your nose. Hold your breath for 3-5 seconds, then exhale  slowly, allowing your stomach muscles to relax.  Muscle relaxation. This involves intentionally tensing muscles then relaxing them. Choose a stress reduction technique that fits your lifestyle and personality. Stress reduction techniques take time and practice to develop. Set aside 5-15 minutes a day to do them. Therapists can offer training in these techniques. The training may be covered by some insurance plans. Other things you can do to manage stress include:  Keeping a stress diary. This can help you learn what triggers your stress and ways to control your response.  Understanding what your limits are and saying no to requests or events that lead to a schedule that is too full.  Thinking about how you respond to certain situations. You may not be able to control everything, but you can control how you react.  Adding humor to your life by watching funny films or TV shows.  Making time for activities that help you relax and not feeling guilty about spending your time this way.  Medicines Your health care provider may suggest certain medicines if he or she feels that they will help improve your condition. Avoid using alcohol and other substances that may prevent your medicines from working properly (may interact). It is also important to:  Talk with your pharmacist or health care provider about all the medicines that you take, their possible side effects, and what medicines are safe to take together.  Make it your goal to take part in all treatment decisions (shared decision-making). This includes giving input on   the side effects of medicines. It is best if shared decision-making with your health care provider is part of your total treatment plan. If your health care provider prescribes a medicine, you may not notice the full benefits of it for 4-8 weeks. Most people who are treated for depression need to be on medicine for at least 6-12 months after they feel better. If you are taking  medicines as part of your treatment, do not stop taking medicines without first talking to your health care provider. You may need to have the medicine slowly decreased (tapered) over time to decrease the risk of harmful side effects. Relationships Your health care provider may suggest family therapy along with individual therapy and drug therapy. While there may not be family problems that are causing you to feel depressed, it is still important to make sure your family learns as much as they can about your mental health. Having your family's support can help make your treatment successful. How to recognize changes in your condition Everyone has a different response to treatment for depression. Recovery from major depression happens when you have not had signs of major depression for two months. This may mean that you will start to:  Have more interest in doing activities.  Feel less hopeless than you did 2 months ago.  Have more energy.  Overeat less often, or have better or improving appetite.  Have better concentration. Your health care provider will work with you to decide the next steps in your recovery. It is also important to recognize when your condition is getting worse. Watch for these signs:  Having fatigue or low energy.  Eating too much or too little.  Sleeping too much or too little.  Feeling restless, agitated, or hopeless.  Having trouble concentrating or making decisions.  Having unexplained physical complaints.  Feeling irritable, angry, or aggressive. Get help as soon as you or your family members notice these symptoms coming back. How to get support and help from others How to talk with friends and family members about your condition  Talking to friends and family members about your condition can provide you with one way to get support and guidance. Reach out to trusted friends or family members, explain your symptoms to them, and let them know that you are  working with a health care provider to treat your depression. Financial resources Not all insurance plans cover mental health care, so it is important to check with your insurance carrier. If paying for co-pays or counseling services is a problem, search for a local or county mental health care center. They may be able to offer public mental health care services at low or no cost when you are not able to see a private health care provider. If you are taking medicine for depression, you may be able to get the generic form, which may be less expensive. Some makers of prescription medicines also offer help to patients who cannot afford the medicines they need. Follow these instructions at home:   Get the right amount and quality of sleep.  Cut down on using caffeine, tobacco, alcohol, and other potentially harmful substances.  Try to exercise, such as walking or lifting small weights.  Take over-the-counter and prescription medicines only as told by your health care provider.  Eat a healthy diet that includes plenty of vegetables, fruits, whole grains, low-fat dairy products, and lean protein. Do not eat a lot of foods that are high in solid fats, added sugars, or salt.    Keep all follow-up visits as told by your health care provider. This is important. Contact a health care provider if:  You stop taking your antidepressant medicines, and you have any of these symptoms: ? Nausea. ? Headache. ? Feeling lightheaded. ? Chills and body aches. ? Not being able to sleep (insomnia).  You or your friends and family think your depression is getting worse. Get help right away if:  You have thoughts of hurting yourself or others. If you ever feel like you may hurt yourself or others, or have thoughts about taking your own life, get help right away. You can go to your nearest emergency department or call:  Your local emergency services (911 in the [place]  A suicide crisis helpline, such as the  National Suicide Prevention Lifeline at 1-800-273-8255. This is open 24-hours a day. Summary  If you are living with depression, there are ways to help you recover from it and also ways to prevent it from coming back.  Work with your health care team to create a management plan that includes counseling, stress management techniques, and healthy lifestyle habits. This information is not intended to replace advice given to you by your health care provider. Make sure you discuss any questions you have with your health care provider. Document Released: 01/07/2016 Document Revised: 05/28/2018 Document Reviewed: 01/07/2016 Elsevier Patient Education  2020 [employer]  

## 2019-01-03 NOTE — Assessment & Plan Note (Signed)
Recommend continued focus on health diet choices and regular physical activity (30 minutes 5 days a week). 

## 2019-01-03 NOTE — Progress Notes (Signed)
BP 124/89   Pulse 64   Temp 99.1 F (37.3 C) (Oral)   Ht 5' 9.2" (1.758 m)   Wt 263 lb (119.3 kg)   SpO2 97%   BMI 38.61 kg/m    Subjective:    Patient ID: Joe Gutierrez, male    DOB: [birth year], 43 y.o.   MRN: [id number]  HPI: Joe Gutierrez is a 43 y.o. male  Chief Complaint  Patient presents with  . Hypertension  . Depression   HYPERTENSION Increase Lisinopril to 10 MG at last visit. Hypertension status: stable  Satisfied with current treatment? yes Duration of hypertension: chronic BP monitoring frequency:  a few times a week BP range: 130/90 and 120/70 range at home BP medication side effects:  no Medication compliance: good compliance Aspirin: no Recurrent headaches: no Visual changes: no Palpitations: no Dyspnea: no Chest pain: no Lower extremity edema: no Dizzy/lightheaded: no   DEPRESSION Switched to Zoloft at last visit 25 MG.  Reports this is working well, but still occasional angry moments. Mood status: stable Satisfied with current treatment?: yes Symptom severity: moderate  Duration of current treatment : chronic Side effects: no Medication compliance: good compliance Psychotherapy/counseling: none Depressed mood: yes Anxious mood: no Anhedonia: no Significant weight loss or gain: no Insomnia: none Fatigue: no Feelings of worthlessness or guilt: no Impaired concentration/indecisiveness: yes Suicidal ideations: no Hopelessness: no Crying spells: no Depression screen [facility] 2/9 01/03/2019 10/29/2018 09/21/2017 03/20/2017 02/16/2017  Decreased Interest 1 1 1 1 1   Down, Depressed, Hopeless 1 2 1 1 1   PHQ - 2 Score 2 3 2 2 2   Altered sleeping 3 2 2 1 2   Tired, decreased energy 3 2 2 1 2   Change in appetite 1 2 1  0 1  Feeling bad or failure about yourself  2 1 1 1 1   Trouble concentrating 1 1 1  0 1  Moving slowly or fidgety/restless 0 1 1 0 0  Suicidal thoughts 1 1 1  0 0  PHQ-9 Score 13 13 11 5 9   Difficult doing work/chores Somewhat  difficult Somewhat difficult - - -   GAD 7 : Generalized Anxiety Score 10/29/2018  Nervous, Anxious, on Edge 2  Control/stop worrying 1  Worry too much - different things 1  Trouble relaxing 1  Restless 2  Easily annoyed or irritable 2  Afraid - awful might happen 1  Total GAD 7 Score 10  Anxiety Difficulty Somewhat difficult    Relevant past medical, surgical, family and social history reviewed and updated as indicated. Interim medical history since our last visit reviewed. Allergies and medications reviewed and updated.  Review of Systems  Constitutional: Negative for activity change, diaphoresis, fatigue and fever.  Respiratory: Negative for cough, chest tightness, shortness of breath and wheezing.   Cardiovascular: Negative for chest pain, palpitations and leg swelling.  Gastrointestinal: Negative for abdominal distention, abdominal pain, constipation, diarrhea, nausea and vomiting.  Endocrine: Negative for cold intolerance, heat intolerance, polydipsia, polyphagia and polyuria.  Neurological: Negative for dizziness, syncope, weakness, light-headedness, numbness and headaches.  Psychiatric/Behavioral: Negative.     Per HPI unless specifically indicated above     Objective:    BP 124/89   Pulse 64   Temp 99.1 F (37.3 C) (Oral)   Ht 5' 9.2" (1.758 m)   Wt 263 lb (119.3 kg)   SpO2 97%   BMI 38.61 kg/m   Wt Readings from Last 3 Encounters:  01/03/19 263 lb (119.3 kg)  10/29/18 260 lb (117.9  kg)  09/21/17 256 lb 12.8 oz (116.5 kg)    Physical Exam Vitals signs and nursing note reviewed.  Constitutional:      General: He is awake. He is not in acute distress.    Appearance: He is well-developed. He is obese. He is not ill-appearing.  HENT:     Head: Normocephalic and atraumatic.     Right Ear: Hearing normal. No drainage.     Left Ear: Hearing normal. No drainage.  Eyes:     General: Lids are normal.        Right eye: No discharge.        Left eye: No  discharge.     Conjunctiva/sclera: Conjunctivae normal.     Pupils: Pupils are equal, round, and reactive to light.  Neck:     Musculoskeletal: Normal range of motion and neck supple.     Thyroid: No thyromegaly.     Vascular: No carotid bruit.  Cardiovascular:     Rate and Rhythm: Normal rate and regular rhythm.     Heart sounds: Normal heart sounds, S1 normal and S2 normal. No murmur. No gallop.   Pulmonary:     Effort: Pulmonary effort is normal. No accessory muscle usage or respiratory distress.     Breath sounds: Normal breath sounds.  Abdominal:     General: Bowel sounds are normal.     Palpations: Abdomen is soft.  Musculoskeletal: Normal range of motion.     Right lower leg: No edema.     Left lower leg: No edema.  Skin:    General: Skin is warm and dry.  Neurological:     Mental Status: He is alert and oriented to person, place, and time.  Psychiatric:        Attention and Perception: Attention normal.        Mood and Affect: Mood normal.        Speech: Speech normal.        Behavior: Behavior normal. Behavior is cooperative.        Thought Content: Thought content normal.        Judgment: Judgment normal.     Results for orders placed or performed in visit on [id number]  Basic Metabolic Panel (BMET)  Result Value Ref Range   Glucose 87 65 - 99 mg/dL   BUN 11 6 - 24 mg/dL   Creatinine, Ser 0.93 0.76 - 1.27 mg/dL   GFR calc non Af Amer 100 >59 mL/min/1.73   GFR calc Af Amer 116 >59 mL/min/1.73   BUN/Creatinine Ratio 12 9 - 20   Sodium 141 134 - 144 mmol/L   Potassium 4.4 3.5 - 5.2 mmol/L   Chloride 103 96 - 106 mmol/L   CO2 24 20 - 29 mmol/L   Calcium 9.4 8.7 - 10.2 mg/dL      Assessment & Plan:   Problem List Items Addressed This Visit      Cardiovascular and Mediastinum   Essential hypertension, benign - Primary    Chronic, stable with BP at goal today.  Continue Lisinopril at 10 MG and adjust as needed.  Continue to check BP at home regularly and notify  provider if elevations.  BMP today to recheck kidney function with dose increase.        Relevant Orders   Basic Metabolic Panel (BMET)     Other   Depression with anxiety    Chronic, ongoing.  Continues to have some irritability.  Will increase Sertraline to 50 MG,  script sent.  Denies SI/HI.  Return in 4 weeks for follow-up and adjust dose as needed.      Relevant Medications   sertraline (ZOLOFT) 50 MG tablet   Obesity    Recommend continued focus on health diet choices and regular physical activity (30 minutes 5 days a week).           Follow up plan: Return in about 4 weeks (around 01/31/2019) for Mood follow-up.

## 2019-01-03 NOTE — Assessment & Plan Note (Signed)
Chronic, stable with BP at goal today.  Continue Lisinopril at 10 MG and adjust as needed.  Continue to check BP at home regularly and notify provider if elevations.  BMP today to recheck kidney function with dose increase.

## 2019-01-03 NOTE — Assessment & Plan Note (Signed)
Chronic, ongoing.  Continues to have some irritability.  Will increase Sertraline to 50 MG, script sent.  Denies SI/HI.  Return in 4 weeks for follow-up and adjust dose as needed.

## 2019-01-04 LAB — BASIC METABOLIC PANEL
BUN/Creatinine Ratio: 14 (ref 9–20)
BUN: 12 mg/dL (ref 6–24)
CO2: 23 mmol/L (ref 20–29)
Calcium: 9 mg/dL (ref 8.7–10.2)
Chloride: 104 mmol/L (ref 96–106)
Creatinine, Ser: 0.86 mg/dL (ref 0.76–1.27)
GFR calc Af Amer: 123 mL/min/{1.73_m2} (ref >59)
GFR calc non Af Amer: 106 mL/min/{1.73_m2} (ref >59)
Glucose: 91 mg/dL (ref 65–99)
Potassium: 4.9 mmol/L (ref 3.5–5.2)
Sodium: 142 mmol/L (ref 134–144)

## 2019-01-04 NOTE — Progress Notes (Signed)
Normal test results noted.  Please call patient and make them aware of normal results and will continue to monitor at regular visits.  Have a great day.  Look forward to seeing you at your next visit.

## 2019-01-18 ENCOUNTER — Telehealth: Payer: Managed Care, Other (non HMO) | Admitting: Nurse Practitioner

## 2019-01-31 ENCOUNTER — Ambulatory Visit (INDEPENDENT_AMBULATORY_CARE_PROVIDER_SITE_OTHER): Payer: Managed Care, Other (non HMO) | Admitting: Nurse Practitioner

## 2019-01-31 ENCOUNTER — Other Ambulatory Visit: Payer: Self-pay

## 2019-01-31 ENCOUNTER — Encounter: Payer: Self-pay | Admitting: Nurse Practitioner

## 2019-01-31 DIAGNOSIS — F418 Other specified anxiety disorders: Secondary | ICD-10-CM

## 2019-01-31 MED ORDER — SERTRALINE HCL 100 MG PO TABS: 100.0000 mg | ORAL_TABLET | Freq: Every day | ORAL | 3 refills | Status: DC

## 2019-01-31 NOTE — Patient Instructions (Signed)

## 2019-01-31 NOTE — Progress Notes (Signed)
BP (!) 132/98   Temp 98.1 F (36.7 C)    Subjective:    Patient ID: Joe Gutierrez, male    DOB: 09/08/1975, 43 y.o.   MRN: [id number]  HPI: Joe Gutierrez is a 43 y.o. male  Chief Complaint  Patient presents with  . Depression    . This visit was completed via Doximity due to the restrictions of the COVID-19 pandemic. All issues as above were discussed and addressed. Physical exam was done as above through visual confirmation on Doximity. If it was felt that the patient should be evaluated in the office, they were directed there. The patient verbally consented to this visit. . Location of the patient: home . Location of the provider: work . Those involved with this call:  . Provider: Marnee Guarneri, DNP . CMA: Yvonna Alanis, CMA . Front Desk/Registration: Jill Side  . Time spent on call: 15 minutes with patient face to face via video conference. More than 50% of this time was spent in counseling and coordination of care. 10 minutes total spent in review of patient's record and preparation of their chart.  . I verified patient identity using two factors (patient name and date of birth). Patient consents verbally to being seen via telemedicine visit today.    DEPRESSION Sertraline was increased to 50 MG in November, last visit.  Feels this has helped, but still having occasional tough days.  Is interested in increasing some more.   Mood status: stable Satisfied with current treatment?: yes Symptom severity: moderate  Duration of current treatment : chronic Side effects: no Medication compliance: good compliance Psychotherapy/counseling: none Previous psychiatric medications: zoloft Depressed mood: yes Anxious mood: yes Anhedonia: no Significant weight loss or gain: no Insomnia: yes hard to fall asleep Fatigue: occasional Feelings of worthlessness or guilt: a little bit Impaired concentration/indecisiveness: yes Suicidal ideations: no Hopelessness: no Crying  spells: no Depression screen [facility] 2/9 01/31/2019 01/03/2019 10/29/2018 09/21/2017 03/20/2017  Decreased Interest 0 1 1 1 1   Down, Depressed, Hopeless 1 1 2 1 1   PHQ - 2 Score 1 2 3 2 2   Altered sleeping 1 3 2 2 1   Tired, decreased energy 1 3 2 2 1   Change in appetite 1 1 2 1  0  Feeling bad or failure about yourself  1 2 1 1 1   Trouble concentrating 1 1 1 1  0  Moving slowly or fidgety/restless 0 0 1 1 0  Suicidal thoughts 0 1 1 1  0  PHQ-9 Score 6 13 13 11 5   Difficult doing work/chores Not difficult at all Somewhat difficult Somewhat difficult - -  Some recent data might be hidden    Relevant past medical, surgical, family and social history reviewed and updated as indicated. Interim medical history since our last visit reviewed. Allergies and medications reviewed and updated.  Review of Systems  Constitutional: Negative for activity change, diaphoresis, fatigue and fever.  Respiratory: Negative.   Cardiovascular: Negative.   Gastrointestinal: Negative.   Neurological: Negative.   Psychiatric/Behavioral: Positive for decreased concentration and sleep disturbance. Negative for self-injury and suicidal ideas. The patient is nervous/anxious.     Per HPI unless specifically indicated above     Objective:    BP (!) 132/98   Temp 98.1 F (36.7 C)   Wt Readings from Last 3 Encounters:  01/03/19 263 lb (119.3 kg)  10/29/18 260 lb (117.9 kg)  09/21/17 256 lb 12.8 oz (116.5 kg)    Physical Exam Vitals and nursing note  reviewed.  Constitutional:      General: He is awake. He is not in acute distress.    Appearance: He is well-developed. He is not ill-appearing.  HENT:     Head: Normocephalic.     Right Ear: Hearing normal. No drainage.     Left Ear: Hearing normal. No drainage.  Eyes:     General: Lids are normal.        Right eye: No discharge.        Left eye: No discharge.     Conjunctiva/sclera: Conjunctivae normal.  Pulmonary:     Effort: Pulmonary effort is normal. No  accessory muscle usage or respiratory distress.  Musculoskeletal:     Cervical back: Normal range of motion.  Neurological:     Mental Status: He is alert and oriented to person, place, and time.  Psychiatric:        Mood and Affect: Mood normal.        Behavior: Behavior normal. Behavior is cooperative.        Thought Content: Thought content normal.        Judgment: Judgment normal.     Results for orders placed or performed in visit on [id number]  Basic Metabolic Panel (BMET)  Result Value Ref Range   Glucose 91 65 - 99 mg/dL   BUN 12 6 - 24 mg/dL   Creatinine, Ser 0.86 0.76 - 1.27 mg/dL   GFR calc non Af Amer 106 >59 mL/min/1.73   GFR calc Af Amer 123 >59 mL/min/1.73   BUN/Creatinine Ratio 14 9 - 20   Sodium 142 134 - 144 mmol/L   Potassium 4.9 3.5 - 5.2 mmol/L   Chloride 104 96 - 106 mmol/L   CO2 23 20 - 29 mmol/L   Calcium 9.0 8.7 - 10.2 mg/dL      Assessment & Plan:   Problem List Items Addressed This Visit      Other   Depression with anxiety    Chronic, ongoing.  Continues to have some irritability.  Will increase Sertraline to 100 MG, script sent.  Denies SI/HI.  Return in 4 weeks for follow-up and adjust dose as needed.      Relevant Medications   sertraline (ZOLOFT) 100 MG tablet      I discussed the assessment and treatment plan with the patient. The patient was provided an opportunity to ask questions and all were answered. The patient agreed with the plan and demonstrated an understanding of the instructions.   The patient was advised to call back or seek an in-person evaluation if the symptoms worsen or if the condition fails to improve as anticipated.   I provided 15 minutes of time during this encounter.  Follow up plan: Return in about 4 weeks (around 02/28/2019) for Mood and HTN.

## 2019-01-31 NOTE — Assessment & Plan Note (Signed)
Chronic, ongoing.  Continues to have some irritability.  Will increase Sertraline to 100 MG, script sent.  Denies SI/HI.  Return in 4 weeks for follow-up and adjust dose as needed.

## 2019-03-01 ENCOUNTER — Ambulatory Visit (INDEPENDENT_AMBULATORY_CARE_PROVIDER_SITE_OTHER): Payer: Managed Care, Other (non HMO) | Admitting: Nurse Practitioner

## 2019-03-01 ENCOUNTER — Other Ambulatory Visit: Payer: Self-pay

## 2019-03-01 ENCOUNTER — Encounter: Payer: Self-pay | Admitting: Nurse Practitioner

## 2019-03-01 DIAGNOSIS — F418 Other specified anxiety disorders: Secondary | ICD-10-CM

## 2019-03-01 DIAGNOSIS — I10 Essential (primary) hypertension: Secondary | ICD-10-CM

## 2019-03-01 MED ORDER — LISINOPRIL 20 MG PO TABS: 20.0000 mg | ORAL_TABLET | Freq: Every day | ORAL | 3 refills | Status: DC

## 2019-03-01 MED ORDER — SERTRALINE HCL 100 MG PO TABS: 100.0000 mg | ORAL_TABLET | Freq: Every day | ORAL | 3 refills | Status: DC

## 2019-03-01 MED ORDER — SERTRALINE HCL 25 MG PO TABS: 25.0000 mg | ORAL_TABLET | Freq: Every day | ORAL | 3 refills | Status: DC

## 2019-03-01 NOTE — Assessment & Plan Note (Signed)
Chronic, slightly above goal on home readings.  Increase Lisinopril to 20 MG and adjust as needed.  Continue to check BP at home regularly and notify provider if elevations.  BMP next visit to assess.  Return in 4 weeks.

## 2019-03-01 NOTE — Progress Notes (Signed)
BP (!) 132/102   Temp 98.2 F (36.8 C)    Subjective:    Patient ID: Joe Gutierrez, male    DOB: 1975-08-30, 44 y.o.   MRN: [id number]  HPI: Joe Gutierrez is a 44 y.o. male  Chief Complaint  Patient presents with  . Follow-up    Mood and HTN.    . This visit was completed via Doximity due to the restrictions of the COVID-19 pandemic. All issues as above were discussed and addressed. Physical exam was done as above through visual confirmation on Doximity. If it was felt that the patient should be evaluated in the office, they were directed there. The patient verbally consented to this visit. . Location of the patient: home . Location of the provider: work . Those involved with this call:  . Provider: Marnee Guarneri, DNP . CMA: Yvonna Alanis, CMA . Front Desk/Registration: Don Perking  . Time spent on call: 15 minutes with patient face to face via video conference. More than 50% of this time was spent in counseling and coordination of care. 10 minutes total spent in review of patient's record and preparation of their chart.  . I verified patient identity using two factors (patient name and date of birth). Patient consents verbally to being seen via telemedicine visit today.    DEPRESSION Increased Sertraline last visit to 100 MG.  Reports it is "definitely a lot better".  Still occasional bad days, this month had 6 bad days. On bad days, does get angry fast.   Mood status: stable Satisfied with current treatment?: yes Symptom severity: moderate  Duration of current treatment : chronic Side effects: no Medication compliance: good compliance Psychotherapy/counseling: none Previous psychiatric medications: Sertraline Depressed mood: yes Anxious mood: yes Anhedonia: no Significant weight loss or gain: no Insomnia: yes hard to fall asleep Fatigue: no Feelings of worthlessness or guilt: sometimes Impaired concentration/indecisiveness: no Suicidal ideations:  no Hopelessness: no Crying spells: no Depression screen [facility] 2/9 01/31/2019 01/03/2019 10/29/2018 09/21/2017 03/20/2017  Decreased Interest 0 1 1 1 1   Down, Depressed, Hopeless 1 1 2 1 1   PHQ - 2 Score 1 2 3 2 2   Altered sleeping 1 3 2 2 1   Tired, decreased energy 1 3 2 2 1   Change in appetite 1 1 2 1  0  Feeling bad or failure about yourself  1 2 1 1 1   Trouble concentrating 1 1 1 1  0  Moving slowly or fidgety/restless 0 0 1 1 0  Suicidal thoughts 0 1 1 1  0  PHQ-9 Score 6 13 13 11 5   Difficult doing work/chores Not difficult at all Somewhat difficult Somewhat difficult - -  Some recent data might be hidden   HYPERTENSION Continues on Lisinopril 10 MG. Hypertension status: stable  Satisfied with current treatment? yes Duration of hypertension: chronic BP monitoring frequency:  a few times a week BP range: 130/90-100  BP medication side effects:  no Medication compliance: good compliance Aspirin: no Recurrent headaches: no Visual changes: no Palpitations: no Dyspnea: no Chest pain: no Lower extremity edema: no Dizzy/lightheaded: no  Relevant past medical, surgical, family and social history reviewed and updated as indicated. Interim medical history since our last visit reviewed. Allergies and medications reviewed and updated.  Review of Systems  Constitutional: Negative for activity change, diaphoresis, fatigue and fever.  Respiratory: Negative for cough, chest tightness, shortness of breath and wheezing.   Cardiovascular: Negative for chest pain, palpitations and leg swelling.  Neurological: Negative for dizziness,  syncope, weakness, light-headedness, numbness and headaches.  Psychiatric/Behavioral: Positive for sleep disturbance. Negative for decreased concentration, self-injury and suicidal ideas. The patient is nervous/anxious.     Per HPI unless specifically indicated above     Objective:    BP (!) 132/102   Temp 98.2 F (36.8 C)   Wt Readings from Last 3  Encounters:  01/03/19 263 lb (119.3 kg)  10/29/18 260 lb (117.9 kg)  09/21/17 256 lb 12.8 oz (116.5 kg)    Physical Exam Vitals and nursing note reviewed.  Constitutional:      General: He is awake. He is not in acute distress.    Appearance: He is well-developed. He is not ill-appearing.  HENT:     Head: Normocephalic.     Right Ear: Hearing normal. No drainage.     Left Ear: Hearing normal. No drainage.  Eyes:     General: Lids are normal.        Right eye: No discharge.        Left eye: No discharge.     Conjunctiva/sclera: Conjunctivae normal.  Pulmonary:     Effort: Pulmonary effort is normal. No accessory muscle usage or respiratory distress.  Musculoskeletal:     Cervical back: Normal range of motion.  Neurological:     Mental Status: He is alert and oriented to person, place, and time.  Psychiatric:        Mood and Affect: Mood normal.        Behavior: Behavior normal. Behavior is cooperative.        Thought Content: Thought content normal.        Judgment: Judgment normal.     Results for orders placed or performed in visit on [id number]  Basic Metabolic Panel (BMET)  Result Value Ref Range   Glucose 91 65 - 99 mg/dL   BUN 12 6 - 24 mg/dL   Creatinine, Ser 0.86 0.76 - 1.27 mg/dL   GFR calc non Af Amer 106 >59 mL/min/1.73   GFR calc Af Amer 123 >59 mL/min/1.73   BUN/Creatinine Ratio 14 9 - 20   Sodium 142 134 - 144 mmol/L   Potassium 4.9 3.5 - 5.2 mmol/L   Chloride 104 96 - 106 mmol/L   CO2 23 20 - 29 mmol/L   Calcium 9.0 8.7 - 10.2 mg/dL      Assessment & Plan:   Problem List Items Addressed This Visit      Cardiovascular and Mediastinum   Essential hypertension, benign    Chronic, slightly above goal on home readings.  Increase Lisinopril to 20 MG and adjust as needed.  Continue to check BP at home regularly and notify provider if elevations.  BMP next visit to assess.  Return in 4 weeks.      Relevant Medications   lisinopril (ZESTRIL) 20 MG tablet      Other   Depression with anxiety    Chronic, ongoing.  Continues to have occasional irritability.  Joint decision made and will increase Sertraline to 125 MG, script sent.  Denies SI/HI.  Return in 4 weeks for follow-up and adjust dose as needed.      Relevant Medications   sertraline (ZOLOFT) 25 MG tablet   sertraline (ZOLOFT) 100 MG tablet      I discussed the assessment and treatment plan with the patient. The patient was provided an opportunity to ask questions and all were answered. The patient agreed with the plan and demonstrated an understanding of the instructions.  The patient was advised to call back or seek an in-person evaluation if the symptoms worsen or if the condition fails to improve as anticipated.   I provided 15 minutes of time during this encounter.  Follow up plan: Return in about 4 weeks (around 03/29/2019) for Mood and HTN in office with labs.

## 2019-03-01 NOTE — Assessment & Plan Note (Signed)
Chronic, ongoing.  Continues to have occasional irritability.  Joint decision made and will increase Sertraline to 125 MG, script sent.  Denies SI/HI.  Return in 4 weeks for follow-up and adjust dose as needed.

## 2019-03-01 NOTE — Patient Instructions (Signed)
Living With Depression Everyone experiences occasional disappointment, sadness, and loss in their lives. When you are feeling down, blue, or sad for at least 2 weeks in a row, it may mean that you have depression. Depression can affect your thoughts and feelings, relationships, daily activities, and physical health. It is caused by changes in the way your brain functions. If you receive a diagnosis of depression, your health care provider will tell you which type of depression you have and what treatment options are available to you. If you are living with depression, there are ways to help you recover from it and also ways to prevent it from coming back. How to cope with lifestyle changes Coping with stress     Stress is your body's reaction to life changes and events, both good and bad. Stressful situations may include:  Getting married.  The death of a spouse.  Losing a job.  Retiring.  Having a baby. Stress can last just a few hours or it can be ongoing. Stress can play a major role in depression, so it is important to learn both how to cope with stress and how to think about it differently. Talk with your health care provider or a counselor if you would like to learn more about stress reduction. He or she may suggest some stress reduction techniques, such as:  Music therapy. This can include creating music or listening to music. Choose music that you enjoy and that inspires you.  Mindfulness-based meditation. This kind of meditation can be done while sitting or walking. It involves being aware of your normal breaths, rather than trying to control your breathing.  Centering prayer. This is a kind of meditation that involves focusing on a spiritual word or phrase. Choose a word, phrase, or sacred image that is meaningful to you and that brings you peace.  Deep breathing. To do this, expand your stomach and inhale slowly through your nose. Hold your breath for 3-5 seconds, then exhale  slowly, allowing your stomach muscles to relax.  Muscle relaxation. This involves intentionally tensing muscles then relaxing them. Choose a stress reduction technique that fits your lifestyle and personality. Stress reduction techniques take time and practice to develop. Set aside 5-15 minutes a day to do them. Therapists can offer training in these techniques. The training may be covered by some insurance plans. Other things you can do to manage stress include:  Keeping a stress diary. This can help you learn what triggers your stress and ways to control your response.  Understanding what your limits are and saying no to requests or events that lead to a schedule that is too full.  Thinking about how you respond to certain situations. You may not be able to control everything, but you can control how you react.  Adding humor to your life by watching funny films or TV shows.  Making time for activities that help you relax and not feeling guilty about spending your time this way.  Medicines Your health care provider may suggest certain medicines if he or she feels that they will help improve your condition. Avoid using alcohol and other substances that may prevent your medicines from working properly (may interact). It is also important to:  Talk with your pharmacist or health care provider about all the medicines that you take, their possible side effects, and what medicines are safe to take together.  Make it your goal to take part in all treatment decisions (shared decision-making). This includes giving input on   the side effects of medicines. It is best if shared decision-making with your health care provider is part of your total treatment plan. If your health care provider prescribes a medicine, you may not notice the full benefits of it for 4-8 weeks. Most people who are treated for depression need to be on medicine for at least 6-12 months after they feel better. If you are taking  medicines as part of your treatment, do not stop taking medicines without first talking to your health care provider. You may need to have the medicine slowly decreased (tapered) over time to decrease the risk of harmful side effects. Relationships Your health care provider may suggest family therapy along with individual therapy and drug therapy. While there may not be family problems that are causing you to feel depressed, it is still important to make sure your family learns as much as they can about your mental health. Having your family's support can help make your treatment successful. How to recognize changes in your condition Everyone has a different response to treatment for depression. Recovery from major depression happens when you have not had signs of major depression for two months. This may mean that you will start to:  Have more interest in doing activities.  Feel less hopeless than you did 2 months ago.  Have more energy.  Overeat less often, or have better or improving appetite.  Have better concentration. Your health care provider will work with you to decide the next steps in your recovery. It is also important to recognize when your condition is getting worse. Watch for these signs:  Having fatigue or low energy.  Eating too much or too little.  Sleeping too much or too little.  Feeling restless, agitated, or hopeless.  Having trouble concentrating or making decisions.  Having unexplained physical complaints.  Feeling irritable, angry, or aggressive. Get help as soon as you or your family members notice these symptoms coming back. How to get support and help from others How to talk with friends and family members about your condition  Talking to friends and family members about your condition can provide you with one way to get support and guidance. Reach out to trusted friends or family members, explain your symptoms to them, and let them know that you are  working with a health care provider to treat your depression. Financial resources Not all insurance plans cover mental health care, so it is important to check with your insurance carrier. If paying for co-pays or counseling services is a problem, search for a local or county mental health care center. They may be able to offer public mental health care services at low or no cost when you are not able to see a private health care provider. If you are taking medicine for depression, you may be able to get the generic form, which may be less expensive. Some makers of prescription medicines also offer help to patients who cannot afford the medicines they need. Follow these instructions at home:   Get the right amount and quality of sleep.  Cut down on using caffeine, tobacco, alcohol, and other potentially harmful substances.  Try to exercise, such as walking or lifting small weights.  Take over-the-counter and prescription medicines only as told by your health care provider.  Eat a healthy diet that includes plenty of vegetables, fruits, whole grains, low-fat dairy products, and lean protein. Do not eat a lot of foods that are high in solid fats, added sugars, or salt.    Keep all follow-up visits as told by your health care provider. This is important. Contact a health care provider if:  You stop taking your antidepressant medicines, and you have any of these symptoms: ? Nausea. ? Headache. ? Feeling lightheaded. ? Chills and body aches. ? Not being able to sleep (insomnia).  You or your friends and family think your depression is getting worse. Get help right away if:  You have thoughts of hurting yourself or others. If you ever feel like you may hurt yourself or others, or have thoughts about taking your own life, get help right away. You can go to your nearest emergency department or call:  Your local emergency services (911 in the [place]  A suicide crisis helpline, such as the  National Suicide Prevention Lifeline at 1-800-273-8255. This is open 24-hours a day. Summary  If you are living with depression, there are ways to help you recover from it and also ways to prevent it from coming back.  Work with your health care team to create a management plan that includes counseling, stress management techniques, and healthy lifestyle habits. This information is not intended to replace advice given to you by your health care provider. Make sure you discuss any questions you have with your health care provider. Document Revised: 05/28/2018 Document Reviewed: 01/07/2016 Elsevier Patient Education  2020 [employer]  

## 2019-03-30 ENCOUNTER — Encounter: Payer: Self-pay | Admitting: Nurse Practitioner

## 2019-03-30 ENCOUNTER — Ambulatory Visit (INDEPENDENT_AMBULATORY_CARE_PROVIDER_SITE_OTHER): Payer: Managed Care, Other (non HMO) | Admitting: Nurse Practitioner

## 2019-03-30 ENCOUNTER — Other Ambulatory Visit: Payer: Self-pay

## 2019-03-30 VITALS — BP 134/85 | HR 71 | Temp 98.3°F

## 2019-03-30 DIAGNOSIS — F418 Other specified anxiety disorders: Secondary | ICD-10-CM

## 2019-03-30 DIAGNOSIS — G4733 Obstructive sleep apnea (adult) (pediatric): Secondary | ICD-10-CM | POA: Diagnosis not present

## 2019-03-30 DIAGNOSIS — E559 Vitamin D deficiency, unspecified: Secondary | ICD-10-CM

## 2019-03-30 DIAGNOSIS — I10 Essential (primary) hypertension: Secondary | ICD-10-CM | POA: Diagnosis not present

## 2019-03-30 MED ORDER — LISINOPRIL 40 MG PO TABS: 40.0000 mg | ORAL_TABLET | Freq: Every day | ORAL | 3 refills | Status: DC

## 2019-03-30 MED ORDER — SERTRALINE HCL 100 MG PO TABS: 150.0000 mg | ORAL_TABLET | Freq: Every day | ORAL | 3 refills | Status: DC

## 2019-03-30 NOTE — Progress Notes (Signed)
BP 134/85 (BP Location: Left Arm, Patient Position: Sitting, Cuff Size: Normal)   Pulse 71   Temp 98.3 F (36.8 C) (Oral)   SpO2 97%    Subjective:    Patient ID: Joe Gutierrez, male    DOB: 13-Dec-1975, 44 y.o.   MRN: [id number]  HPI: Joe Gutierrez is a 44 y.o. male  Chief Complaint  Patient presents with  . Depression  . Anxiety  . Hypertension   HYPERTENSION Increased Lisinopril at last visit to 20 MG daily.  Does notice when gets hot gets a tickle in the throat and coughs to clear, but not a chronic cough.  Reports his mom has similar thing happen when she gets hot.  Vitamin D was low on recent labs, 28.2, has not started supplement.  Did go for sleep study and is working on obtaining CPAP, high cost. Hypertension status: stable  Satisfied with current treatment? yes Duration of hypertension: chronic BP monitoring frequency:  daily BP range: 135-140/85's range at home BP medication side effects:  no Medication compliance: good compliance Aspirin: no Recurrent headaches: no Visual changes: no Palpitations: no Dyspnea: no Chest pain: no Lower extremity edema: no Dizzy/lightheaded: no   DEPRESSION Increased Sertraline last visit to 125 MG daily.  Is having more better days then bad with increase, 80% good days and 20% bad.  Had a couple days where was more tearful and unmotivated.   Mood status: stable Satisfied with current treatment?: yes Symptom severity: moderate  Duration of current treatment : chronic Side effects: no Medication compliance: good compliance Psychotherapy/counseling: none Depressed mood: rarely Anxious mood: sometimes Anhedonia: no Significant weight loss or gain: no Insomnia: occasional Fatigue: sometimes Feelings of worthlessness or guilt: a little bit Impaired concentration/indecisiveness: sometimes Suicidal ideations: no Hopelessness: no Crying spells: a couple days Depression screen [facility] 2/9 03/30/2019 01/31/2019 01/03/2019  10/29/2018 09/21/2017  Decreased Interest 1 0 1 1 1   Down, Depressed, Hopeless 1 1 1 2 1   PHQ - 2 Score 2 1 2 3 2   Altered sleeping 1 1 3 2 2   Tired, decreased energy 1 1 3 2 2   Change in appetite 1 1 1 2 1   Feeling bad or failure about yourself  1 1 2 1 1   Trouble concentrating 1 1 1 1 1   Moving slowly or fidgety/restless 1 0 0 1 1  Suicidal thoughts 0 0 1 1 1   PHQ-9 Score 8 6 13 13 11   Difficult doing work/chores Not difficult at all Not difficult at all Somewhat difficult Somewhat difficult -  Some recent data might be hidden   GAD 7 : Generalized Anxiety Score 03/30/2019 03/01/2019 01/31/2019 10/29/2018  Nervous, Anxious, on Edge 1 1 1 2   Control/stop worrying 1 1 0 1  Worry too much - different things 1 1 1 1   Trouble relaxing 1 1 1 1   Restless 1 1 1 2   Easily annoyed or irritable 1 2 1 2   Afraid - awful might happen 0 0 0 1  Total GAD 7 Score 6 7 5 10   Anxiety Difficulty Not difficult at all Somewhat difficult Not difficult at all Somewhat difficult     Relevant past medical, surgical, family and social history reviewed and updated as indicated. Interim medical history since our last visit reviewed. Allergies and medications reviewed and updated.  Review of Systems  Constitutional: Negative for activity change, diaphoresis, fatigue and fever.  Respiratory: Negative for cough, chest tightness, shortness of breath and wheezing.  Cardiovascular: Negative for chest pain, palpitations and leg swelling.  Gastrointestinal: Negative.   Endocrine: Negative.   Psychiatric/Behavioral: Positive for decreased concentration and sleep disturbance. Negative for self-injury and suicidal ideas. The patient is nervous/anxious.     Per HPI unless specifically indicated above     Objective:    BP 134/85 (BP Location: Left Arm, Patient Position: Sitting, Cuff Size: Normal)   Pulse 71   Temp 98.3 F (36.8 C) (Oral)   SpO2 97%   Wt Readings from Last 3 Encounters:  01/03/19 263 lb (119.3  kg)  10/29/18 260 lb (117.9 kg)  09/21/17 256 lb 12.8 oz (116.5 kg)    Physical Exam Vitals and nursing note reviewed.  Constitutional:      General: He is awake. He is not in acute distress.    Appearance: He is well-developed, well-groomed and overweight. He is not ill-appearing.  HENT:     Head: Normocephalic and atraumatic.     Right Ear: Hearing normal. No drainage.     Left Ear: Hearing normal. No drainage.  Eyes:     General: Lids are normal.        Right eye: No discharge.        Left eye: No discharge.     Conjunctiva/sclera: Conjunctivae normal.     Pupils: Pupils are equal, round, and reactive to light.  Neck:     Vascular: No carotid bruit.  Cardiovascular:     Rate and Rhythm: Normal rate and regular rhythm.     Heart sounds: Normal heart sounds, S1 normal and S2 normal. No murmur. No gallop.   Pulmonary:     Effort: Pulmonary effort is normal. No accessory muscle usage or respiratory distress.     Breath sounds: Normal breath sounds.  Abdominal:     General: Bowel sounds are normal.     Palpations: Abdomen is soft.  Musculoskeletal:        General: Normal range of motion.     Cervical back: Normal range of motion and neck supple.     Right lower leg: No edema.     Left lower leg: No edema.  Skin:    General: Skin is warm and dry.  Neurological:     Mental Status: He is alert and oriented to person, place, and time.  Psychiatric:        Attention and Perception: Attention normal.        Mood and Affect: Mood normal.        Speech: Speech normal.        Behavior: Behavior normal. Behavior is cooperative.     Results for orders placed or performed in visit on [id number]  Basic Metabolic Panel (BMET)  Result Value Ref Range   Glucose 91 65 - 99 mg/dL   BUN 12 6 - 24 mg/dL   Creatinine, Ser 0.86 0.76 - 1.27 mg/dL   GFR calc non Af Amer 106 >59 mL/min/1.73   GFR calc Af Amer 123 >59 mL/min/1.73   BUN/Creatinine Ratio 14 9 - 20   Sodium 142 134 - 144  mmol/L   Potassium 4.9 3.5 - 5.2 mmol/L   Chloride 104 96 - 106 mmol/L   CO2 23 20 - 29 mmol/L   Calcium 9.0 8.7 - 10.2 mg/dL      Assessment & Plan:   Problem List Items Addressed This Visit      Cardiovascular and Mediastinum   Essential hypertension, benign - Primary    Chronic, ongoing.  Close to goal in office today, <130/80, at home still some elevations on occasion.  Will increase Lisinopril to 40 MG and reassess in 4 weeks.  Recommend he continue to monitor BP at home and document.  To notify provider if any increase and consistency in cough.  Script sent in.  BMP today.  Return in 4 weeks.      Relevant Medications   lisinopril (ZESTRIL) 40 MG tablet   Other Relevant Orders   Basic Metabolic Panel (BMET)     Respiratory   Sleep apnea    Working on obtaining CPAP, is higher cost.        Other   Depression with anxiety    Chronic, ongoing.  Continues to have occasional irritability.  Joint decision made and will increase Sertraline to 150 MG, script sent.  Denies SI/HI.  Return in 4 weeks for follow-up and adjust dose as needed.      Relevant Medications   sertraline (ZOLOFT) 100 MG tablet   Vitamin D deficiency    Recheck Vit D today, recommend starting supplement 1000 units daily.      Relevant Orders   VITAMIN D 25 Hydroxy (Vit-D Deficiency, Fractures)       Follow up plan: Return in about 4 weeks (around 04/27/2019) for HTN and Mood.

## 2019-03-30 NOTE — Assessment & Plan Note (Signed)
Recheck Vit D today, recommend starting supplement 1000 units daily.

## 2019-03-30 NOTE — Patient Instructions (Signed)
Take Vitamin D 1000 units daily   Vitamin D Deficiency Vitamin D deficiency is when your body does not have enough vitamin D. Vitamin D is important to your body because:  It helps your body use other minerals.  It helps to keep your bones strong and healthy.  It may help to prevent some diseases.  It helps your heart and other muscles work well. Not getting enough vitamin D can make your bones soft. It can also cause other health problems. What are the causes? This condition may be caused by:  Not eating enough foods that contain vitamin D.  Not getting enough sun.  Having diseases that make it hard for your body to absorb vitamin D.  Having a surgery in which a part of the stomach or a part of the small intestine is removed.  Having kidney disease or liver disease. What increases the risk? You are more likely to get this condition if:  You are older.  You do not spend much time outdoors.  You live in a nursing home.  You have had broken bones.  You have weak or thin bones (osteoporosis).  You have a disease or condition that changes how your body absorbs vitamin D.  You have dark skin.  You take certain medicines.  You are overweight or obese. What are the signs or symptoms?  In mild cases, there may not be any symptoms. If the condition is very bad, symptoms may include: ? Bone pain. ? Muscle pain. ? Falling often. ? Broken bones caused by a minor injury. How is this treated? Treatment may include taking supplements as told by your doctor. Your doctor will tell you what dose is best for you. Supplements may include:  Vitamin D.  Calcium. Follow these instructions at home: Eating and drinking   Eat foods that contain vitamin D, such as: ? Dairy products, cereals, or juices with added vitamin D. Check the label. ? Fish, such as salmon or trout. ? Eggs. ? Oysters. ? Mushrooms. The items listed above may not be a complete list of what you can eat  and drink. Contact a dietitian for more options. General instructions  Take medicines and supplements only as told by your doctor.  Get regular, safe exposure to natural sunlight.  Do not use a tanning bed.  Maintain a healthy weight. Lose weight if needed.  Keep all follow-up visits as told by your doctor. This is important. How is this prevented?  You can get vitamin D by: ? Eating foods that naturally contain vitamin D. ? Eating or drinking products that have vitamin D added to them, such as cereals, juices, and milk. ? Taking vitamin D or a multivitamin that contains vitamin D. ? Being in the sun. Your body makes vitamin D when your skin is exposed to sunlight. Your body changes the sunlight into a form of the vitamin that it can use. Contact a doctor if:  Your symptoms do not go away.  You feel sick to your stomach (nauseous).  You throw up (vomit).  You poop less often than normal, or you have trouble pooping (constipation). Summary  Vitamin D deficiency is when your body does not have enough vitamin D.  Vitamin D helps to keep your bones strong and healthy.  This condition is often treated by taking a supplement.  Your doctor will tell you what dose is best for you. This information is not intended to replace advice given to you by your health care  provider. Make sure you discuss any questions you have with your health care provider. Document Revised: 10/12/2017 Document Reviewed: 10/12/2017 Elsevier Patient Education  [place].

## 2019-03-30 NOTE — Assessment & Plan Note (Signed)
Chronic, ongoing.  Close to goal in office today, <130/80, at home still some elevations on occasion.  Will increase Lisinopril to 40 MG and reassess in 4 weeks.  Recommend he continue to monitor BP at home and document.  To notify provider if any increase and consistency in cough.  Script sent in.  BMP today.  Return in 4 weeks.

## 2019-03-30 NOTE — Assessment & Plan Note (Signed)
Chronic, ongoing.  Continues to have occasional irritability.  Joint decision made and will increase Sertraline to 150 MG, script sent.  Denies SI/HI.  Return in 4 weeks for follow-up and adjust dose as needed.

## 2019-03-30 NOTE — Assessment & Plan Note (Signed)
Working on obtaining CPAP, is higher cost.

## 2019-03-31 LAB — BASIC METABOLIC PANEL
BUN/Creatinine Ratio: 18 (ref 9–20)
BUN: 17 mg/dL (ref 6–24)
CO2: 20 mmol/L (ref 20–29)
Calcium: 9.1 mg/dL (ref 8.7–10.2)
Chloride: 103 mmol/L (ref 96–106)
Creatinine, Ser: 0.96 mg/dL (ref 0.76–1.27)
GFR calc Af Amer: 111 mL/min/{1.73_m2} (ref >59)
GFR calc non Af Amer: 96 mL/min/{1.73_m2} (ref >59)
Glucose: 99 mg/dL (ref 65–99)
Potassium: 4.1 mmol/L (ref 3.5–5.2)
Sodium: 141 mmol/L (ref 134–144)

## 2019-03-31 LAB — VITAMIN D 25 HYDROXY (VIT D DEFICIENCY, FRACTURES): Vit D, 25-Hydroxy: 22.1 ng/mL — ABNORMAL LOW (ref 30.0–100.0)

## 2019-03-31 NOTE — Progress Notes (Signed)
Good morning.  Please let Joe Gutierrez know his kidney function continues to look great.  Vitamin D remains low, I do highly recommend he start taking Vitamin D3 1000 units daily.  This is very helpful for overall health and great for bone health.  I will recheck this once he starts consistently taking supplement.  It can be found in vitamin section at many locations and can get store brand, which is just as good.  If any questions let me know.  Have a great day!!

## 2019-05-02 ENCOUNTER — Telehealth (INDEPENDENT_AMBULATORY_CARE_PROVIDER_SITE_OTHER): Payer: 59 | Admitting: Nurse Practitioner

## 2019-05-02 ENCOUNTER — Encounter: Payer: Self-pay | Admitting: Nurse Practitioner

## 2019-05-02 ENCOUNTER — Ambulatory Visit: Payer: Managed Care, Other (non HMO) | Admitting: Nurse Practitioner

## 2019-05-02 VITALS — BP 132/90 | HR 55

## 2019-05-02 DIAGNOSIS — G4733 Obstructive sleep apnea (adult) (pediatric): Secondary | ICD-10-CM | POA: Diagnosis not present

## 2019-05-02 DIAGNOSIS — F418 Other specified anxiety disorders: Secondary | ICD-10-CM

## 2019-05-02 DIAGNOSIS — I10 Essential (primary) hypertension: Secondary | ICD-10-CM

## 2019-05-02 DIAGNOSIS — E559 Vitamin D deficiency, unspecified: Secondary | ICD-10-CM

## 2019-05-02 NOTE — Assessment & Plan Note (Signed)
Working on obtaining CPAP, is higher cost.

## 2019-05-02 NOTE — Assessment & Plan Note (Signed)
Recheck Vit D outpatient, if continues to be low start higher dose weekly Vit D for 6-8 weeks.

## 2019-05-02 NOTE — Patient Instructions (Signed)
Living With Depression Everyone experiences occasional disappointment, sadness, and loss in their lives. When you are feeling down, blue, or sad for at least 2 weeks in a row, it may mean that you have depression. Depression can affect your thoughts and feelings, relationships, daily activities, and physical health. It is caused by changes in the way your brain functions. If you receive a diagnosis of depression, your health care provider will tell you which type of depression you have and what treatment options are available to you. If you are living with depression, there are ways to help you recover from it and also ways to prevent it from coming back. How to cope with lifestyle changes Coping with stress     Stress is your body's reaction to life changes and events, both good and bad. Stressful situations may include:  Getting married.  The death of a spouse.  Losing a job.  Retiring.  Having a baby. Stress can last just a few hours or it can be ongoing. Stress can play a major role in depression, so it is important to learn both how to cope with stress and how to think about it differently. Talk with your health care provider or a counselor if you would like to learn more about stress reduction. He or she may suggest some stress reduction techniques, such as:  Music therapy. This can include creating music or listening to music. Choose music that you enjoy and that inspires you.  Mindfulness-based meditation. This kind of meditation can be done while sitting or walking. It involves being aware of your normal breaths, rather than trying to control your breathing.  Centering prayer. This is a kind of meditation that involves focusing on a spiritual word or phrase. Choose a word, phrase, or sacred image that is meaningful to you and that brings you peace.  Deep breathing. To do this, expand your stomach and inhale slowly through your nose. Hold your breath for 3-5 seconds, then exhale  slowly, allowing your stomach muscles to relax.  Muscle relaxation. This involves intentionally tensing muscles then relaxing them. Choose a stress reduction technique that fits your lifestyle and personality. Stress reduction techniques take time and practice to develop. Set aside 5-15 minutes a day to do them. Therapists can offer training in these techniques. The training may be covered by some insurance plans. Other things you can do to manage stress include:  Keeping a stress diary. This can help you learn what triggers your stress and ways to control your response.  Understanding what your limits are and saying no to requests or events that lead to a schedule that is too full.  Thinking about how you respond to certain situations. You may not be able to control everything, but you can control how you react.  Adding humor to your life by watching funny films or TV shows.  Making time for activities that help you relax and not feeling guilty about spending your time this way.  Medicines Your health care provider may suggest certain medicines if he or she feels that they will help improve your condition. Avoid using alcohol and other substances that may prevent your medicines from working properly (may interact). It is also important to:  Talk with your pharmacist or health care provider about all the medicines that you take, their possible side effects, and what medicines are safe to take together.  Make it your goal to take part in all treatment decisions (shared decision-making). This includes giving input on   the side effects of medicines. It is best if shared decision-making with your health care provider is part of your total treatment plan. If your health care provider prescribes a medicine, you may not notice the full benefits of it for 4-8 weeks. Most people who are treated for depression need to be on medicine for at least 6-12 months after they feel better. If you are taking  medicines as part of your treatment, do not stop taking medicines without first talking to your health care provider. You may need to have the medicine slowly decreased (tapered) over time to decrease the risk of harmful side effects. Relationships Your health care provider may suggest family therapy along with individual therapy and drug therapy. While there may not be family problems that are causing you to feel depressed, it is still important to make sure your family learns as much as they can about your mental health. Having your family's support can help make your treatment successful. How to recognize changes in your condition Everyone has a different response to treatment for depression. Recovery from major depression happens when you have not had signs of major depression for two months. This may mean that you will start to:  Have more interest in doing activities.  Feel less hopeless than you did 2 months ago.  Have more energy.  Overeat less often, or have better or improving appetite.  Have better concentration. Your health care provider will work with you to decide the next steps in your recovery. It is also important to recognize when your condition is getting worse. Watch for these signs:  Having fatigue or low energy.  Eating too much or too little.  Sleeping too much or too little.  Feeling restless, agitated, or hopeless.  Having trouble concentrating or making decisions.  Having unexplained physical complaints.  Feeling irritable, angry, or aggressive. Get help as soon as you or your family members notice these symptoms coming back. How to get support and help from others How to talk with friends and family members about your condition  Talking to friends and family members about your condition can provide you with one way to get support and guidance. Reach out to trusted friends or family members, explain your symptoms to them, and let them know that you are  working with a health care provider to treat your depression. Financial resources Not all insurance plans cover mental health care, so it is important to check with your insurance carrier. If paying for co-pays or counseling services is a problem, search for a local or county mental health care center. They may be able to offer public mental health care services at low or no cost when you are not able to see a private health care provider. If you are taking medicine for depression, you may be able to get the generic form, which may be less expensive. Some makers of prescription medicines also offer help to patients who cannot afford the medicines they need. Follow these instructions at home:   Get the right amount and quality of sleep.  Cut down on using caffeine, tobacco, alcohol, and other potentially harmful substances.  Try to exercise, such as walking or lifting small weights.  Take over-the-counter and prescription medicines only as told by your health care provider.  Eat a healthy diet that includes plenty of vegetables, fruits, whole grains, low-fat dairy products, and lean protein. Do not eat a lot of foods that are high in solid fats, added sugars, or salt.    Keep all follow-up visits as told by your health care provider. This is important. Contact a health care provider if:  You stop taking your antidepressant medicines, and you have any of these symptoms: ? Nausea. ? Headache. ? Feeling lightheaded. ? Chills and body aches. ? Not being able to sleep (insomnia).  You or your friends and family think your depression is getting worse. Get help right away if:  You have thoughts of hurting yourself or others. If you ever feel like you may hurt yourself or others, or have thoughts about taking your own life, get help right away. You can go to your nearest emergency department or call:  Your local emergency services (911 in the [place]  A suicide crisis helpline, such as the  National Suicide Prevention Lifeline at 1-800-273-8255. This is open 24-hours a day. Summary  If you are living with depression, there are ways to help you recover from it and also ways to prevent it from coming back.  Work with your health care team to create a management plan that includes counseling, stress management techniques, and healthy lifestyle habits. This information is not intended to replace advice given to you by your health care provider. Make sure you discuss any questions you have with your health care provider. Document Revised: 05/28/2018 Document Reviewed: 01/07/2016 Elsevier Patient Education  2020 [employer]  

## 2019-05-02 NOTE — Assessment & Plan Note (Signed)
Chronic, ongoing.  Improved mood with increase in medication.  Continue Sertraline 150 MG and adjust as needed.  Denies SI/HI.  Return in 6 months.

## 2019-05-02 NOTE — Progress Notes (Signed)
BP 132/90   Pulse (!) 55    Subjective:    Patient ID: Joe Gutierrez, male    DOB: 09-27-1975, 44 y.o.   MRN: [id number]  HPI: Joe Gutierrez is a 44 y.o. male  Chief Complaint  Patient presents with  . Depression  . Hypertension    . This visit was completed via telephone due to the restrictions of the COVID-19 pandemic. All issues as above were discussed and addressed but no physical exam was performed. If it was felt that the patient should be evaluated in the office, they were directed there. The patient verbally consented to this visit. Patient was unable to complete an audio/visual visit due to Technical difficulties,Lack of internet. Due to the catastrophic nature of the COVID-19 pandemic, this visit was done through audio contact only. . Location of the patient: home . Location of the provider: work . Those involved with this call:  . Provider: Marnee Guarneri, DNP . CMA: Yvonna Alanis, CMA . Front Desk/Registration: Don Perking  . Time spent on call: 15 minutes on the phone discussing health concerns. 10 minutes total spent in review of patient's record and preparation of their chart.  . I verified patient identity using two factors (patient name and date of birth). Patient consents verbally to being seen via telemedicine visit today.    HYPERTENSION Increased Lisinopril at last visit to 40 MG daily.  Did go for sleep study and is working on obtaining CPAP, high cost, he hopes to get it soon. Is taking Vitamin D 1000 units daily, last 22.1.   Hypertension status: stable  Satisfied with current treatment? yes Duration of hypertension: chronic BP monitoring frequency:  daily BP range: 132 -140/85's range at home BP medication side effects:  no Medication compliance: good compliance Aspirin: no Recurrent headaches: no Visual changes: no Palpitations: no Dyspnea: no Chest pain: no Lower extremity edema: no Dizzy/lightheaded: no    DEPRESSION Increased Sertraline last visit to 150 MG daily.  Is having more better days then bad with increase, 80% good days and 20% bad.  He feels current dose is a good spot for him.  Mood status: stable Satisfied with current treatment?: yes Symptom severity: moderate  Duration of current treatment : chronic Side effects: no Medication compliance: good compliance Psychotherapy/counseling: none Depressed mood: rarely Anxious mood: sometimes Anhedonia: no Significant weight loss or gain: no Insomnia: occasional Fatigue: sometimes Feelings of worthlessness or guilt: none Impaired concentration/indecisiveness: none Suicidal ideations: no Hopelessness: no Crying spells: weeks ago Depression screen [facility] 2/9 05/02/2019 03/30/2019 01/31/2019 01/03/2019 10/29/2018  Decreased Interest 0 1 0 1 1  Down, Depressed, Hopeless 1 1 1 1 2   PHQ - 2 Score 1 2 1 2 3   Altered sleeping 1 1 1 3 2   Tired, decreased energy 0 1 1 3 2   Change in appetite 0 1 1 1 2   Feeling bad or failure about yourself  0 1 1 2 1   Trouble concentrating 1 1 1 1 1   Moving slowly or fidgety/restless 0 1 0 0 1  Suicidal thoughts 0 0 0 1 1  PHQ-9 Score 3 8 6 13 13   Difficult doing work/chores Not difficult at all Not difficult at all Not difficult at all Somewhat difficult Somewhat difficult  Some recent data might be hidden   GAD 7 : Generalized Anxiety Score 05/02/2019 03/30/2019 03/01/2019 01/31/2019  Nervous, Anxious, on Edge 0 1 1 1   Control/stop worrying 1 1 1  0  Worry too much -  different things 0 1 1 1   Trouble relaxing 1 1 1 1   Restless 1 1 1 1   Easily annoyed or irritable 1 1 2 1   Afraid - awful might happen 0 0 0 0  Total GAD 7 Score 4 6 7 5   Anxiety Difficulty Not difficult at all Not difficult at all Somewhat difficult Not difficult at all    Relevant past medical, surgical, family and social history reviewed and updated as indicated. Interim medical history since our last visit reviewed. Allergies and  medications reviewed and updated.  Review of Systems  Constitutional: Negative for activity change, diaphoresis, fatigue and fever.  Respiratory: Negative for cough, chest tightness, shortness of breath and wheezing.   Cardiovascular: Negative for chest pain, palpitations and leg swelling.  Gastrointestinal: Negative.   Endocrine: Negative.   Psychiatric/Behavioral: Positive for sleep disturbance. Negative for decreased concentration, self-injury and suicidal ideas. The patient is not nervous/anxious.     Per HPI unless specifically indicated above     Objective:    BP 132/90   Pulse (!) 55   Wt Readings from Last 3 Encounters:  01/03/19 263 lb (119.3 kg)  10/29/18 260 lb (117.9 kg)  09/21/17 256 lb 12.8 oz (116.5 kg)    Physical Exam  Unable to perform, technical issues and telephone visit only.  Results for orders placed or performed in visit on [id number]  Basic Metabolic Panel (BMET)  Result Value Ref Range   Glucose 99 65 - 99 mg/dL   BUN 17 6 - 24 mg/dL   Creatinine, Ser 0.96 0.76 - 1.27 mg/dL   GFR calc non Af Amer 96 >59 mL/min/1.73   GFR calc Af Amer 111 >59 mL/min/1.73   BUN/Creatinine Ratio 18 9 - 20   Sodium 141 134 - 144 mmol/L   Potassium 4.1 3.5 - 5.2 mmol/L   Chloride 103 96 - 106 mmol/L   CO2 20 20 - 29 mmol/L   Calcium 9.1 8.7 - 10.2 mg/dL  VITAMIN D 25 Hydroxy (Vit-D Deficiency, Fractures)  Result Value Ref Range   Vit D, 25-Hydroxy 22.1 (L) 30.0 - 100.0 ng/mL      Assessment & Plan:   Problem List Items Addressed This Visit      Cardiovascular and Mediastinum   Essential hypertension, benign - Primary    Chronic, ongoing.  Some improvement with increase in Lisinopril.  Continue Lisinopril 40 MG at this time and if continued elevation above goal in future consider addition of Amlodipine.  Recommend he continue to monitor BP at home and document.  To notify provider if any increase and consistency in cough.  Script sent BMP outpatient.  Return in 6  months.      Relevant Orders   Basic metabolic panel     Respiratory   Sleep apnea    Working on obtaining CPAP, is higher cost.        Other   Depression with anxiety    Chronic, ongoing.  Improved mood with increase in medication.  Continue Sertraline 150 MG and adjust as needed.  Denies SI/HI.  Return in 6 months.      Vitamin D deficiency    Recheck Vit D outpatient, if continues to be low start higher dose weekly Vit D for 6-8 weeks.      Relevant Orders   VITAMIN D 25 Hydroxy (Vit-D Deficiency, Fractures)      I discussed the assessment and treatment plan with the patient. The patient was provided an opportunity to  ask questions and all were answered. The patient agreed with the plan and demonstrated an understanding of the instructions.   The patient was advised to call back or seek an in-person evaluation if the symptoms worsen or if the condition fails to improve as anticipated.   I provided 15+ minutes of time during this encounter.  Follow up plan: Return in about 6 months (around 11/02/2019) for Annual physical.

## 2019-05-02 NOTE — Assessment & Plan Note (Signed)
Chronic, ongoing.  Some improvement with increase in Lisinopril.  Continue Lisinopril 40 MG at this time and if continued elevation above goal in future consider addition of Amlodipine.  Recommend he continue to monitor BP at home and document.  To notify provider if any increase and consistency in cough.  Script sent BMP outpatient.  Return in 6 months.

## 2019-05-04 ENCOUNTER — Other Ambulatory Visit: Payer: Self-pay

## 2019-05-04 ENCOUNTER — Other Ambulatory Visit: Payer: 59

## 2019-05-04 DIAGNOSIS — I10 Essential (primary) hypertension: Secondary | ICD-10-CM

## 2019-05-04 DIAGNOSIS — E559 Vitamin D deficiency, unspecified: Secondary | ICD-10-CM

## 2019-05-05 LAB — BASIC METABOLIC PANEL
BUN/Creatinine Ratio: 12 (ref 9–20)
BUN: 11 mg/dL (ref 6–24)
CO2: 23 mmol/L (ref 20–29)
Calcium: 9.3 mg/dL (ref 8.7–10.2)
Chloride: 104 mmol/L (ref 96–106)
Creatinine, Ser: 0.92 mg/dL (ref 0.76–1.27)
GFR calc Af Amer: 117 mL/min/{1.73_m2} (ref >59)
GFR calc non Af Amer: 102 mL/min/{1.73_m2} (ref >59)
Glucose: 86 mg/dL (ref 65–99)
Potassium: 4.9 mmol/L (ref 3.5–5.2)
Sodium: 142 mmol/L (ref 134–144)

## 2019-05-05 LAB — VITAMIN D 25 HYDROXY (VIT D DEFICIENCY, FRACTURES): Vit D, 25-Hydroxy: 31 ng/mL (ref 30.0–100.0)

## 2019-05-05 NOTE — Progress Notes (Signed)
Good afternoon, please let Joe Gutierrez know his Vitamin D level is improving.  Continue daily supplement and kidney function continues to look great!!  Wonderful job!!  Have a great weekend!!

## 2019-07-25 ENCOUNTER — Other Ambulatory Visit: Payer: Self-pay

## 2019-07-25 ENCOUNTER — Ambulatory Visit (INDEPENDENT_AMBULATORY_CARE_PROVIDER_SITE_OTHER): Payer: 59 | Admitting: Family Medicine

## 2019-07-25 ENCOUNTER — Encounter: Payer: Self-pay | Admitting: Family Medicine

## 2019-07-25 VITALS — BP 131/82 | HR 62 | Temp 98.0°F | Wt 254.0 lb

## 2019-07-25 DIAGNOSIS — L509 Urticaria, unspecified: Secondary | ICD-10-CM | POA: Diagnosis not present

## 2019-07-25 MED ORDER — PREDNISONE 10 MG PO TABS: ORAL_TABLET | ORAL | 0 refills | Status: DC

## 2019-07-25 MED ORDER — TRIAMCINOLONE ACETONIDE 0.1 % EX CREA: 1.0000 "application " | TOPICAL_CREAM | Freq: Two times a day (BID) | CUTANEOUS | 2 refills | Status: DC

## 2019-07-25 NOTE — Progress Notes (Signed)
BP 131/82   Pulse 62   Temp 98 F (36.7 C) (Oral)   Wt 254 lb (115.2 kg)   SpO2 96%   BMI 37.29 kg/m    Subjective:    Patient ID: Joe Gutierrez, male    DOB: [birth year], 44 y.o.   MRN: [id number]  HPI: Joe Gutierrez is a 44 y.o. male  Chief Complaint  Patient presents with  . Rash    bilateral legs and abdomen off and on for years, got worse since yesterday   Presenting today for significant itchy hives rash that has been present for years intermittently on abdomen and b/l legs. Seems to be triggered by heat/sweating typically. Tries OTC creams and allergy tablets with very minimal relief. Denies tongue swelling, wheezing, SOB, N/V.   Relevant past medical, surgical, family and social history reviewed and updated as indicated. Interim medical history since our last visit reviewed. Allergies and medications reviewed and updated.  Review of Systems  Per HPI unless specifically indicated above     Objective:    BP 131/82   Pulse 62   Temp 98 F (36.7 C) (Oral)   Wt 254 lb (115.2 kg)   SpO2 96%   BMI 37.29 kg/m   Wt Readings from Last 3 Encounters:  07/25/19 254 lb (115.2 kg)  01/03/19 263 lb (119.3 kg)  10/29/18 260 lb (117.9 kg)    Physical Exam Vitals and nursing note reviewed.  Constitutional:      Appearance: Normal appearance.  HENT:     Head: Atraumatic.  Eyes:     Extraocular Movements: Extraocular movements intact.     Conjunctiva/sclera: Conjunctivae normal.  Cardiovascular:     Rate and Rhythm: Normal rate and regular rhythm.  Pulmonary:     Effort: Pulmonary effort is normal.     Breath sounds: Normal breath sounds.  Musculoskeletal:        General: Normal range of motion.     Cervical back: Normal range of motion and neck supple.  Skin:    General: Skin is warm and dry.     Findings: Erythema and rash (dense urticarial rash present diffusely across lower abdomen and upper legs b/l. erythematous, not scabbed or draining) present.    Neurological:     General: No focal deficit present.     Mental Status: He is oriented to person, place, and time.  Psychiatric:        Mood and Affect: Mood normal.        Thought Content: Thought content normal.        Judgment: Judgment normal.     Results for orders placed or performed in visit on 05/04/19  VITAMIN D 25 Hydroxy (Vit-D Deficiency, Fractures)  Result Value Ref Range   Vit D, 25-Hydroxy 31.0 30.0 - 100.0 ng/mL  Basic metabolic panel  Result Value Ref Range   Glucose 86 65 - 99 mg/dL   BUN 11 6 - 24 mg/dL   Creatinine, Ser 0.92 0.76 - 1.27 mg/dL   GFR calc non Af Amer 102 >59 mL/min/1.73   GFR calc Af Amer 117 >59 mL/min/1.73   BUN/Creatinine Ratio 12 9 - 20   Sodium 142 134 - 144 mmol/L   Potassium 4.9 3.5 - 5.2 mmol/L   Chloride 104 96 - 106 mmol/L   CO2 23 20 - 29 mmol/L   Calcium 9.3 8.7 - 10.2 mg/dL      Assessment & Plan:   Problem List Items Addressed This Visit  None    Visit Diagnoses    Urticaria    -  Primary   Heat triggered. Discussed increasing antihistamines to BID, triamcinolone prn. Prednisone taper for current flare given severity. Declines Derm or Allergy ref.       Follow up plan: Return if symptoms worsen or fail to improve.

## 2019-07-27 ENCOUNTER — Other Ambulatory Visit: Payer: Self-pay | Admitting: Nurse Practitioner

## 2019-07-27 MED ORDER — SERTRALINE HCL 100 MG PO TABS: 150.0000 mg | ORAL_TABLET | Freq: Every day | ORAL | 0 refills | Status: DC

## 2019-07-27 MED ORDER — LISINOPRIL 40 MG PO TABS: 40.0000 mg | ORAL_TABLET | Freq: Every day | ORAL | 0 refills | Status: DC

## 2019-07-27 NOTE — Telephone Encounter (Signed)
Requested Prescriptions  Pending Prescriptions Disp Refills   sertraline (ZOLOFT) 100 MG tablet 7 tablet 0    Sig: Take 1.5 tablets (150 mg total) by mouth daily.     Psychiatry:  Antidepressants - SSRI Passed - 07/27/2019  8:20 AM      Passed - Completed PHQ-2 or PHQ-9 in the last 360 days.      Passed - Valid encounter within last 6 months    Recent Outpatient Visits          2 days ago    [facility], [place], [place]   2 months ago Essential hypertension, benign   [place], [place] T, NP   3 months ago Essential hypertension, benign   [place], [place] T, NP   4 months ago Essential hypertension, benign   [place], [place] T, NP   5 months ago Depression with anxiety   [place], [place] T, NP      Future Appointments            In 3 months Cannady, Barbaraann Faster, NP [facility]            lisinopril (ZESTRIL) 40 MG tablet 7 tablet 0    Sig: Take 1 tablet (40 mg total) by mouth daily.     Cardiovascular:  ACE Inhibitors Passed - 07/27/2019  8:20 AM      Passed - Cr in normal range and within 180 days    Creatinine  Date Value Ref Range Status  08/09/2012 0.96 0.60 - 1.30 mg/dL Final   Creatinine, Ser  Date Value Ref Range Status  05/04/2019 0.92 0.76 - 1.27 mg/dL Final         Passed - K in normal range and within 180 days    Potassium  Date Value Ref Range Status  05/04/2019 4.9 3.5 - 5.2 mmol/L Final  08/09/2012 4.3 3.5 - 5.1 mmol/L Final         Passed - Patient is not pregnant      Passed - Last BP in normal range    BP Readings from Last 1 Encounters:  07/25/19 131/82         Passed - Valid encounter within last 6 months    Recent Outpatient Visits          2 days ago    [facility], [place], [place]   2 months ago Essential hypertension, benign   [place], [place] T, NP    3 months ago Essential hypertension, benign   [place], [place] T, NP   4 months ago Essential hypertension, benign   [place], [place] T, NP   5 months ago Depression with anxiety   [place], [place] T, NP      Future Appointments            In 3 months Cannady, Barbaraann Faster, NP [facility]            predniSONE (DELTASONE) 10 MG tablet 42 tablet 0    Sig: Take 6 tabs daily x 2 days, 5 tabs daily x 2 days, 4 tabs daily x 2 days, etc     Not Delegated - Endocrinology:  Oral Corticosteroids Failed - 07/27/2019  8:20 AM      Failed - This refill cannot be delegated      Passed - Last  BP in normal range    BP Readings from Last 1 Encounters:  07/25/19 131/82         Passed - Valid encounter within last 6 months    Recent Outpatient Visits          2 days ago    [facility], [place], [place]   2 months ago Essential hypertension, benign   [place], [place] T, NP   3 months ago Essential hypertension, benign   [place], [place] T, NP   4 months ago Essential hypertension, benign   [place], [place] T, NP   5 months ago Depression with anxiety   [place], Barbaraann Faster, NP      Future Appointments            In 3 months Cannady, Barbaraann Faster, NP [facility]           Pt. Called requesting an emergency supply of medication while in [place], as he forgot his medication at home.  Called pt. To ask how long he will be in [place]/ how many pills he will need?  Stated he will be there about 5 days.  Refilled qty # 7 at this time.  Pt. Agreed with plan.

## 2019-07-27 NOTE — Telephone Encounter (Signed)
Patient called back requesting refill of medication 6/7- he is out of town and does not have Rx. Rx denied today- sent for review of request

## 2019-07-27 NOTE — Telephone Encounter (Signed)
Copied from [place] [id number]. Topic: General - Other >> Jul 27, 2019  3:09 PM Yvette Rack wrote: Reason for CRM: Pt stated his other medications were sent to [place] as he had requested however the Rx for predniSONE (DELTASONE) 10 MG tablet was not filled. Pt requests Rx for predniSONE (DELTASONE) 10 MG tablet to be transferred to [place], [place] - [address] AT [place] Phone: [phone]  Fax: [phone]

## 2019-07-27 NOTE — Telephone Encounter (Signed)
Medication Refill - Medication:  sertraline (ZOLOFT) 100 MG tablet lisinopril (ZESTRIL) 40 MG tablet predniSONE (DELTASONE) 10 MG tablet   Has the patient contacted their pharmacy?  Yes advised to call office. Patient is going to [facility] and has forgotten his medication. Patient is requesting emergency supply be sent in.   Preferred Pharmacy (with phone number or street name):  [facility] [id number] [place] [address] Phone: [phone]  Agent: Please be advised that RX refills may take up to 3 business days. We ask that you follow-up with your pharmacy.

## 2019-07-27 NOTE — Telephone Encounter (Signed)
Can we resend Prednisone to [facility] please? Had visit 07/25/19

## 2019-07-27 NOTE — Telephone Encounter (Signed)
Requested medication (s) are due for refill today:  No  Requested medication (s) are on the active medication list:  Yes  Future visit scheduled:  Yes  Last Refill: 07/25/19 #42; no refills.  Note to clinic: pt. Is traveling to [place] and forgot his medication at home.  Is requesting an emergency supply while there.  Reported will be there approx. 5 days.  Please send to [facility] @ [address] [place], [place], [address]  Requested Prescriptions  Pending Prescriptions Disp Refills   predniSONE (DELTASONE) 10 MG tablet 42 tablet 0    Sig: Take 6 tabs daily x 2 days, 5 tabs daily x 2 days, 4 tabs daily x 2 days, etc      Not Delegated - Endocrinology:  Oral Corticosteroids Failed - 07/27/2019  8:20 AM      Failed - This refill cannot be delegated      Passed - Last BP in normal range    BP Readings from Last 1 Encounters:  07/25/19 131/82          Passed - Valid encounter within last 6 months    Recent Outpatient Visits           2 days ago    [facility], [place], [place]   2 months ago Essential hypertension, benign   [place], [place] T, NP   3 months ago Essential hypertension, benign   [place], [place] T, NP   4 months ago Essential hypertension, benign   [place], [place] T, NP   5 months ago Depression with anxiety   [place], [place] T, NP       Future Appointments             In 3 months Cannady, [place] T, NP [facility]             Signed Prescriptions Disp Refills   sertraline (ZOLOFT) 100 MG tablet 7 tablet 0    Sig: Take 1.5 tablets (150 mg total) by mouth daily.      Psychiatry:  Antidepressants - SSRI Passed - 07/27/2019  8:20 AM      Passed - Completed PHQ-2 or PHQ-9 in the last 360 days.      Passed - Valid encounter within last 6 months    Recent Outpatient Visits           2 days ago    [facility], [place], [place]   2 months ago Essential hypertension, benign   [place], [place] T, NP   3 months ago Essential hypertension, benign   [place], [place] T, NP   4 months ago Essential hypertension, benign   [place], [place] T, NP   5 months ago Depression with anxiety   [place], [place] T, NP       Future Appointments             In 3 months Cannady, Barbaraann Faster, NP [facility]              lisinopril (ZESTRIL) 40 MG tablet 7 tablet 0    Sig: Take 1 tablet (40 mg total) by mouth daily.      Cardiovascular:  ACE Inhibitors Passed - 07/27/2019  8:20 AM      Passed - Cr in normal range and within 180 days    Creatinine  Date Value Ref Range Status  08/09/2012 0.96 0.60 - 1.30 mg/dL Final   Creatinine, Ser  Date Value Ref Range Status  05/04/2019 0.92 0.76 - 1.27 mg/dL Final          Passed - K in normal range and within 180 days    Potassium  Date Value Ref Range Status  05/04/2019 4.9 3.5 - 5.2 mmol/L Final  08/09/2012 4.3 3.5 - 5.1 mmol/L Final          Passed - Patient is not pregnant      Passed - Last BP in normal range    BP Readings from Last 1 Encounters:  07/25/19 131/82          Passed - Valid encounter within last 6 months    Recent Outpatient Visits           2 days ago    [facility], [place], [place]   2 months ago Essential hypertension, benign   [place], [place] T, NP   3 months ago Essential hypertension, benign   [place], [place] T, NP   4 months ago Essential hypertension, benign   [place], [place] T, NP   5 months ago Depression with anxiety   [place], Barbaraann Faster, NP       Future Appointments             In 3 months Cannady, Barbaraann Faster, NP [facility]

## 2019-07-28 MED ORDER — PREDNISONE 10 MG PO TABS: ORAL_TABLET | ORAL | 0 refills | Status: DC

## 2019-08-19 ENCOUNTER — Other Ambulatory Visit: Payer: Self-pay

## 2019-08-19 ENCOUNTER — Ambulatory Visit (INDEPENDENT_AMBULATORY_CARE_PROVIDER_SITE_OTHER): Payer: 59 | Admitting: Nurse Practitioner

## 2019-08-19 ENCOUNTER — Encounter: Payer: Self-pay | Admitting: Nurse Practitioner

## 2019-08-19 VITALS — BP 121/82 | HR 73 | Temp 98.2°F | Wt 251.0 lb

## 2019-08-19 DIAGNOSIS — R21 Rash and other nonspecific skin eruption: Secondary | ICD-10-CM | POA: Diagnosis not present

## 2019-08-19 MED ORDER — CLOBETASOL PROPIONATE 0.05 % EX CREA: 1.0000 "application " | TOPICAL_CREAM | Freq: Two times a day (BID) | CUTANEOUS | 0 refills | Status: DC

## 2019-08-19 MED ORDER — MONTELUKAST SODIUM 10 MG PO TABS
10.0000 mg | ORAL_TABLET | Freq: Every day | ORAL | 3 refills | Status: DC
Start: 2019-08-19 — End: 2019-11-25

## 2019-08-19 NOTE — Patient Instructions (Signed)

## 2019-08-19 NOTE — Progress Notes (Signed)
BP 121/82   Pulse 73   Temp 98.2 F (36.8 C) (Oral)   Wt 251 lb (113.9 kg)   SpO2 94%   BMI 36.85 kg/m    Subjective:    Patient ID: Joe Gutierrez, male    DOB: December 14, 1975, 44 y.o.   MRN: [id number]  HPI: Joe Gutierrez is a 44 y.o. male  Chief Complaint  Patient presents with  . Rash    pt states got better but seems like the rash is coming back   RASH Seen on 07/25/2019 for urticarial rash and treated with Prednisone taper and PRN Triamcinolone, he was also instructed to increase antihistamine to BID dosing. Has had issues with rash for years (last 3-4 years) to bilateral legs and abdomen intermittently, that is triggered by heat and sweating.  Denies oral swelling, wheezing, SOB, N&V.    He feels over this past year it has become worse.  Notices it more in the summer time, less in winter time.  Is pruritic.  With recent treatment -- he reports it improved some but not 100%.   Duration:  weeks  Location: abdomen and legs Itching: yes Burning: no Redness: yes Oozing: no Scaling: no Blisters: occasional Painful: no Fevers: no Change in detergents/soaps/personal care products: no Recent illness: no Recent travel:yes History of same: yes Context: fluctuating Alleviating factors: Triamcinolone cream and Prednisone Treatments attempted: Triamcinolone and Prednisone, Zyrtec Shortness of breath: no  Throat/tongue swelling: no Myalgias/arthralgias: no  Relevant past medical, surgical, family and social history reviewed and updated as indicated. Interim medical history since our last visit reviewed. Allergies and medications reviewed and updated.  Review of Systems  Constitutional: Negative for activity change, diaphoresis, fatigue and fever.  Respiratory: Negative for cough, chest tightness, shortness of breath and wheezing.   Cardiovascular: Negative for chest pain, palpitations and leg swelling.  Gastrointestinal: Negative.   Skin: Positive for rash.    Neurological: Negative.   Psychiatric/Behavioral: Negative.     Per HPI unless specifically indicated above     Objective:    BP 121/82   Pulse 73   Temp 98.2 F (36.8 C) (Oral)   Wt 251 lb (113.9 kg)   SpO2 94%   BMI 36.85 kg/m   Wt Readings from Last 3 Encounters:  08/19/19 251 lb (113.9 kg)  07/25/19 254 lb (115.2 kg)  01/03/19 263 lb (119.3 kg)    Physical Exam Vitals and nursing note reviewed.  Constitutional:      General: He is awake. He is not in acute distress.    Appearance: He is well-developed and well-groomed. He is obese. He is not ill-appearing.  HENT:     Head: Normocephalic and atraumatic.     Right Ear: Hearing normal. No drainage.     Left Ear: Hearing normal. No drainage.  Eyes:     General: Lids are normal.        Right eye: No discharge.        Left eye: No discharge.     Conjunctiva/sclera: Conjunctivae normal.     Pupils: Pupils are equal, round, and reactive to light.  Neck:     Thyroid: No thyromegaly.     Vascular: No carotid bruit.     Trachea: Trachea normal.  Cardiovascular:     Rate and Rhythm: Normal rate and regular rhythm.     Heart sounds: Normal heart sounds, S1 normal and S2 normal. No murmur heard.  No gallop.   Pulmonary:  Effort: Pulmonary effort is normal. No accessory muscle usage or respiratory distress.     Breath sounds: Normal breath sounds.  Abdominal:     General: Bowel sounds are normal.     Palpations: Abdomen is soft.  Musculoskeletal:        General: Normal range of motion.     Cervical back: Normal range of motion and neck supple.     Right lower leg: No edema.     Left lower leg: No edema.  Skin:    General: Skin is warm and dry.     Capillary Refill: Capillary refill takes less than 2 seconds.     Findings: Rash present. Rash is urticarial.     Comments: Dense urticarial type rash present diffusely across lower abdomen and upper legs bilaterally, extending down to ankles bilaterally in patchy  areas. Erythematous present, not scabbed or draining, no blisters or vesicles.  Slight scaling around exterior in areas.  Neurological:     Mental Status: He is alert and oriented to person, place, and time.  Psychiatric:        Attention and Perception: Attention normal.        Mood and Affect: Mood normal.        Speech: Speech normal.        Behavior: Behavior normal. Behavior is cooperative.        Thought Content: Thought content normal.     Results for orders placed or performed in visit on 05/04/19  VITAMIN D 25 Hydroxy (Vit-D Deficiency, Fractures)  Result Value Ref Range   Vit D, 25-Hydroxy 31.0 30.0 - 100.0 ng/mL  Basic metabolic panel  Result Value Ref Range   Glucose 86 65 - 99 mg/dL   BUN 11 6 - 24 mg/dL   Creatinine, Ser 0.92 0.76 - 1.27 mg/dL   GFR calc non Af Amer 102 >59 mL/min/1.73   GFR calc Af Amer 117 >59 mL/min/1.73   BUN/Creatinine Ratio 12 9 - 20   Sodium 142 134 - 144 mmol/L   Potassium 4.9 3.5 - 5.2 mmol/L   Chloride 104 96 - 106 mmol/L   CO2 23 20 - 29 mmol/L   Calcium 9.3 8.7 - 10.2 mg/dL      Assessment & Plan:   Problem List Items Addressed This Visit      Musculoskeletal and Integument   Rash - Primary    Ongoing with minimal improvement with Prednisone taper, Zyrtec, and Triamcinolone.  ? Nummular dermatitis vs allergic type rash.  Scripts for Clobetasol 0.05% cream and Montelukast sent in.  Referral to dermatology placed due to ongoing issues with rash.  No recent new medications or products at home.  Return to office for worsening or as schedule in September.      Relevant Orders   Ambulatory referral to Dermatology       Follow up plan: Return for as scheduled in September.

## 2019-08-19 NOTE — Assessment & Plan Note (Signed)
Ongoing with minimal improvement with Prednisone taper, Zyrtec, and Triamcinolone.  ? Nummular dermatitis vs allergic type rash.  Scripts for Clobetasol 0.05% cream and Montelukast sent in.  Referral to dermatology placed due to ongoing issues with rash.  No recent new medications or products at home.  Return to office for worsening or as schedule in September.

## 2019-08-28 ENCOUNTER — Other Ambulatory Visit: Payer: Self-pay | Admitting: Nurse Practitioner

## 2019-08-28 NOTE — Telephone Encounter (Signed)
Requested Prescriptions  Pending Prescriptions Disp Refills   sertraline (ZOLOFT) 100 MG tablet [Pharmacy Med Name: SERTRALINE 100MG  TABLETS] 135 tablet 0    Sig: TAKE 1 AND 1/2 TABLETS(150 MG) BY MOUTH DAILY     Psychiatry:  Antidepressants - SSRI Passed - 08/28/2019  3:16 AM      Passed - Completed PHQ-2 or PHQ-9 in the last 360 days.      Passed - Valid encounter within last 6 months    Recent Outpatient Visits          1 week ago [place], Henrine Screws T, NP   1 month ago [place], [place], [place]   3 months ago Essential hypertension, benign   [place], Harrison T, NP   5 months ago Essential hypertension, benign   [place], [place] T, NP   6 months ago Essential hypertension, benign   [place], Barbaraann Faster, NP      Future Appointments            In 2 months [place], Barbaraann Faster, NP [facility]

## 2019-08-29 MED ORDER — SERTRALINE HCL 100 MG PO TABS: ORAL_TABLET | ORAL | 4 refills | Status: DC

## 2019-08-29 NOTE — Addendum Note (Signed)
Addended by: Georgina Peer on: 08/29/2019 08:23 AM   Modules accepted: Orders

## 2019-09-21 ENCOUNTER — Other Ambulatory Visit: Payer: Self-pay

## 2019-09-21 MED ORDER — LISINOPRIL 40 MG PO TABS: 40.0000 mg | ORAL_TABLET | Freq: Every day | ORAL | 0 refills | Status: DC

## 2019-09-21 NOTE — Telephone Encounter (Signed)
Pharmacy sent a fax requesting 90 day RX for Lisinopril. Patient's last routine visit was in March, has appointment scheduled for September for a follow up.

## 2019-11-09 ENCOUNTER — Encounter: Payer: 59 | Admitting: Nurse Practitioner

## 2019-11-25 ENCOUNTER — Encounter: Payer: Self-pay | Admitting: Nurse Practitioner

## 2019-11-25 ENCOUNTER — Ambulatory Visit: Payer: 59 | Admitting: Nurse Practitioner

## 2019-11-25 ENCOUNTER — Other Ambulatory Visit: Payer: Self-pay

## 2019-11-25 VITALS — BP 117/84 | HR 62 | Temp 98.7°F | Ht 70.0 in | Wt 251.0 lb

## 2019-11-25 DIAGNOSIS — F418 Other specified anxiety disorders: Secondary | ICD-10-CM

## 2019-11-25 DIAGNOSIS — Z6836 Body mass index (BMI) 36.0-36.9, adult: Secondary | ICD-10-CM

## 2019-11-25 DIAGNOSIS — Z23 Encounter for immunization: Secondary | ICD-10-CM

## 2019-11-25 DIAGNOSIS — I10 Essential (primary) hypertension: Secondary | ICD-10-CM | POA: Diagnosis not present

## 2019-11-25 DIAGNOSIS — R7301 Impaired fasting glucose: Secondary | ICD-10-CM

## 2019-11-25 DIAGNOSIS — G4733 Obstructive sleep apnea (adult) (pediatric): Secondary | ICD-10-CM

## 2019-11-25 DIAGNOSIS — E559 Vitamin D deficiency, unspecified: Secondary | ICD-10-CM | POA: Diagnosis not present

## 2019-11-25 DIAGNOSIS — E6609 Other obesity due to excess calories: Secondary | ICD-10-CM

## 2019-11-25 DIAGNOSIS — Z0000 Encounter for general adult medical examination without abnormal findings: Secondary | ICD-10-CM

## 2019-11-25 DIAGNOSIS — N400 Enlarged prostate without lower urinary tract symptoms: Secondary | ICD-10-CM

## 2019-11-25 MED ORDER — LISINOPRIL 40 MG PO TABS
40.0000 mg | ORAL_TABLET | Freq: Every day | ORAL | 4 refills | Status: DC
Start: 2019-11-25 — End: 2020-04-03

## 2019-11-25 NOTE — Assessment & Plan Note (Signed)
Chronic, ongoing.  Improved mood.  Continue Sertraline 150 MG and adjust as needed.  Denies SI/HI.  Return in 6 months.

## 2019-11-25 NOTE — Assessment & Plan Note (Signed)
Consider repeat sleep study in future, at this time continue side sleeping as instructed. 

## 2019-11-25 NOTE — Assessment & Plan Note (Signed)
History of enlarged prostate on exams, check PSA today.  No current symptoms.

## 2019-11-25 NOTE — Assessment & Plan Note (Signed)
BMI 36.01.  Recommended eating smaller high protein, low fat meals more frequently and exercising 30 mins a day 5 times a week with a goal of 10-15lb weight loss in the next 3 months. Patient voiced their understanding and motivation to adhere to these recommendations.

## 2019-11-25 NOTE — Assessment & Plan Note (Addendum)
Chronic, stable with BP at goal today. Continue Lisinopril 40 MG at this time and if elevations above goal in future consider addition of Amlodipine.  Recommend he continue to monitor BP at home and document + focus on DASH diet..  To notify provider if any increase and consistency in cough.  Refills sent.  CMP and TSH + lipid panel today. Return in 6 months.

## 2019-11-25 NOTE — Assessment & Plan Note (Signed)
Recheck Vit D, if continues to be low start higher dose weekly Vit D for 6-8 weeks.  Continue at home supplement at this time.

## 2019-11-25 NOTE — Patient Instructions (Signed)
Healthy Eating Following a healthy eating pattern may help you to achieve and maintain a healthy body weight, reduce the risk of chronic disease, and live a long and productive life. It is important to follow a healthy eating pattern at an appropriate calorie level for your body. Your nutritional needs should be met primarily through food by choosing a variety of nutrient-rich foods. What are tips for following this plan? Reading food labels  Read labels and choose the following: ? Reduced or low sodium. ? Juices with 100% fruit juice. ? Foods with low saturated fats and high polyunsaturated and monounsaturated fats. ? Foods with whole grains, such as whole wheat, cracked wheat, brown rice, and wild rice. ? Whole grains that are fortified with folic acid. This is recommended for women who are pregnant or who want to become pregnant.  Read labels and avoid the following: ? Foods with a lot of added sugars. These include foods that contain brown sugar, corn sweetener, corn syrup, dextrose, fructose, glucose, high-fructose corn syrup, honey, invert sugar, lactose, malt syrup, maltose, molasses, raw sugar, sucrose, trehalose, or turbinado sugar.  Do not eat more than the following amounts of added sugar per day:  6 teaspoons (25 g) for women.  9 teaspoons (38 g) for men. ? Foods that contain processed or refined starches and grains. ? Refined grain products, such as white flour, degermed cornmeal, white bread, and white rice. Shopping  Choose nutrient-rich snacks, such as vegetables, whole fruits, and nuts. Avoid high-calorie and high-sugar snacks, such as potato chips, fruit snacks, and candy.  Use oil-based dressings and spreads on foods instead of solid fats such as butter, stick margarine, or cream cheese.  Limit pre-made sauces, mixes, and "instant" products such as flavored rice, instant noodles, and ready-made pasta.  Try more plant-protein sources, such as tofu, tempeh, black beans,  edamame, lentils, nuts, and seeds.  Explore eating plans such as the Mediterranean diet or vegetarian diet. Cooking  Use oil to saut or stir-fry foods instead of solid fats such as butter, stick margarine, or lard.  Try baking, boiling, grilling, or broiling instead of frying.  Remove the fatty part of meats before cooking.  Steam vegetables in water or broth. Meal planning   At meals, imagine dividing your plate into fourths: ? One-half of your plate is fruits and vegetables. ? One-fourth of your plate is whole grains. ? One-fourth of your plate is protein, especially lean meats, poultry, eggs, tofu, beans, or nuts.  Include low-fat dairy as part of your daily diet. Lifestyle  Choose healthy options in all settings, including home, work, school, restaurants, or stores.  Prepare your food safely: ? Wash your hands after handling raw meats. ? Keep food preparation surfaces clean by regularly washing with hot, soapy water. ? Keep raw meats separate from ready-to-eat foods, such as fruits and vegetables. ? Cook seafood, meat, poultry, and eggs to the recommended internal temperature. ? Store foods at safe temperatures. In general:  Keep cold foods at 59F (4.4C) or below.  Keep hot foods at 159F (60C) or above.  Keep your freezer at [facility] (-17.8C) or below.  Foods are no longer safe to eat when they have been between the temperatures of 40-159F (4.4-60C) for more than 2 hours. What foods should I eat? Fruits Aim to eat 2 cup-equivalents of fresh, canned (in natural juice), or frozen fruits each day. Examples of 1 cup-equivalent of fruit include 1 small apple, 8 large strawberries, 1 cup canned fruit,  cup  dried fruit, or 1 cup 100% juice. Vegetables Aim to eat 2-3 cup-equivalents of fresh and frozen vegetables each day, including different varieties and colors. Examples of 1 cup-equivalent of vegetables include 2 medium carrots, 2 cups raw, leafy greens, 1 cup chopped  vegetable (raw or cooked), or 1 medium baked potato. Grains Aim to eat 6 ounce-equivalents of whole grains each day. Examples of 1 ounce-equivalent of grains include 1 slice of bread, 1 cup ready-to-eat cereal, 3 cups popcorn, or  cup cooked rice, pasta, or cereal. Meats and other proteins Aim to eat 5-6 ounce-equivalents of protein each day. Examples of 1 ounce-equivalent of protein include 1 egg, 1/2 cup nuts or seeds, or 1 tablespoon (16 g) peanut butter. A cut of meat or fish that is the size of a deck of cards is about 3-4 ounce-equivalents.  Of the protein you eat each week, try to have at least 8 ounces come from seafood. This includes salmon, trout, herring, and anchovies. Dairy Aim to eat 3 cup-equivalents of fat-free or low-fat dairy each day. Examples of 1 cup-equivalent of dairy include 1 cup (240 mL) milk, 8 ounces (250 g) yogurt, 1 ounces (44 g) natural cheese, or 1 cup (240 mL) fortified soy milk. Fats and oils  Aim for about 5 teaspoons (21 g) per day. Choose monounsaturated fats, such as canola and olive oils, avocados, peanut butter, and most nuts, or polyunsaturated fats, such as sunflower, corn, and soybean oils, walnuts, pine nuts, sesame seeds, sunflower seeds, and flaxseed. Beverages  Aim for six 8-oz glasses of water per day. Limit coffee to three to five 8-oz cups per day.  Limit caffeinated beverages that have added calories, such as soda and energy drinks.  Limit alcohol intake to no more than 1 drink a day for nonpregnant women and 2 drinks a day for men. One drink equals 12 oz of beer (355 mL), 5 oz of wine (148 mL), or 1 oz of hard liquor (44 mL). Seasoning and other foods  Avoid adding excess amounts of salt to your foods. Try flavoring foods with herbs and spices instead of salt.  Avoid adding sugar to foods.  Try using oil-based dressings, sauces, and spreads instead of solid fats. This information is based on general [place] nutrition guidelines. For more  information, visit [employer]. Exact amounts may vary based on your nutrition needs. Summary  A healthy eating plan may help you to maintain a healthy weight, reduce the risk of chronic diseases, and stay active throughout your life.  Plan your meals. Make sure you eat the right portions of a variety of nutrient-rich foods.  Try baking, boiling, grilling, or broiling instead of frying.  Choose healthy options in all settings, including home, work, school, restaurants, or stores. This information is not intended to replace advice given to you by your health care provider. Make sure you discuss any questions you have with your health care provider. Document Revised: 05/18/2017 Document Reviewed: 05/18/2017 Elsevier Patient Education  [place].

## 2019-11-25 NOTE — Progress Notes (Signed)
BP 117/84 (BP Location: Left Arm, Patient Position: Sitting, Cuff Size: Large)    Pulse 62    Temp 98.7 F (37.1 C) (Oral)    Ht 5\' 10"  (1.778 m)    Wt 251 lb (113.9 kg)    SpO2 96%    BMI 36.01 kg/m    Subjective:    Patient ID: Joe Gutierrez, male    DOB: [birth year], 44 y.o.   MRN: [id number]  HPI: Joe Gutierrez is a 44 y.o. male presenting on 11/25/2019 for comprehensive medical examination. Current medical complaints include:none  He currently lives with: wife and children Interim Problems from his last visit: no   HYPERTENSION Continues on Lisinopril 40 MG daily.  No current statin. Hypertension status: stable  Satisfied with current treatment? yes Duration of hypertension: chronic BP monitoring frequency: rarely BP range: 110/80 range BP medication side effects:  no Medication compliance: good compliance Aspirin: no Recurrent headaches: no Visual changes: no Palpitations: no Dyspnea: no Chest pain: no Lower extremity edema: no Dizzy/lightheaded: no  The 10-year ASCVD risk score [name], et al., 2013) is: 2.5%   Values used to calculate the score:     Age: 50 years     Sex: Male     Is Non-Hispanic African American: No     Diabetic: No     Tobacco smoker: No     Systolic Blood Pressure: 659 mmHg     Is BP treated: Yes     HDL Cholesterol: 39 mg/dL     Total Cholesterol: 201 mg/dL  DEPRESSION Continues on Sertraline 150 MG daily + takes Vitamin D for history low levels.  Last level 28.2. Mood status: stable Satisfied with current treatment?: yes Symptom severity: mild  Duration of current treatment : chronic Side effects: no Medication compliance: good compliance Psychotherapy/counseling: none Previous psychiatric medications: Paxil and Buspar Depressed mood: no Anxious mood: yes Anhedonia: no Significant weight loss or gain: no Insomnia: none Fatigue: no Feelings of worthlessness or guilt: no Impaired concentration/indecisiveness:  no Suicidal ideations: no Hopelessness: no Crying spells: no  Depression screen [facility] 2/9 11/25/2019 08/19/2019 05/02/2019 03/30/2019 01/31/2019  Decreased Interest 0 0 0 1 0  Down, Depressed, Hopeless 0 0 1 1 1   PHQ - 2 Score 0 0 1 2 1   Altered sleeping 0 1 1 1 1   Tired, decreased energy 0 1 0 1 1  Change in appetite 0 0 0 1 1  Feeling bad or failure about yourself  0 0 0 1 1  Trouble concentrating 0 0 1 1 1   Moving slowly or fidgety/restless 0 0 0 1 0  Suicidal thoughts 0 0 0 0 0  PHQ-9 Score 0 2 3 8 6   Difficult doing work/chores Not difficult at all - Not difficult at all Not difficult at all Not difficult at all  Some recent data might be hidden    IFG:  Recent labs with glucose 100 to 105 -- A1C last year 5.2%.  Hypoglycemic episodes:no Polydipsia/polyuria: no Visual disturbance: no Chest pain: no Paresthesias: no   SLEEP APNEA Had sleep study over a year ago, but never went for machine.  Testing in 2018. Sleep apnea status: stable Duration: chronic Satisfied with current treatment?:  no CPAP use:  no Sleep quality with CPAP use: poor Treament compliance:poor compliance Last sleep study:  Treatments attempted:  Wakes feeling refreshed:  no Daytime hypersomnolence:  none Fatigue:  none Insomnia:  none Good sleep hygiene:  yes Difficulty falling  asleep:  none Difficulty staying asleep:  none Snoring bothers bed partner:  none Observed apnea by bed partner: none Obesity:  yes Hypertension: yes  Pulmonary hypertension:  no Coronary artery disease:  no  Functional Status Survey: Is the patient deaf or have difficulty hearing?: No Does the patient have difficulty seeing, even when wearing glasses/contacts?: No Does the patient have difficulty concentrating, remembering, or making decisions?: No Does the patient have difficulty walking or climbing stairs?: No Does the patient have difficulty dressing or bathing?: No Does the patient have difficulty doing errands  alone such as visiting a doctor's office or shopping?: No  FALL RISK: Fall Risk  11/25/2019 09/21/2017 10/10/2015  Falls in the past year? 0 No No  Number falls in past yr: 0 - -  Injury with Fall? 0 - -  Risk for fall due to : No Fall Risks - -  Follow up Falls evaluation completed - -   Advanced Directives <no information>  Past Medical History:  Past Medical History:  Diagnosis Date   Hypertension    Kidney stones    Obesity     Surgical History:  Past Surgical History:  Procedure Laterality Date   APPENDECTOMY     LITHOTRIPSY      Medications:  Current Outpatient Medications on File Prior to Visit  Medication Sig   sertraline (ZOLOFT) 100 MG tablet TAKE 1 AND 1/2 TABLETS(150 MG) BY MOUTH DAILY   Vitamin D, Cholecalciferol, 25 MCG (1000 UT) TABS Take 1 tablet by mouth daily.   No current facility-administered medications on file prior to visit.    Allergies:  Allergies  Allergen Reactions   Codeine Hives   Sulfa Antibiotics Hives    Social History:  Social History   Socioeconomic History   Marital status: Married    Spouse name: Not on file   Number of children: Not on file   Years of education: Not on file   Highest education level: Not on file  Occupational History   Not on file  Tobacco Use   Smoking status: Never Smoker   Smokeless tobacco: Never Used  Vaping Use   Vaping Use: Never used  Substance and Sexual Activity   Alcohol use: No   Drug use: No   Sexual activity: Yes  Other Topics Concern   Not on file  Social History Narrative   Not on file   Social Determinants of Health   Financial Resource Strain:    Difficulty of Paying Living Expenses: Not on file  Food Insecurity:    Worried About [place] in the Last Year: Not on file   Ran Out of Food in the Last Year: Not on file  Transportation Needs:    Lack of Transportation (Medical): Not on file   Lack of Transportation (Non-Medical): Not on  file  Physical Activity:    Days of Exercise per Week: Not on file   Minutes of Exercise per Session: Not on file  Stress:    Feeling of Stress : Not on file  Social Connections:    Frequency of Communication with Friends and Family: Not on file   Frequency of Social Gatherings with Friends and Family: Not on file   Attends Religious Services: Not on file   Active Member of Clubs or Organizations: Not on file   Attends Archivist Meetings: Not on file   Marital Status: Not on file  Intimate Partner Violence:    Fear of Current or Ex-Partner:  Not on file   Emotionally Abused: Not on file   Physically Abused: Not on file   Sexually Abused: Not on file   Social History   Tobacco Use  Smoking Status Never Smoker  Smokeless Tobacco Never Used   Social History   Substance and Sexual Activity  Alcohol Use No    Family History:  Family History  Problem Relation Age of Onset   Diabetes Mother    Hypertension Mother    Alcohol abuse Father    Heart disease Father    Hypertension Father    Diabetes Maternal Grandmother    Stroke Maternal Grandfather    Hypertension Brother    Asthma Daughter    Heart disease Paternal Grandfather     Past medical history, surgical history, medications, allergies, family history and social history reviewed with patient today and changes made to appropriate areas of the chart.   Review of Systems - negative All other ROS negative except what is listed above and in the HPI.      Objective:    BP 117/84 (BP Location: Left Arm, Patient Position: Sitting, Cuff Size: Large)    Pulse 62    Temp 98.7 F (37.1 C) (Oral)    Ht 5\' 10"  (1.778 m)    Wt 251 lb (113.9 kg)    SpO2 96%    BMI 36.01 kg/m   Wt Readings from Last 3 Encounters:  11/25/19 251 lb (113.9 kg)  08/19/19 251 lb (113.9 kg)  07/25/19 254 lb (115.2 kg)    Physical Exam Vitals signs and nursing note reviewed.  Constitutional:      General: He is  awake. He is not in acute distress.    Appearance: He is well-developed. He is obese. He is not ill-appearing.  HENT:     Head: Normocephalic and atraumatic.     Right Ear: Hearing, tympanic membrane, ear canal and external ear normal. No drainage.     Left Ear: Hearing, tympanic membrane, ear canal and external ear normal. No drainage.     Nose: Nose normal.     Mouth/Throat:     Mouth: Mucous membranes are moist.     Pharynx: Oropharynx is clear. Uvula midline.  Eyes:     General: Lids are normal.        Right eye: No discharge.        Left eye: No discharge.     Extraocular Movements: Extraocular movements intact.     Conjunctiva/sclera: Conjunctivae normal.     Pupils: Pupils are equal, round, and reactive to light.     Visual Fields: Right eye visual fields normal and left eye visual fields normal.  Neck:     Musculoskeletal: Normal range of motion and neck supple.     Thyroid: No thyromegaly.     Vascular: No carotid bruit.     Trachea: Trachea normal.  Cardiovascular:     Rate and Rhythm: Normal rate and regular rhythm.     Heart sounds: Normal heart sounds, S1 normal and S2 normal. No murmur. No gallop.   Pulmonary:     Effort: Pulmonary effort is normal. No accessory muscle usage or respiratory distress.     Breath sounds: Normal breath sounds.  Abdominal:     General: Bowel sounds are normal.     Palpations: Abdomen is soft. There is no hepatomegaly or splenomegaly.     Tenderness: There is no abdominal tenderness.     Hernia: There is no hernia in the  left inguinal area or right inguinal area.  Genitourinary:    Penis: Normal.      Scrotum/Testes: Normal.     Epididymis:     Right: Normal.     Left: Normal.  Musculoskeletal: Normal range of motion.     Right lower leg: No edema.     Left lower leg: No edema.  Skin:    General: Skin is warm and dry.     Capillary Refill: Capillary refill takes less than 2 seconds.     Findings: No rash.  Neurological:      Mental Status: He is alert and oriented to person, place, and time.     Cranial Nerves: Cranial nerves are intact.     Gait: Gait is intact.     Deep Tendon Reflexes: Reflexes are normal and symmetric.     Reflex Scores:      Brachioradialis reflexes are 2+ on the right side and 2+ on the left side.      Patellar reflexes are 2+ on the right side and 2+ on the left side. Psychiatric:        Attention and Perception: Attention normal.        Mood and Affect: Mood normal.        Speech: Speech normal.        Behavior: Behavior normal. Behavior is cooperative.        Thought Content: Thought content normal.        Judgment: Judgment normal.    Results for orders placed or performed in visit on 05/04/19  VITAMIN D 25 Hydroxy (Vit-D Deficiency, Fractures)  Result Value Ref Range   Vit D, 25-Hydroxy 31.0 30.0 - 100.0 ng/mL  Basic metabolic panel  Result Value Ref Range   Glucose 86 65 - 99 mg/dL   BUN 11 6 - 24 mg/dL   Creatinine, Ser 0.92 0.76 - 1.27 mg/dL   GFR calc non Af Amer 102 >59 mL/min/1.73   GFR calc Af Amer 117 >59 mL/min/1.73   BUN/Creatinine Ratio 12 9 - 20   Sodium 142 134 - 144 mmol/L   Potassium 4.9 3.5 - 5.2 mmol/L   Chloride 104 96 - 106 mmol/L   CO2 23 20 - 29 mmol/L   Calcium 9.3 8.7 - 10.2 mg/dL      Assessment & Plan:   Problem List Items Addressed This Visit      Cardiovascular and Mediastinum   Essential hypertension, benign    Chronic, stable with BP at goal today. Continue Lisinopril 40 MG at this time and if elevations above goal in future consider addition of Amlodipine.  Recommend he continue to monitor BP at home and document + focus on DASH diet..  To notify provider if any increase and consistency in cough.  Refills sent.  CMP and TSH + lipid panel today. Return in 6 months.      Relevant Medications   lisinopril (ZESTRIL) 40 MG tablet   Other Relevant Orders   CBC with Differential/Platelet   Comprehensive metabolic panel   Lipid Panel w/o  Chol/HDL Ratio   TSH     Respiratory   Sleep apnea    Consider repeat sleep study in future, at this time continue side sleeping as instructed.        Endocrine   IFG (impaired fasting glucose)    Check CMP today, last A1C was 5.2%.  Discussed monitoring food intake high in sugar and carbohydrates.        Genitourinary  Benign prostatic hyperplasia without lower urinary tract symptoms    History of enlarged prostate on exams, check PSA today.  No current symptoms.      Relevant Orders   PSA     Other   Depression with anxiety    Chronic, ongoing.  Improved mood.  Continue Sertraline 150 MG and adjust as needed.  Denies SI/HI.  Return in 6 months.      Obesity    BMI 36.01.  Recommended eating smaller high protein, low fat meals more frequently and exercising 30 mins a day 5 times a week with a goal of 10-15lb weight loss in the next 3 months. Patient voiced their understanding and motivation to adhere to these recommendations.       Vitamin D deficiency    Recheck Vit D, if continues to be low start higher dose weekly Vit D for 6-8 weeks.  Continue at home supplement at this time.      Relevant Orders   VITAMIN D 25 Hydroxy (Vit-D Deficiency, Fractures)    Other Visit Diagnoses    Routine general medical examination at a health care facility    -  Primary   Annual labs obtained today.   Need for influenza vaccination       Relevant Orders   Flu Vaccine QUAD 36+ mos IM (Completed)      Discussed aspirin prophylaxis for myocardial infarction prevention and decision was it was not indicated  LABORATORY TESTING:  Health maintenance labs ordered today as discussed above.   The natural history of prostate cancer and ongoing controversy regarding screening and potential treatment outcomes of prostate cancer has been discussed with the patient. The meaning of a false positive PSA and a false negative PSA has been discussed. He indicates understanding of the limitations  of this screening test and wishes to proceed with screening PSA testing.   IMMUNIZATIONS:   - Tdap: Tetanus vaccination status reviewed: last tetanus booster within 10 years. - Influenza: Up to date - Pneumovax: Not applicable - Prevnar: Not applicable - Zostavax vaccine: Not applicable  SCREENING: - Colonoscopy: Not applicable  Discussed with patient purpose of the colonoscopy is to detect colon cancer at curable precancerous or early stages   - AAA Screening: Not applicable  -Hearing Test: Not applicable  -Spirometry: Not applicable   PATIENT COUNSELING:    Sexuality: Discussed sexually transmitted diseases, partner selection, use of condoms, avoidance of unintended pregnancy  and contraceptive alternatives.   Advised to avoid cigarette smoking.  I discussed with the patient that most people either abstain from alcohol or drink within safe limits (<=14/week and <=4 drinks/occasion for males, <=7/weeks and <= 3 drinks/occasion for females) and that the risk for alcohol disorders and other health effects rises proportionally with the number of drinks per week and how often a drinker exceeds daily limits.  Discussed cessation/primary prevention of drug use and availability of treatment for abuse.   Diet: Encouraged to adjust caloric intake to maintain  or achieve ideal body weight, to reduce intake of dietary saturated fat and total fat, to limit sodium intake by avoiding high sodium foods and not adding table salt, and to maintain adequate dietary potassium and calcium preferably from fresh fruits, vegetables, and low-fat dairy products.    stressed the importance of regular exercise  Injury prevention: Discussed safety belts, safety helmets, smoke detector, smoking near bedding or upholstery.   Dental health: Discussed importance of regular tooth brushing, flossing, and dental visits.   Follow up  plan: NEXT PREVENTATIVE PHYSICAL DUE IN 1 YEAR. Return in about 6 months (around  05/25/2020) for HTN and MOOD.

## 2019-11-25 NOTE — Assessment & Plan Note (Addendum)
Check CMP today, last A1C was 5.2%.  Discussed monitoring food intake high in sugar and carbohydrates.

## 2019-11-26 LAB — COMPREHENSIVE METABOLIC PANEL
ALT: 14 IU/L (ref 0–44)
AST: 16 IU/L (ref 0–40)
Albumin/Globulin Ratio: 2 (ref 1.2–2.2)
Albumin: 4.6 g/dL (ref 4.0–5.0)
Alkaline Phosphatase: 78 IU/L (ref 44–121)
BUN/Creatinine Ratio: 13 (ref 9–20)
BUN: 12 mg/dL (ref 6–24)
Bilirubin Total: 0.4 mg/dL (ref 0.0–1.2)
CO2: 23 mmol/L (ref 20–29)
Calcium: 9.2 mg/dL (ref 8.7–10.2)
Chloride: 101 mmol/L (ref 96–106)
Creatinine, Ser: 0.94 mg/dL (ref 0.76–1.27)
GFR calc Af Amer: 114 mL/min/{1.73_m2} (ref >59)
GFR calc non Af Amer: 98 mL/min/{1.73_m2} (ref >59)
Globulin, Total: 2.3 g/dL (ref 1.5–4.5)
Glucose: 87 mg/dL (ref 65–99)
Potassium: 4.5 mmol/L (ref 3.5–5.2)
Sodium: 140 mmol/L (ref 134–144)
Total Protein: 6.9 g/dL (ref 6.0–8.5)

## 2019-11-26 LAB — CBC WITH DIFFERENTIAL/PLATELET
Basophils Absolute: 0.1 10*3/uL (ref 0.0–0.2)
Basos: 1 %
EOS (ABSOLUTE): 0.2 10*3/uL (ref 0.0–0.4)
Eos: 3 %
Hematocrit: 43.8 % (ref 37.5–51.0)
Hemoglobin: 14.7 g/dL (ref 13.0–17.7)
Immature Grans (Abs): 0.1 10*3/uL (ref 0.0–0.1)
Immature Granulocytes: 1 %
Lymphocytes Absolute: 1.5 10*3/uL (ref 0.7–3.1)
Lymphs: 23 %
MCH: 29 pg (ref 26.6–33.0)
MCHC: 33.6 g/dL (ref 31.5–35.7)
MCV: 86 fL (ref 79–97)
Monocytes Absolute: 0.7 10*3/uL (ref 0.1–0.9)
Monocytes: 10 %
Neutrophils Absolute: 4.2 10*3/uL (ref 1.4–7.0)
Neutrophils: 62 %
Platelets: 223 10*3/uL (ref 150–450)
RBC: 5.07 x10E6/uL (ref 4.14–5.80)
RDW: 12 % (ref 11.6–15.4)
WBC: 6.6 10*3/uL (ref 3.4–10.8)

## 2019-11-26 LAB — LIPID PANEL W/O CHOL/HDL RATIO
Cholesterol, Total: 202 mg/dL — ABNORMAL HIGH (ref 100–199)
HDL: 38 mg/dL — ABNORMAL LOW (ref >39)
LDL Chol Calc (NIH): 141 mg/dL — ABNORMAL HIGH (ref 0–99)
Triglycerides: 124 mg/dL (ref 0–149)
VLDL Cholesterol Cal: 23 mg/dL (ref 5–40)

## 2019-11-26 LAB — VITAMIN D 25 HYDROXY (VIT D DEFICIENCY, FRACTURES): Vit D, 25-Hydroxy: 32.1 ng/mL (ref 30.0–100.0)

## 2019-11-26 LAB — PSA: Prostate Specific Ag, Serum: 1.2 ng/mL (ref 0.0–4.0)

## 2019-11-26 LAB — TSH: TSH: 1.16 u[IU]/mL (ref 0.450–4.500)

## 2019-11-26 NOTE — Progress Notes (Signed)
Good morning, please let Eudell know his physical labs have returned and overall are within normal range with exception of cholesterol levels which remain elevated.  I recommend continued focus on diet and regular exercise.  Will recheck next visit.  Have a wonderful day!! Keep being awesome!!  Thank you for allowing me to participate in your care. Kindest regards, Pria Klosinski

## 2019-12-20 ENCOUNTER — Other Ambulatory Visit: Payer: Self-pay | Admitting: Nurse Practitioner

## 2019-12-20 NOTE — Telephone Encounter (Signed)
Requested Prescriptions  Pending Prescriptions Disp Refills  . sertraline (ZOLOFT) 100 MG tablet [Pharmacy Med Name: SERTRALINE 100MG  TABLETS] 135 tablet 1    Sig: TAKE 1.5 TABLETS BY MOUTH DAILY     Psychiatry:  Antidepressants - SSRI Passed - 12/20/2019  9:46 AM      Passed - Completed PHQ-2 or PHQ-9 in the last 360 days      Passed - Valid encounter within last 6 months    Recent Outpatient Visits          3 weeks ago Routine general medical examination at a health care facility   [facility], Barbaraann Faster, NP   4 months ago [place], Henrine Screws T, NP   4 months ago [place], [place], [place]   7 months ago Essential hypertension, benign   [place], Murdock T, NP   8 months ago Essential hypertension, benign   [place], Barbaraann Faster, NP      Future Appointments            In 5 months [place], Barbaraann Faster, NP [facility]

## 2020-02-02 ENCOUNTER — Telehealth: Payer: Self-pay

## 2020-02-02 ENCOUNTER — Ambulatory Visit: Payer: Self-pay | Admitting: *Deleted

## 2020-02-02 NOTE — Telephone Encounter (Signed)
Computer/connection failure.    Call being switched to another nurse.

## 2020-02-02 NOTE — Telephone Encounter (Signed)
Routing to provider to advise.  

## 2020-02-02 NOTE — Telephone Encounter (Signed)
Agreed, he will need an appointment for this. If symptoms worsen before his scheduled appointment, he should go to the ED or Urgent Care to be evaluated.

## 2020-02-02 NOTE — Telephone Encounter (Signed)
Pt states his left side feels tingling and feels slightly numb pt's states it started about 3 days ago slightly getting worse. Pt unsure of what to do Pt was on hold awaiting nurse triage and due to being in the area decided to come in since his call was disconnected,scheduled for 02/07/2020 as this was the soonest apt.Pt wondering if he can be added on to something sooner or what he needs to do the patient was advised for ER or ED if SX get worse Pt denies any chest pain or other SX at the moment.

## 2020-02-03 NOTE — Telephone Encounter (Signed)
Patient notified and verbalized understanding of both providers messages. Patient states his left arm has off and on tingling and his left thumb is tingling. Advised patient to please be seen if the symptoms get worse over the weekend!

## 2020-02-06 NOTE — Progress Notes (Signed)
   SUBJECTIVE:   CHIEF COMPLAINT / HPI:   Past Medical History:  Diagnosis Date  . Hypertension   . Kidney stones   . Obesity    NUMBNESS, TINGLING  Duration: 1 week Onset: sudden Location: L arm  Bilateral: no  R handed Symmetric: no Decreased sensation: no  Weakness: no Pain: yes, occasionally Quality:  sharp Severity: mild  Frequency: intermittent Trauma: no Recent illness: no Diabetes: no Thyroid disease: no  HIV: no  Alcoholism: no  Spinal cord injury: no Alleviating factors: nothing Aggravating factors: raising arm makes worse.  Status: stable Treatments attempted: none Works in maintenance.   OBJECTIVE:   BP 133/88   Pulse 60   Temp 98.6 F (37 C)   Wt 251 lb (113.9 kg)   SpO2 96%   BMI 36.01 kg/m   Gen: well appearing, in NAD Card: 2+ radial pulses.  MSK: 5/5 UE strength, grip strength. No thenar or hypothenar atrophy. Negative empty can, Hawkins, speeds tests. No asymmetry, redness, swelling appreciated. Neuro: A&O x4. Intact to light touch to UE bilaterally. + spurlings.  ASSESSMENT/PLAN:   Numbness and tingling in left arm Acute, intermittent. Likely 2/2 cervical stenosis given +spurlings and otherwise normal MSK and neuro exam. Will obtain cspine XR. Ibuprofen as needed for pain. RTC if worsens.  Myles Gip, DO

## 2020-02-07 ENCOUNTER — Other Ambulatory Visit: Payer: Self-pay

## 2020-02-07 ENCOUNTER — Ambulatory Visit
Admission: RE | Admit: 2020-02-07 | Discharge: 2020-02-07 | Disposition: A | Discharge status: Home / Self Care | Payer: Managed Care, Other (non HMO) | Source: Ambulatory Visit | Attending: Family Medicine | Admitting: Family Medicine

## 2020-02-07 ENCOUNTER — Encounter: Payer: Self-pay | Admitting: Family Medicine

## 2020-02-07 ENCOUNTER — Ambulatory Visit
Admission: RE | Admit: 2020-02-07 | Discharge: 2020-02-07 | Disposition: A | Discharge status: Home / Self Care | Payer: Managed Care, Other (non HMO) | Attending: Family Medicine | Admitting: Family Medicine

## 2020-02-07 ENCOUNTER — Ambulatory Visit (INDEPENDENT_AMBULATORY_CARE_PROVIDER_SITE_OTHER): Payer: Managed Care, Other (non HMO) | Admitting: Family Medicine

## 2020-02-07 VITALS — BP 133/88 | HR 60 | Temp 98.6°F | Wt 251.0 lb

## 2020-02-07 DIAGNOSIS — R200 Anesthesia of skin: Secondary | ICD-10-CM | POA: Insufficient documentation

## 2020-02-07 DIAGNOSIS — R202 Paresthesia of skin: Secondary | ICD-10-CM | POA: Diagnosis present

## 2020-02-07 NOTE — Patient Instructions (Signed)
It was great to see you!  Our plans for today:  - We are getting an xray of your neck, we will call you with your results.  - Take ibuprofen as needed for pain.  - Follow up if your symptoms worsen.   Take care and seek immediate care sooner if you develop any concerns.   Dr. Ky Barban

## 2020-02-07 NOTE — Assessment & Plan Note (Signed)
Acute, intermittent. Likely 2/2 cervical stenosis given +spurlings and otherwise normal MSK and neuro exam. Will obtain cspine XR. Ibuprofen as needed for pain. RTC if worsens.

## 2020-04-03 ENCOUNTER — Other Ambulatory Visit: Payer: Self-pay | Admitting: Nurse Practitioner

## 2020-04-03 NOTE — Telephone Encounter (Signed)
Requested Prescriptions  Pending Prescriptions Disp Refills  . lisinopril (ZESTRIL) 40 MG tablet [Pharmacy Med Name: LISINOPRIL 40MG  TABLETS] 90 tablet 0    Sig: TAKE 1 TABLET(40 MG) BY MOUTH DAILY     Cardiovascular:  ACE Inhibitors Passed - 04/03/2020  3:11 AM      Passed - Cr in normal range and within 180 days    Creatinine  Date Value Ref Range Status  08/09/2012 0.96 0.60 - 1.30 mg/dL Final   Creatinine, Ser  Date Value Ref Range Status  11/25/2019 0.94 0.76 - 1.27 mg/dL Final         Passed - K in normal range and within 180 days    Potassium  Date Value Ref Range Status  11/25/2019 4.5 3.5 - 5.2 mmol/L Final  08/09/2012 4.3 3.5 - 5.1 mmol/L Final         Passed - Patient is not pregnant      Passed - Last BP in normal range    BP Readings from Last 1 Encounters:  02/07/20 133/88         Passed - Valid encounter within last 6 months    Recent Outpatient Visits          1 month ago Numbness and tingling in left arm   [place], DO   4 months ago Routine general medical examination at a health care facility   [facility], Henrine Screws T, NP   7 months ago [place], Henrine Screws T, NP   8 months ago Urticaria   [facility] [place], [place]   11 months ago Essential hypertension, benign   [place], Barbaraann Faster, NP      Future Appointments            In 1 month Cannady, Barbaraann Faster, NP [facility], [facility]

## 2020-05-25 ENCOUNTER — Ambulatory Visit: Payer: 59 | Admitting: Nurse Practitioner

## 2020-05-30 ENCOUNTER — Ambulatory Visit: Payer: Managed Care, Other (non HMO) | Admitting: Nurse Practitioner

## 2020-06-07 ENCOUNTER — Ambulatory Visit: Payer: Managed Care, Other (non HMO) | Admitting: Nurse Practitioner

## 2020-06-18 ENCOUNTER — Other Ambulatory Visit: Payer: Self-pay

## 2020-06-18 ENCOUNTER — Ambulatory Visit
Admission: RE | Admit: 2020-06-18 | Discharge: 2020-06-18 | Disposition: A | Discharge status: Home / Self Care | Payer: Managed Care, Other (non HMO) | Source: Home / Self Care | Attending: Nurse Practitioner | Admitting: Nurse Practitioner

## 2020-06-18 ENCOUNTER — Ambulatory Visit
Admission: RE | Admit: 2020-06-18 | Discharge: 2020-06-18 | Disposition: A | Discharge status: Home / Self Care | Payer: Managed Care, Other (non HMO) | Source: Ambulatory Visit | Attending: Nurse Practitioner | Admitting: Nurse Practitioner

## 2020-06-18 ENCOUNTER — Ambulatory Visit (INDEPENDENT_AMBULATORY_CARE_PROVIDER_SITE_OTHER): Payer: Managed Care, Other (non HMO) | Admitting: Nurse Practitioner

## 2020-06-18 ENCOUNTER — Encounter: Payer: Self-pay | Admitting: Nurse Practitioner

## 2020-06-18 VITALS — BP 119/77 | HR 60 | Temp 98.6°F | Wt 251.0 lb

## 2020-06-18 DIAGNOSIS — M25561 Pain in right knee: Secondary | ICD-10-CM | POA: Insufficient documentation

## 2020-06-18 DIAGNOSIS — Z6836 Body mass index (BMI) 36.0-36.9, adult: Secondary | ICD-10-CM

## 2020-06-18 DIAGNOSIS — M25842 Other specified joint disorders, left hand: Secondary | ICD-10-CM | POA: Diagnosis not present

## 2020-06-18 DIAGNOSIS — F418 Other specified anxiety disorders: Secondary | ICD-10-CM

## 2020-06-18 DIAGNOSIS — I10 Essential (primary) hypertension: Secondary | ICD-10-CM

## 2020-06-18 DIAGNOSIS — E6609 Other obesity due to excess calories: Secondary | ICD-10-CM

## 2020-06-18 DIAGNOSIS — G8929 Other chronic pain: Secondary | ICD-10-CM | POA: Insufficient documentation

## 2020-06-18 MED ORDER — SERTRALINE HCL 100 MG PO TABS: 200.0000 mg | ORAL_TABLET | Freq: Every day | ORAL | 4 refills | Status: DC

## 2020-06-18 NOTE — Assessment & Plan Note (Signed)
Chronic, ongoing. Some exacerbation in symptoms = PHQ9 = 13 today.  Increase Sertraline to 200 MG (max dose) and consider addition of Wellbutrin in future if ongoing elevation in PHQ and symptoms -- Wellbutrin may benefit motivation.  Denies SI/HI.  Return in 6 weeks.

## 2020-06-18 NOTE — Assessment & Plan Note (Signed)
Chronic, stable with BP at goal today. Continue Lisinopril 40 MG at this time and if elevations above goal in future consider addition of Amlodipine.  Recommend he continue to monitor BP at home and document + focus on DASH diet..  To notify provider if any increase and consistency in cough.  CMP & lipid panel today. Return in 6 months.

## 2020-06-18 NOTE — Patient Instructions (Signed)
Acute Knee Pain, Adult Many things can cause knee pain. Sometimes, knee pain is sudden (acute) and may be caused by damage, swelling, or irritation of the muscles and tissues that support your knee. The pain often goes away on its own with time and rest. If the pain does not go away, tests may be done to find out what is causing the pain. Follow these instructions at home: If you have a knee sleeve or brace:  Wear the knee sleeve or brace as told by your doctor. Take it off only as told by your doctor.  Loosen it if your toes: ? Tingle. ? Become numb. ? Turn cold and blue.  Keep it clean.  If the knee sleeve or brace is not waterproof: ? Do not let it get wet. ? Cover it with a watertight covering when you take a bath or shower.   Activity  Rest your knee.  Do not do things that cause pain or make pain worse.  Avoid activities where both feet leave the ground at the same time (high-impact activities). Examples are running, jumping rope, and doing jumping jacks.  Work with a physical therapist to make a safe exercise program, as told by your doctor. Managing pain, stiffness, and swelling  If told, put ice on the knee. To do this: ? If you have a removable knee sleeve or brace, take it off as told by your doctor. ? Put ice in a plastic bag. ? Place a towel between your skin and the bag. ? Leave the ice on for 20 minutes, 2-3 times a day. ? Take off the ice if your skin turns bright red. This is very important. If you cannot feel pain, heat, or cold, you have a greater risk of damage to the area.  If told, use an elastic bandage to put pressure (compression) on your injured knee.  Raise your knee above the level of your heart while you are sitting or lying down.  Sleep with a pillow under your knee.   General instructions  Take over-the-counter and prescription medicines only as told by your doctor.  Do not smoke or use any products that contain nicotine or tobacco. If you  need help quitting, ask your doctor.  If you are overweight, work with your doctor and a food expert (dietitian) to set goals to lose weight. Being overweight can make your knee hurt more.  Watch for any changes in your symptoms.  Keep all follow-up visits. Contact a doctor if:  The knee pain does not stop.  The knee pain changes or gets worse.  You have a fever along with knee pain.  Your knee is red or feels warm when you touch it.  Your knee gives out or locks up. Get help right away if:  Your knee swells, and the swelling gets worse.  You cannot move your knee.  You have very bad knee pain that does not get better with pain medicine. Summary  Many things can cause knee pain. The pain often goes away on its own with time and rest.  Your doctor may do tests to find out the cause of the pain.  Watch for any changes in your symptoms. Relieve your pain with rest, medicines, light activity, and use of ice.  Get help right away if you cannot move your knee or your knee pain is very bad. This information is not intended to replace advice given to you by your health care provider. Make sure you discuss   any questions you have with your health care provider. Document Revised: 07/20/2019 Document Reviewed: 07/20/2019 Elsevier Patient Education  2021 Elsevier Inc.  
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
## 2020-06-18 NOTE — Assessment & Plan Note (Signed)
Ongoing x one month.  Will obtain baseline imaging of right knee.  Recommend at this time trial of OTC treatment.  May take Tylenol 1000 MG TID as needed.  OTC Voltaren gel as needed.  Recommend he wear a knee support/compression while at work.  If ongoing discomfort could consider steroid injection to right knee, which we discussed today.  Return in 6 weeks for follow-up.

## 2020-06-18 NOTE — Assessment & Plan Note (Signed)
BMI 36.01.  Recommended eating smaller high protein, low fat meals more frequently and exercising 30 mins a day 5 times a week with a goal of 10-15lb weight loss in the next 3 months. Patient voiced their understanding and motivation to adhere to these recommendations.

## 2020-06-18 NOTE — Assessment & Plan Note (Signed)
Noted about one month ago with reported tenderness to area.  He would like referral to general surgery to discuss removal.  Will place referral today.

## 2020-06-19 LAB — COMPREHENSIVE METABOLIC PANEL
ALT: 13 IU/L (ref 0–44)
AST: 16 IU/L (ref 0–40)
Albumin/Globulin Ratio: 2.5 — ABNORMAL HIGH (ref 1.2–2.2)
Albumin: 4.8 g/dL (ref 4.0–5.0)
Alkaline Phosphatase: 81 IU/L (ref 44–121)
BUN/Creatinine Ratio: 12 (ref 9–20)
BUN: 12 mg/dL (ref 6–24)
Bilirubin Total: 0.2 mg/dL (ref 0.0–1.2)
CO2: 24 mmol/L (ref 20–29)
Calcium: 9 mg/dL (ref 8.7–10.2)
Chloride: 105 mmol/L (ref 96–106)
Creatinine, Ser: 1.02 mg/dL (ref 0.76–1.27)
Globulin, Total: 1.9 g/dL (ref 1.5–4.5)
Glucose: 91 mg/dL (ref 65–99)
Potassium: 4.7 mmol/L (ref 3.5–5.2)
Sodium: 142 mmol/L (ref 134–144)
Total Protein: 6.7 g/dL (ref 6.0–8.5)
eGFR: 93 mL/min/{1.73_m2} (ref >59)

## 2020-06-19 LAB — LIPID PANEL W/O CHOL/HDL RATIO
Cholesterol, Total: 207 mg/dL — ABNORMAL HIGH (ref 100–199)
HDL: 39 mg/dL — ABNORMAL LOW (ref >39)
LDL Chol Calc (NIH): 144 mg/dL — ABNORMAL HIGH (ref 0–99)
Triglycerides: 130 mg/dL (ref 0–149)
VLDL Cholesterol Cal: 24 mg/dL (ref 5–40)

## 2020-06-19 NOTE — Progress Notes (Signed)
Contacted via MyChart The 10-year ASCVD risk score [name], et al., 2013) is: 2.7%   Values used to calculate the score:     Age: 45 years     Sex: Male     Is Non-Hispanic African American: No     Diabetic: No     Tobacco smoker: No     Systolic Blood Pressure: 711 mmHg     Is BP treated: Yes     HDL Cholesterol: 39 mg/dL     Total Cholesterol: 207 mg/dL   Good afternoon Joe Gutierrez, your labs have returned.  Kidney and liver function + electrolytes remain stable.  Cholesterol levels remain elevated, but cardiac risk score is low at 2.7%.  At this time continue focus on diet and exercise regularly.  We will continue to monitor and may add on medication to help lower in future dependent on levels and risk score.  Any questions? Keep being awesome!!  Thank you for allowing me to participate in your care. Kindest regards, Yahayra Geis

## 2020-06-20 NOTE — Progress Notes (Signed)
Contacted via MyChart   Good morning Joe Gutierrez, overall knee imaging looks good.  Joint spaces are normal.  Everything is aligned.  If pain continues we could consider knee injection in office to see if benefit or a referral to physical therapy.  Any questions? Keep being awesome!!  Thank you for allowing me to participate in your care. Kindest regards, Gervis Gaba

## 2020-06-25 NOTE — Addendum Note (Signed)
Addended by: Marnee Guarneri T on: 06/25/2020 05:06 PM   Modules accepted: Orders

## 2020-06-26 ENCOUNTER — Ambulatory Visit: Payer: Self-pay | Admitting: Surgery

## 2020-07-13 ENCOUNTER — Other Ambulatory Visit: Payer: Self-pay | Admitting: Nurse Practitioner

## 2020-07-18 ENCOUNTER — Other Ambulatory Visit: Payer: Self-pay | Admitting: Nurse Practitioner

## 2020-07-18 MED ORDER — LISINOPRIL 40 MG PO TABS: ORAL_TABLET | ORAL | 0 refills | Status: DC

## 2020-07-18 NOTE — Telephone Encounter (Signed)
Medication Refill - Medication: lisinopril (ZESTRIL) 40 MG tablet    Preferred Pharmacy (with phone number or street name): [facility] [place], [place]: Please be advised that RX refills may take up to 3 business days. We ask that you follow-up with your pharmacy.

## 2020-07-20 ENCOUNTER — Other Ambulatory Visit: Payer: Self-pay | Admitting: Nurse Practitioner

## 2020-07-28 ENCOUNTER — Encounter: Payer: Self-pay | Admitting: Nurse Practitioner

## 2020-07-28 DIAGNOSIS — M720 Palmar fascial fibromatosis [Dupuytren]: Secondary | ICD-10-CM | POA: Insufficient documentation

## 2020-07-30 ENCOUNTER — Telehealth: Payer: Self-pay

## 2020-07-30 ENCOUNTER — Ambulatory Visit: Payer: Managed Care, Other (non HMO) | Admitting: Nurse Practitioner

## 2020-07-30 NOTE — Telephone Encounter (Signed)
-----   Message from Venita Lick, NP sent at 07/30/2020  9:57 AM EDT ----- Please call to see if he wants to reschedule, it is not like him to miss appointment.  Thanks.

## 2020-07-30 NOTE — Telephone Encounter (Signed)
Lvm to ask pt if he would like to reschedule

## 2020-08-01 ENCOUNTER — Ambulatory Visit (INDEPENDENT_AMBULATORY_CARE_PROVIDER_SITE_OTHER): Payer: 59 | Admitting: Nurse Practitioner

## 2020-08-01 ENCOUNTER — Other Ambulatory Visit: Payer: Self-pay

## 2020-08-01 ENCOUNTER — Encounter: Payer: Self-pay | Admitting: Nurse Practitioner

## 2020-08-01 VITALS — BP 111/75 | HR 61 | Temp 98.4°F | Wt 240.4 lb

## 2020-08-01 DIAGNOSIS — I10 Essential (primary) hypertension: Secondary | ICD-10-CM | POA: Diagnosis not present

## 2020-08-01 DIAGNOSIS — E559 Vitamin D deficiency, unspecified: Secondary | ICD-10-CM | POA: Diagnosis not present

## 2020-08-01 DIAGNOSIS — M25561 Pain in right knee: Secondary | ICD-10-CM

## 2020-08-01 DIAGNOSIS — F418 Other specified anxiety disorders: Secondary | ICD-10-CM

## 2020-08-01 DIAGNOSIS — Z6834 Body mass index (BMI) 34.0-34.9, adult: Secondary | ICD-10-CM

## 2020-08-01 DIAGNOSIS — E6609 Other obesity due to excess calories: Secondary | ICD-10-CM

## 2020-08-01 MED ORDER — BUSPIRONE HCL 5 MG PO TABS: 5.0000 mg | ORAL_TABLET | Freq: Two times a day (BID) | ORAL | 4 refills | Status: DC

## 2020-08-01 NOTE — Assessment & Plan Note (Signed)
Ongoing, stable.  Continue supplement daily and recheck next visit.

## 2020-08-01 NOTE — Assessment & Plan Note (Signed)
Chronic, stable with BP at goal today. Continue Lisinopril 40 MG at this time and if elevations above goal in future consider addition of Amlodipine.  Recommend he continue to monitor BP at home and document + focus on DASH diet.  To notify provider if any increase and consistency in cough.  CMP & lipid panel up to date. Return in 6 months.

## 2020-08-01 NOTE — Assessment & Plan Note (Signed)
BMI 34.49.  Recommended eating smaller high protein, low fat meals more frequently and exercising 30 mins a day 5 times a week with a goal of 10-15lb weight loss in the next 3 months. Patient voiced their understanding and motivation to adhere to these recommendations.

## 2020-08-01 NOTE — Patient Instructions (Signed)
Acute Knee Pain, Adult °Many things can cause knee pain. Sometimes, knee pain is sudden (acute) and may be caused by damage, swelling, or irritation of the muscles and tissues that support your knee. °The pain often goes away on its own with time and rest. If the pain does not go away, tests may be done to find out what is causing the pain. °Follow these instructions at home: °If you have a knee sleeve or brace: ° °Wear the knee sleeve or brace as told by your doctor. Take it off only as told by your doctor. °Loosen it if your toes: °Tingle. °Become numb. °Turn cold and blue. °Keep it clean. °If the knee sleeve or brace is not waterproof: °Do not let it get wet. °Cover it with a watertight covering when you take a bath or shower. °Activity °Rest your knee. °Do not do things that cause pain or make pain worse. °Avoid activities where both feet leave the ground at the same time (high-impact activities). Examples are running, jumping rope, and doing jumping jacks. °Work with a physical therapist to make a safe exercise program, as told by your doctor. °Managing pain, stiffness, and swelling ° °If told, put ice on the knee. To do this: °If you have a removable knee sleeve or brace, take it off as told by your doctor. °Put ice in a plastic bag. °Place a towel between your skin and the bag. °Leave the ice on for 20 minutes, 2-3 times a day. °Take off the ice if your skin turns bright red. This is very important. If you cannot feel pain, heat, or cold, you have a greater risk of damage to the area. °If told, use an elastic bandage to put pressure (compression) on your injured knee. °Raise your knee above the level of your heart while you are sitting or lying down. °Sleep with a pillow under your knee. °General instructions °Take over-the-counter and prescription medicines only as told by your doctor. °Do not smoke or use any products that contain nicotine or tobacco. If you need help quitting, ask your doctor. °If you are  overweight, work with your doctor and a food expert (dietitian) to set goals to lose weight. Being overweight can make your knee hurt more. °Watch for any changes in your symptoms. °Keep all follow-up visits. °Contact a doctor if: °The knee pain does not stop. °The knee pain changes or gets worse. °You have a fever along with knee pain. °Your knee is red or feels warm when you touch it. °Your knee gives out or locks up. °Get help right away if: °Your knee swells, and the swelling gets worse. °You cannot move your knee. °You have very bad knee pain that does not get better with pain medicine. °Summary °Many things can cause knee pain. The pain often goes away on its own with time and rest. °Your doctor may do tests to find out the cause of the pain. °Watch for any changes in your symptoms. Relieve your pain with rest, medicines, light activity, and use of ice. °Get help right away if you cannot move your knee or your knee pain is very bad. °This information is not intended to replace advice given to you by your health care provider. Make sure you discuss any questions you have with your health care provider. °Document Revised: 07/20/2019 Document Reviewed: 07/20/2019 °Elsevier Patient Education © 2022 [employer] Inc. ° °

## 2020-08-01 NOTE — Assessment & Plan Note (Signed)
Chronic, ongoing. Some exacerbation in symptoms = PHQ9 = 6 today (improving), but GAD 7 = 7, some worsening from previous of 4.  Continue Sertraline 200 MG (max dose) and add on Buspar 5 MG BID, consider addition of Wellbutrin in future if ongoing elevation in PHQ and symptoms -- Wellbutrin may benefit motivation.  Denies SI/HI.  Return in 8 weeks.

## 2020-08-01 NOTE — Assessment & Plan Note (Signed)
Ongoing with come improvement.  Recommend at this time continue OTC treatment.  May take Tylenol 1000 MG TID as needed.  OTC Voltaren gel as needed.  Recommend he wear a knee support/compression while at work.  If ongoing discomfort could consider steroid injection to right knee, which we discussed today.  Return in 8 weeks for follow-up.
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
## 2020-09-26 ENCOUNTER — Ambulatory Visit: Payer: 59 | Admitting: Nurse Practitioner

## 2020-10-01 ENCOUNTER — Other Ambulatory Visit: Payer: Self-pay

## 2020-10-01 ENCOUNTER — Encounter: Payer: Self-pay | Admitting: Nurse Practitioner

## 2020-10-01 ENCOUNTER — Ambulatory Visit (INDEPENDENT_AMBULATORY_CARE_PROVIDER_SITE_OTHER): Payer: 59 | Admitting: Nurse Practitioner

## 2020-10-01 VITALS — BP 101/64 | HR 62 | Temp 98.3°F | Wt 236.0 lb

## 2020-10-01 DIAGNOSIS — Z1211 Encounter for screening for malignant neoplasm of colon: Secondary | ICD-10-CM | POA: Diagnosis not present

## 2020-10-01 DIAGNOSIS — F418 Other specified anxiety disorders: Secondary | ICD-10-CM | POA: Diagnosis not present

## 2020-10-01 MED ORDER — LISINOPRIL 40 MG PO TABS: ORAL_TABLET | ORAL | 4 refills | Status: DC

## 2020-10-01 NOTE — Patient Instructions (Signed)

## 2020-10-01 NOTE — Assessment & Plan Note (Signed)
Chronic, ongoing. Improving, PHQ9 = 2 today (improving), GAD 2 = 2, improvement.  Continue Sertraline 200 MG (max dose) and Buspar 5 MG BID, consider addition of Wellbutrin in future if ongoing elevation in PHQ and symptoms -- Wellbutrin may benefit motivation.  Denies SI/HI.  Return in October for physical.

## 2020-10-01 NOTE — Progress Notes (Signed)
BP 101/64   Pulse 62   Temp 98.3 F (36.8 C) (Oral)   Wt 236 lb (107 kg)   SpO2 94%   BMI 33.86 kg/m    Subjective:    Patient ID: Joe Gutierrez, male    DOB: 07/06/1975, 46 y.o.   MRN: [id number]  HPI: Joe Gutierrez is a 45 y.o. male  Chief Complaint  Patient presents with   Mood    Patient is here to follow up on Mood    Depression   Anxiety   DEPRESSION Continues on Sertraline 200 MG daily and Buspar 5 MG BID started last visit, which is offering benefit. Mood status: stable Satisfied with current treatment?: yes Symptom severity: moderate Duration of current treatment : chronic Side effects: no Medication compliance: good compliance Psychotherapy/counseling: none Previous psychiatric medications: Paxil and Buspar Depressed mood: no Anxious mood: yes Anhedonia: no Significant weight loss or gain: no Insomnia: none Fatigue: occasional Feelings of worthlessness or guilt: occasional Impaired concentration/indecisiveness: none Suicidal ideations: no Hopelessness: no Crying spells: no  Depression screen [facility] 2/9 10/01/2020 08/01/2020 06/18/2020 11/25/2019 08/19/2019  Decreased Interest _0 0 0  Down, Depressed, Hopeless 0 1 2 0 0  PHQ - 2 Score _1 0 0  Altered sleeping 0 2 1 0 1  Tired, decreased energy 0 0 2 0 1  Change in appetite 0 1 1 0 0  Feeling bad or failure about yourself  0 0 2 0 0  Trouble concentrating _2 0 0  Moving slowly or fidgety/restless 0 0 0 0 0  Suicidal thoughts 0 0 0 0 0  PHQ-9 Score _3 0 2  Difficult doing work/chores Not difficult at all Somewhat difficult Somewhat difficult Not difficult at all -  Some recent data might be hidden   GAD 7 : Generalized Anxiety Score 10/01/2020 08/01/2020 05/02/2019 03/30/2019  Nervous, Anxious, on Edge 0 1 0 1  Control/stop worrying 0 _4 Worry too much - different things 0 1 0 1  Trouble relaxing _5 Restless 0 _6 Easily annoyed or irritable _7 Afraid - awful might  happen 0 1 0 0  Total GAD 7 Score _8 Anxiety Difficulty Not difficult at all Somewhat difficult Not difficult at all Not difficult at all     Relevant past medical, surgical, family and social history reviewed and updated as indicated. Interim medical history since our last visit reviewed. Allergies and medications reviewed and updated.  Review of Systems  Constitutional:  Negative for activity change, diaphoresis, fatigue and fever.  Respiratory:  Negative for cough, chest tightness, shortness of breath and wheezing.   Cardiovascular:  Negative for chest pain, palpitations and leg swelling.  Gastrointestinal: Negative.   Endocrine: Negative.   Psychiatric/Behavioral:  Negative for decreased concentration, self-injury, sleep disturbance and suicidal ideas. The patient is not nervous/anxious.    Per HPI unless specifically indicated above     Objective:    BP 101/64   Pulse 62   Temp 98.3 F (36.8 C) (Oral)   Wt 236 lb (107 kg)   SpO2 94%   BMI 33.86 kg/m   Wt Readings from Last 3 Encounters:  10/01/20 236 lb (107 kg)  08/01/20 240 lb 6.4 oz (109 kg)  06/18/20 251 lb (113.9 kg)    Physical Exam Vitals and nursing note reviewed.  Constitutional:  General: He is awake. He is not in acute distress.    Appearance: He is well-developed, well-groomed and overweight. He is not ill-appearing.  HENT:     Head: Normocephalic and atraumatic.     Right Ear: Hearing normal. No drainage.     Left Ear: Hearing normal. No drainage.  Eyes:     General: Lids are normal.        Right eye: No discharge.        Left eye: No discharge.     Conjunctiva/sclera: Conjunctivae normal.     Pupils: Pupils are equal, round, and reactive to light.  Neck:     Vascular: No carotid bruit.  Cardiovascular:     Rate and Rhythm: Normal rate and regular rhythm.     Heart sounds: Normal heart sounds, S1 normal and S2 normal. No murmur heard.   No gallop.  Pulmonary:     Effort: Pulmonary  effort is normal. No accessory muscle usage or respiratory distress.     Breath sounds: Normal breath sounds.  Abdominal:     General: Bowel sounds are normal.     Palpations: Abdomen is soft.  Musculoskeletal:     Cervical back: Normal range of motion and neck supple.     Right knee: Normal.     Left knee: Normal.     Right lower leg: No edema.     Left lower leg: No edema.  Skin:    General: Skin is warm and dry.  Neurological:     Mental Status: He is alert and oriented to person, place, and time.  Psychiatric:        Attention and Perception: Attention normal.        Mood and Affect: Mood normal.        Speech: Speech normal.        Behavior: Behavior normal. Behavior is cooperative.    Results for orders placed or performed in visit on 06/18/20  Comprehensive metabolic panel  Result Value Ref Range   Glucose 91 65 - 99 mg/dL   BUN 12 6 - 24 mg/dL   Creatinine, Ser 1.02 0.76 - 1.27 mg/dL   eGFR 93 >59 mL/min/1.73   BUN/Creatinine Ratio 12 9 - 20   Sodium 142 134 - 144 mmol/L   Potassium 4.7 3.5 - 5.2 mmol/L   Chloride 105 96 - 106 mmol/L   CO2 24 20 - 29 mmol/L   Calcium 9.0 8.7 - 10.2 mg/dL   Total Protein 6.7 6.0 - 8.5 g/dL   Albumin 4.8 4.0 - 5.0 g/dL   Globulin, Total 1.9 1.5 - 4.5 g/dL   Albumin/Globulin Ratio 2.5 (H) 1.2 - 2.2   Bilirubin Total 0.2 0.0 - 1.2 mg/dL   Alkaline Phosphatase 81 44 - 121 IU/L   AST 16 0 - 40 IU/L   ALT 13 0 - 44 IU/L  Lipid Panel w/o Chol/HDL Ratio  Result Value Ref Range   Cholesterol, Total 207 (H) 100 - 199 mg/dL   Triglycerides 130 0 - 149 mg/dL   HDL 39 (L) >39 mg/dL   VLDL Cholesterol Cal 24 5 - 40 mg/dL   LDL Chol Calc (NIH) 144 (H) 0 - 99 mg/dL      Assessment & Plan:   Problem List Items Addressed This Visit       Other   Depression with anxiety - Primary    Chronic, ongoing. Improving, PHQ9 = 2 today (improving), GAD 2 = 2, improvement.  Continue Sertraline  200 MG (max dose) and Buspar 5 MG BID, consider  addition of Wellbutrin in future if ongoing elevation in PHQ and symptoms -- Wellbutrin may benefit motivation.  Denies SI/HI.  Return in October for physical.      Other Visit Diagnoses     Colon cancer screening       GI referral placed.   Relevant Orders   Ambulatory referral to Gastroenterology        Follow up plan: Return in about 8 weeks (around 11/26/2020) for Annual physical.

## 2020-10-04 ENCOUNTER — Other Ambulatory Visit (INDEPENDENT_AMBULATORY_CARE_PROVIDER_SITE_OTHER): Payer: Self-pay

## 2020-10-04 DIAGNOSIS — Z1211 Encounter for screening for malignant neoplasm of colon: Secondary | ICD-10-CM

## 2020-10-04 MED ORDER — PEG 3350-KCL-NA BICARB-NACL 420 G PO SOLR: 4000.0000 mL | Freq: Once | ORAL | 0 refills | Status: AC

## 2020-10-04 NOTE — Progress Notes (Signed)
Gastroenterology Pre-Procedure Review  Request Date: 11/16/2020 Requesting Physician: Dr. Vicente Males  PATIENT REVIEW QUESTIONS: The patient responded to the following health history questions as indicated:    1. Are you having any GI issues? no 2. Do you have a personal history of Polyps? no 3. Do you have a family history of Colon Cancer or Polyps? no 4. Diabetes Mellitus? no 5. Joint replacements in the past 12 months?no 6. Major health problems in the past 3 months?no 7. Any artificial heart valves, MVP, or defibrillator?no    MEDICATIONS & ALLERGIES:    Patient reports the following regarding taking any anticoagulation/antiplatelet therapy:   Plavix, Coumadin, Eliquis, Xarelto, Lovenox, Pradaxa, Brilinta, or Effient? no Aspirin? no  Patient confirms/reports the following medications:  Current Outpatient Medications  Medication Sig Dispense Refill   busPIRone (BUSPAR) 5 MG tablet Take 1 tablet (5 mg total) by mouth 2 (two) times daily. 180 tablet 4   lisinopril (ZESTRIL) 40 MG tablet TAKE 1 TABLET(40 MG) BY MOUTH DAILY 90 tablet 4   sertraline (ZOLOFT) 100 MG tablet Take 2 tablets (200 mg total) by mouth daily. 180 tablet 4   Vitamin D, Cholecalciferol, 25 MCG (1000 UT) TABS Take 1 tablet by mouth daily.     No current facility-administered medications for this visit.    Patient confirms/reports the following allergies:  Allergies  Allergen Reactions   Codeine Hives   Sulfa Antibiotics Hives    No orders of the defined types were placed in this encounter.   AUTHORIZATION INFORMATION Primary Insurance: 1D#: Group #:  Secondary Insurance: 1D#: Group #:  SCHEDULE INFORMATION: Date: 11/16/2020 Time: Location: [place]

## 2020-10-25 ENCOUNTER — Ambulatory Visit: Admission: RE | Admit: 2020-10-25 | Payer: 59 | Source: Home / Self Care | Admitting: *Deleted

## 2020-10-26 ENCOUNTER — Encounter: Payer: Self-pay | Admitting: Nurse Practitioner

## 2020-10-26 ENCOUNTER — Other Ambulatory Visit: Payer: Self-pay

## 2020-10-26 ENCOUNTER — Ambulatory Visit (INDEPENDENT_AMBULATORY_CARE_PROVIDER_SITE_OTHER): Payer: 59 | Admitting: Nurse Practitioner

## 2020-10-26 DIAGNOSIS — M25531 Pain in right wrist: Secondary | ICD-10-CM | POA: Diagnosis not present

## 2020-10-26 MED ORDER — PREDNISONE 10 MG PO TABS: ORAL_TABLET | ORAL | 0 refills | Status: DC

## 2020-10-26 NOTE — Assessment & Plan Note (Signed)
Acute for 24 hours, suspect carpal tunnel vs tendinopathy.  At this time imaging not warranted as low suspicion for fracture, no recent trauma.  Will trial Prednisone taper, script sent. Recommend he use Voltaren gel at home to area and wear wrist splint.  Take Tylenol as needed, avoid NSAID due to HTN.  Rest wrist over weekend and apply ice every few hours for 20 minutes.  If ongoing pain consider imaging and referral to ortho.  Return in 2 weeks, sooner if worsening.

## 2020-10-26 NOTE — Patient Instructions (Signed)
May take Tylenol and Diclofenac gel over the counter + wear wrist brace.  Use ice every 3-4 hours for 20 minutes to area.  Wrist Pain, Adult There are many things that can cause wrist pain. Some common causes include: An injury to the wrist. Using the joint too much. A condition that causes too much pressure to be put on a nerve in the wrist (carpal tunnel syndrome). Wear and tear of the joints that happens as a person gets older (osteoarthritis). A condition that causes swelling and stiffness in the joints (arthritis). Sometimes, the cause of wrist pain is not known. Often, the pain goes away when you follow your doctor's instructions for easing pain at home. This may include resting your wrist, icing your wrist, or using a splint or an elastic wrap for a short time. It is important to tell your doctor if your wrist pain does not go away. Follow these instructions at home: If you have a splint or elastic wrap: Wear the splint or wrap as told by your doctor. Take it off only as told by your doctor. Ask if you can take it off for bathing. Loosen the splint or wrap if your fingers: Tingle. Become numb. Turn cold and blue. Check the skin around the splint or wrap every day. Tell your doctor about any concerns. Keep the splint or wrap clean. If the splint or wrap is not waterproof: Do not let it get wet. Cover it with a watertight covering when you take a bath or shower. Managing pain, stiffness, and swelling  If told, put ice on the painful area. To do this: If you have a removable splint or wrap, take it off as told by your doctor. Put ice in a plastic bag. Place a towel between your skin and the bag or between your splint or wrap and the bag. Leave the ice on for 20 minutes, 2-3 times a day. Move your fingers often. Raise (elevate) the injured area above the level of your heart while you are sitting or lying down. Activity Rest your wrist as told by your doctor. Return to your normal  activities as told by your doctor. Ask your doctor what activities are safe for you. Ask your doctor when it is safe to drive if you have a splint or wrap on your wrist. Do exercises as told by your doctor. General instructions Pay attention to any changes in your symptoms. Take over-the-counter and prescription medicines only as told by your doctor. Keep all follow-up visits as told by your doctor. This is important. Contact a doctor if: You have a sudden, sharp pain in the wrist, hand, or arm that is different or new. The swelling or bruising on your wrist or hand gets worse. Your skin: Becomes red. Gets a rash. Has open sores. Your pain does not get better. Your pain gets worse. You have a fever or chills. Get help right away if: You lose feeling in your fingers or hand. Your fingers turn white, very red, or cold and blue. You cannot move your fingers. Summary There are many things that can cause wrist pain. It is important to tell your doctor if your wrist pain does not go away. You may need to wear a splint or a wrap for a short period of time. Return to your normal activities as told by your doctor. Ask your doctor what activities are safe for you. This information is not intended to replace advice given to you by your health care  provider. Make sure you discuss any questions you have with your health care provider. Document Revised: 12/23/2018 Document Reviewed: 12/23/2018 Elsevier Patient Education  2022 [employer].

## 2020-10-26 NOTE — Progress Notes (Signed)
BP 120/83   Pulse (!) 56   Temp 98.3 F (36.8 C) (Oral)   Wt 238 lb 9.6 oz (108.2 kg)   SpO2 95%   BMI 34.24 kg/m    Subjective:    Patient ID: Joe Gutierrez, male    DOB: [birth year], 45 y.o.   MRN: [id number]  HPI: Joe Gutierrez is a 45 y.o. male  Chief Complaint  Patient presents with   Wrist Pain    Patient states he noticed issues with gripping things and a shooting pain all the way through his right wrist. Patient states the pain when he is not moving is fine, but when he has to move it, it is a constant pain. Patient denies applying cold/warm compress and denies trying any OTC medications.    WRIST PAIN  Started yesterday morning to right wrist, he went to work and this pain became worse with working.  Denies any recent known injuries.  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  Has history of previous injury in younger years to this wrist.  This is his dominant hand.   Duration: days Involved wrist: right Mechanism of injury:  unknown Location: medial Onset: sudden Severity: 8/10 at worst if moving, 6/10 at baseline with resting Quality:  sharp, aching, and shooting Frequency: constant Radiation:  up thumb a little bit Aggravating factors: movement and gripping  Alleviating factors: nothing  Status: fluctuating Treatments attempted: none    Relief with NSAIDs?:  No NSAIDs Taken Weakness: no Numbness: yes Median nerve distribution Redness: no Bruising: no Swelling:  a little bit Fevers: no   Relevant past medical, surgical, family and social history reviewed and updated as indicated. Interim medical history since our last visit reviewed. Allergies and medications reviewed and updated.  Review of Systems  Constitutional:  Negative for activity change, diaphoresis, fatigue and fever.  Respiratory:  Negative for cough, chest tightness, shortness of breath and wheezing.   Cardiovascular:  Negative for chest pain, palpitations and leg swelling.   Musculoskeletal:  Positive for arthralgias.  Neurological: Negative.   Psychiatric/Behavioral: Negative.     Per HPI unless specifically indicated above     Objective:    BP 120/83   Pulse (!) 56   Temp 98.3 F (36.8 C) (Oral)   Wt 238 lb 9.6 oz (108.2 kg)   SpO2 95%   BMI 34.24 kg/m   Wt Readings from Last 3 Encounters:  10/26/20 238 lb 9.6 oz (108.2 kg)  10/01/20 236 lb (107 kg)  08/01/20 240 lb 6.4 oz (109 kg)    Physical Exam Vitals and nursing note reviewed.  Constitutional:      General: He is awake. He is not in acute distress.    Appearance: He is well-developed, well-groomed and overweight. He is not ill-appearing or toxic-appearing.  HENT:     Head: Normocephalic and atraumatic.     Right Ear: Hearing normal. No drainage.     Left Ear: Hearing normal. No drainage.  Eyes:     General: Lids are normal.        Right eye: No discharge.        Left eye: No discharge.     Conjunctiva/sclera: Conjunctivae normal.     Pupils: Pupils are equal, round, and reactive to light.  Neck:     Vascular: No carotid bruit.  Cardiovascular:     Rate and Rhythm: Normal rate and regular rhythm.     Heart sounds: Normal heart sounds, S1 normal and  S2 normal. No murmur heard.   No gallop.  Pulmonary:     Effort: Pulmonary effort is normal. No accessory muscle usage or respiratory distress.     Breath sounds: Normal breath sounds.  Abdominal:     General: Bowel sounds are normal.     Palpations: Abdomen is soft.  Musculoskeletal:     Right wrist: Swelling (mild to medial aspect) and tenderness present. No deformity, lacerations, bony tenderness, snuff box tenderness or crepitus. Decreased range of motion. Normal pulse.     Left wrist: Normal.     Right hand: No swelling, deformity or tenderness. Decreased range of motion. Decreased strength of finger abduction. Normal sensation. Normal pulse.     Left hand: Normal.     Cervical back: Normal range of motion and neck supple.      Right knee: Normal.     Left knee: Normal.     Right lower leg: No edema.     Left lower leg: No edema.     Comments: Negative Tinel, positive Phalen.  Skin:    General: Skin is warm and dry.  Neurological:     Mental Status: He is alert and oriented to person, place, and time.  Psychiatric:        Attention and Perception: Attention normal.        Mood and Affect: Mood normal.        Speech: Speech normal.        Behavior: Behavior normal. Behavior is cooperative.    Results for orders placed or performed in visit on 06/18/20  Comprehensive metabolic panel  Result Value Ref Range   Glucose 91 65 - 99 mg/dL   BUN 12 6 - 24 mg/dL   Creatinine, Ser 1.02 0.76 - 1.27 mg/dL   eGFR 93 >59 mL/min/1.73   BUN/Creatinine Ratio 12 9 - 20   Sodium 142 134 - 144 mmol/L   Potassium 4.7 3.5 - 5.2 mmol/L   Chloride 105 96 - 106 mmol/L   CO2 24 20 - 29 mmol/L   Calcium 9.0 8.7 - 10.2 mg/dL   Total Protein 6.7 6.0 - 8.5 g/dL   Albumin 4.8 4.0 - 5.0 g/dL   Globulin, Total 1.9 1.5 - 4.5 g/dL   Albumin/Globulin Ratio 2.5 (H) 1.2 - 2.2   Bilirubin Total 0.2 0.0 - 1.2 mg/dL   Alkaline Phosphatase 81 44 - 121 IU/L   AST 16 0 - 40 IU/L   ALT 13 0 - 44 IU/L  Lipid Panel w/o Chol/HDL Ratio  Result Value Ref Range   Cholesterol, Total 207 (H) 100 - 199 mg/dL   Triglycerides 130 0 - 149 mg/dL   HDL 39 (L) >39 mg/dL   VLDL Cholesterol Cal 24 5 - 40 mg/dL   LDL Chol Calc (NIH) 144 (H) 0 - 99 mg/dL      Assessment & Plan:   Problem List Items Addressed This Visit       Other   Acute pain of right wrist    Acute for 24 hours, suspect carpal tunnel vs tendinopathy.  At this time imaging not warranted as low suspicion for fracture, no recent trauma.  Will trial Prednisone taper, script sent. Recommend he use Voltaren gel at home to area and wear wrist splint.  Take Tylenol as needed, avoid NSAID due to HTN.  Rest wrist over weekend and apply ice every few hours for 20 minutes.  If ongoing pain  consider imaging and referral to ortho.  Return   in 2 weeks, sooner if worsening.        Follow up plan: Return in about 2 weeks (around 11/09/2020) for Right wrist pain.

## 2020-11-16 ENCOUNTER — Ambulatory Visit: Payer: 59 | Admitting: Anesthesiology

## 2020-11-16 ENCOUNTER — Encounter
Admission: RE | Disposition: A | Discharge status: Home / Self Care | Payer: Self-pay | Source: Home / Self Care | Attending: Gastroenterology

## 2020-11-16 ENCOUNTER — Ambulatory Visit
Admission: RE | Admit: 2020-11-16 | Discharge: 2020-11-16 | Disposition: A | Discharge status: Home / Self Care | Payer: 59 | Attending: Gastroenterology | Admitting: Gastroenterology

## 2020-11-16 ENCOUNTER — Encounter: Payer: Self-pay | Admitting: Gastroenterology

## 2020-11-16 ENCOUNTER — Other Ambulatory Visit: Payer: Self-pay

## 2020-11-16 DIAGNOSIS — K5730 Diverticulosis of large intestine without perforation or abscess without bleeding: Secondary | ICD-10-CM | POA: Diagnosis not present

## 2020-11-16 DIAGNOSIS — Z882 Allergy status to sulfonamides status: Secondary | ICD-10-CM | POA: Diagnosis not present

## 2020-11-16 DIAGNOSIS — D490 Neoplasm of unspecified behavior of digestive system: Secondary | ICD-10-CM | POA: Insufficient documentation

## 2020-11-16 DIAGNOSIS — B829 Intestinal parasitism, unspecified: Secondary | ICD-10-CM | POA: Diagnosis not present

## 2020-11-16 DIAGNOSIS — Z885 Allergy status to narcotic agent status: Secondary | ICD-10-CM | POA: Insufficient documentation

## 2020-11-16 DIAGNOSIS — Z1211 Encounter for screening for malignant neoplasm of colon: Secondary | ICD-10-CM | POA: Diagnosis present

## 2020-11-16 DIAGNOSIS — Z79899 Other long term (current) drug therapy: Secondary | ICD-10-CM | POA: Diagnosis not present

## 2020-11-16 HISTORY — PX: COLONOSCOPY WITH PROPOFOL: SHX5780

## 2020-11-16 SURGERY — COLONOSCOPY WITH PROPOFOL
Anesthesia: General

## 2020-11-16 MED ORDER — PROPOFOL 10 MG/ML IV BOLUS
INTRAVENOUS | Status: AC
  Filled 2020-11-16: qty 20

## 2020-11-16 MED ORDER — SODIUM CHLORIDE 0.9 % IV SOLN: INTRAVENOUS | Status: DC

## 2020-11-16 MED ORDER — PROPOFOL 500 MG/50ML IV EMUL
INTRAVENOUS | Status: DC | PRN
  Administered 2020-11-16: 175 ug/kg/min via INTRAVENOUS

## 2020-11-16 MED ORDER — PROPOFOL 500 MG/50ML IV EMUL
INTRAVENOUS | Status: AC
  Filled 2020-11-16: qty 50

## 2020-11-16 MED ORDER — PROPOFOL 10 MG/ML IV BOLUS
INTRAVENOUS | Status: DC | PRN
  Administered 2020-11-16: 80 mg via INTRAVENOUS
  Administered 2020-11-16: 20 mg via INTRAVENOUS

## 2020-11-16 NOTE — Op Note (Signed)
[facility] Patient Name: Joe Gutierrez Procedure Date: 11/16/2020 8:10 AM MRN: [id number] Account #: [id number] Date of Birth: [birth year] Admit Type: Outpatient Age: 45 Room: [facility] ENDO ROOM 4 Gender: Male Note Status: Finalized Instrument Name: Park Meo [id number] Procedure:             Colonoscopy Indications:           Screening for colorectal malignant neoplasm Providers:             [name] MD, MD Referring MD:          Barbaraann Faster. Ned Card (Referring MD) Medicines:             Monitored Anesthesia Care Complications:         No immediate complications. Procedure:             Pre-Anesthesia Assessment:                        - Prior to the procedure, a History and Physical was                         performed, and patient medications, allergies and                         sensitivities were reviewed. The patient's tolerance                         of previous anesthesia was reviewed.                        - The risks and benefits of the procedure and the                         sedation options and risks were discussed with the                         patient. All questions were answered and informed                         consent was obtained.                        - ASA Grade Assessment: II - A patient with mild                         systemic disease.                        After obtaining informed consent, the colonoscope was                         passed under direct vision. Throughout the procedure,                         the patient's blood pressure, pulse, and oxygen                         saturations were monitored continuously. The                         Colonoscope was introduced  through the anus and                         advanced to the the cecum, identified by the                         appendiceal orifice. The colonoscopy was performed                         with ease. The patient tolerated the procedure well.                          The quality of the bowel preparation was adequate. Findings:      The perianal and digital rectal examinations were normal.      Parasites were found in the cecum. Fluid aspiration for ova and       parasites was performed.      A large polypoid lesion was found at the appendiceal orifice. The lesion       was semi-pedunculated. No bleeding was present. Negative pillow sign,       the lesion was anchored at two ends to the wall of the cecum , felt firm      Multiple small and large-mouthed diverticula were found in the entire       colon.      The exam was otherwise without abnormality on direct and retroflexion       views. Impression:            - Intestinal parasites.                        - [Morphology] polypoid lesion at the appendiceal                         orifice.                        - Diverticulosis in the entire examined colon.                        - The examination was otherwise normal on direct and                         retroflexion views. Recommendation:        - Discharge patient to home (with escort).                        - Resume previous diet.                        - Continue present medications.                        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 Procedure Code(s):     --- Professional ---                        [phone], Colonoscopy, flexible; diagnostic, including                         collection of specimen(s) by brushing or washing, when                         performed (separate procedure) Diagnosis Code(s):     --- Professional ---                        Z12.11, Encounter for screening for malignant neoplasm                         of colon                         B82.9, Intestinal parasitism, unspecified                        D49.0, Neoplasm of unspecified behavior of digestive                         system                        K57.30, Diverticulosis of large intestine without                         perforation or abscess without bleeding CPT copyright 2019 American Medical Association. All rights reserved. The codes documented in this report are preliminary and upon coder review may  be revised to meet current compliance requirements. [name], MD [name] MD, MD 11/16/2020 9:03:40 AM This report has been signed electronically. Number of Addenda: 0 Note Initiated On: 11/16/2020 8:10 AM Scope Withdrawal Time: 0 hours 20 minutes 47 seconds  Total Procedure Duration: 0 hours 24 minutes 56 seconds  Estimated Blood Loss:  Estimated blood loss: none.      [facility]

## 2020-11-16 NOTE — Transfer of Care (Signed)
Immediate Anesthesia Transfer of Care Note  Patient: Joe Gutierrez  Procedure(s) Performed: COLONOSCOPY WITH PROPOFOL  Patient Location: PACU  Anesthesia Type:General  Level of Consciousness: awake and alert   Airway & Oxygen Therapy: Patient Spontanous Breathing and Patient connected to nasal cannula oxygen  Post-op Assessment: Report given to RN and Post -op Vital signs reviewed and stable  Post vital signs: Reviewed and stable  Last Vitals:  Vitals Value Taken Time  BP 96/61 11/16/20 0901  Temp    Pulse 63 11/16/20 0901  Resp 16 11/16/20 0901  SpO2 93 % 11/16/20 0901  Vitals shown include unvalidated device data.  Last Pain:  Vitals:   11/16/20 0810  TempSrc: Temporal  PainSc: Asleep         Complications: No notable events documented.

## 2020-11-16 NOTE — Anesthesia Postprocedure Evaluation (Signed)
Anesthesia Post Note  Patient: Joe Gutierrez  Procedure(s) Performed: COLONOSCOPY WITH PROPOFOL  Patient location during evaluation: Endoscopy Anesthesia Type: General Level of consciousness: awake and alert Pain management: pain level controlled Vital Signs Assessment: post-procedure vital signs reviewed and stable Respiratory status: spontaneous breathing, nonlabored ventilation and respiratory function stable Cardiovascular status: blood pressure returned to baseline and stable Postop Assessment: no apparent nausea or vomiting Anesthetic complications: no   No notable events documented.   Last Vitals:  Vitals:   11/16/20 0911 11/16/20 0921  BP: 99/63 113/75  Pulse: (!) 50 (!) 52  Resp: 15 15  Temp:    SpO2: 95% 98%    Last Pain:  Vitals:   11/16/20 0921  TempSrc:   PainSc: 0-No pain                 Iran Ouch

## 2020-11-16 NOTE — Anesthesia Preprocedure Evaluation (Addendum)
Anesthesia Evaluation  Patient identified by MRN, date of birth, ID band Patient awake    Reviewed: Allergy & Precautions, NPO status , Patient's Chart, lab work & pertinent test results  Airway Mallampati: II  TM Distance: >3 FB Neck ROM: full    Dental no notable dental hx.    Pulmonary sleep apnea ,    Pulmonary exam normal        Cardiovascular Exercise Tolerance: Good hypertension, Pt. on medications Normal cardiovascular exam     Neuro/Psych Anxiety negative neurological ROS     GI/Hepatic negative GI ROS, Neg liver ROS,   Endo/Other  negative endocrine ROS  Renal/GU negative Renal ROS  negative genitourinary   Musculoskeletal   Abdominal (+) + obese,   Peds  Hematology negative hematology ROS (+)   Anesthesia Other Findings Past Medical History: No date: Hypertension No date: Kidney stones No date: Obesity  Past Surgical History: No date: APPENDECTOMY No date: LITHOTRIPSY     Reproductive/Obstetrics negative OB ROS                            Anesthesia Physical Anesthesia Plan  ASA: 2  Anesthesia Plan: General   Post-op Pain Management:    Induction:   PONV Risk Score and Plan: 1 and 2 and Propofol infusion, TIVA and Treatment may vary due to age or medical condition  Airway Management Planned: Nasal Cannula and Natural Airway  Additional Equipment:   Intra-op Plan:   Post-operative Plan:   Informed Consent: I have reviewed the patients History and Physical, chart, labs and discussed the procedure including the risks, benefits and alternatives for the proposed anesthesia with the patient or authorized representative who has indicated his/her understanding and acceptance.     Dental Advisory Given  Plan Discussed with: Anesthesiologist, CRNA and Surgeon  Anesthesia Plan Comments:         Anesthesia Quick Evaluation

## 2020-11-16 NOTE — H&P (Signed)
Joe Bellows, MD [address], [place], [place], [place], [address] [address], [place], [place], [place], [address] Phone: [phone]  Fax: [phone]  Primary Care Physician:  Venita Lick, NP   Pre-Procedure History & Physical: HPI:  Joe Gutierrez is a 45 y.o. male is here for an colonoscopy.   Past Medical History:  Diagnosis Date   Hypertension    Kidney stones    Obesity     Past Surgical History:  Procedure Laterality Date   APPENDECTOMY     LITHOTRIPSY      Prior to Admission medications   Medication Sig Start Date End Date Taking? Authorizing Provider  busPIRone (BUSPAR) 5 MG tablet Take 1 tablet (5 mg total) by mouth 2 (two) times daily. 08/01/20  Yes Cannady, Jolene T, NP  lisinopril (ZESTRIL) 40 MG tablet TAKE 1 TABLET(40 MG) BY MOUTH DAILY 10/01/20  Yes Cannady, Jolene T, NP  sertraline (ZOLOFT) 100 MG tablet Take 2 tablets (200 mg total) by mouth daily. 06/18/20  Yes Cannady, Henrine Screws T, NP  Vitamin D, Cholecalciferol, 25 MCG (1000 UT) TABS Take 1 tablet by mouth daily.   Yes [provider]  predniSONE (DELTASONE) 10 MG tablet Take 6 tablets by mouth daily for 2 days, then reduce by 1 tablet every 2 days until gone Patient not taking: Reported on 11/16/2020 10/26/20   Marnee Guarneri T, NP    Allergies as of 10/04/2020 - Review Complete 10/04/2020  Allergen Reaction Noted   Codeine Hives 01/24/2015   Sulfa antibiotics Hives 01/24/2015    Family History  Problem Relation Age of Onset   Diabetes Mother    Hypertension Mother    Alcohol abuse Father    Heart disease Father    Hypertension Father    Diabetes Maternal Grandmother    Stroke Maternal Grandfather    Hypertension Brother    Asthma Daughter    Heart disease Paternal Grandfather     Social History   Socioeconomic History   Marital status: Married    Spouse name: Not on file   Number of children: Not on file   Years of education: Not on file   Highest education  level: Not on file  Occupational History   Not on file  Tobacco Use   Smoking status: Never   Smokeless tobacco: Never  Vaping Use   Vaping Use: Never used  Substance and Sexual Activity   Alcohol use: No   Drug use: No   Sexual activity: Yes  Other Topics Concern   Not on file  Social History Narrative   Not on file   Social Determinants of Health   Financial Resource Strain: Not on file  Food Insecurity: Not on file  Transportation Needs: Not on file  Physical Activity: Not on file  Stress: Not on file  Social Connections: Not on file  Intimate Partner Violence: Not on file    Review of Systems: See HPI, otherwise negative ROS  Physical Exam: There were no vitals taken for this visit. General:   Alert,  pleasant and cooperative in NAD Head:  Normocephalic and atraumatic. Neck:  Supple; no masses or thyromegaly. Lungs:  Clear throughout to auscultation, normal respiratory effort.    Heart:  +S1, +S2, Regular rate and rhythm, No edema. Abdomen:  Soft, nontender and nondistended. Normal bowel sounds, without guarding, and without rebound.   Neurologic:  Alert and  oriented x4;  grossly normal neurologically.  Impression/Plan: [name] is  here for an colonoscopy to be performed for Screening colonoscopy average risk   Risks, benefits, limitations, and alternatives regarding  colonoscopy have been reviewed with the patient.  Questions have been answered.  All parties agreeable.   Joe Bellows, MD  11/16/2020, 8:07 AM

## 2020-11-19 ENCOUNTER — Encounter: Payer: Self-pay | Admitting: Gastroenterology

## 2020-11-21 NOTE — Telephone Encounter (Signed)
Joe Gutierrez  A mass like lesion was seen in the cecum.  Firm to hard in consistency.  It needs further evaluation at either [facility] or [facility] with endoscopic ultrasound and plan for resection.  Can you make the referral please  C/c Venita Lick, NP (FYI)   Dr [name] MD,MRCP [facility]) Gastroenterology/Hepatology Pager: [phone]

## 2020-11-21 NOTE — Telephone Encounter (Signed)
Noted  

## 2020-11-24 ENCOUNTER — Encounter: Payer: Self-pay | Admitting: Nurse Practitioner

## 2020-11-24 DIAGNOSIS — E7800 Pure hypercholesterolemia, unspecified: Secondary | ICD-10-CM | POA: Insufficient documentation

## 2020-11-26 ENCOUNTER — Encounter: Payer: Self-pay | Admitting: Nurse Practitioner

## 2020-11-26 ENCOUNTER — Ambulatory Visit: Payer: 59 | Admitting: Nurse Practitioner

## 2020-11-26 ENCOUNTER — Ambulatory Visit (INDEPENDENT_AMBULATORY_CARE_PROVIDER_SITE_OTHER): Payer: 59 | Admitting: Nurse Practitioner

## 2020-11-26 ENCOUNTER — Telehealth: Payer: Self-pay

## 2020-11-26 ENCOUNTER — Other Ambulatory Visit: Payer: Self-pay

## 2020-11-26 VITALS — BP 132/86 | HR 60 | Temp 98.5°F | Resp 18 | Ht 69.0 in | Wt 240.0 lb

## 2020-11-26 DIAGNOSIS — R933 Abnormal findings on diagnostic imaging of other parts of digestive tract: Secondary | ICD-10-CM | POA: Insufficient documentation

## 2020-11-26 DIAGNOSIS — Z0000 Encounter for general adult medical examination without abnormal findings: Secondary | ICD-10-CM

## 2020-11-26 DIAGNOSIS — N529 Male erectile dysfunction, unspecified: Secondary | ICD-10-CM | POA: Insufficient documentation

## 2020-11-26 DIAGNOSIS — E7800 Pure hypercholesterolemia, unspecified: Secondary | ICD-10-CM | POA: Diagnosis not present

## 2020-11-26 DIAGNOSIS — Z23 Encounter for immunization: Secondary | ICD-10-CM | POA: Diagnosis not present

## 2020-11-26 DIAGNOSIS — G4733 Obstructive sleep apnea (adult) (pediatric): Secondary | ICD-10-CM

## 2020-11-26 DIAGNOSIS — Z114 Encounter for screening for human immunodeficiency virus [HIV]: Secondary | ICD-10-CM | POA: Diagnosis not present

## 2020-11-26 DIAGNOSIS — I10 Essential (primary) hypertension: Secondary | ICD-10-CM

## 2020-11-26 DIAGNOSIS — Z6835 Body mass index (BMI) 35.0-35.9, adult: Secondary | ICD-10-CM

## 2020-11-26 DIAGNOSIS — F418 Other specified anxiety disorders: Secondary | ICD-10-CM

## 2020-11-26 DIAGNOSIS — Z1159 Encounter for screening for other viral diseases: Secondary | ICD-10-CM | POA: Diagnosis not present

## 2020-11-26 DIAGNOSIS — E559 Vitamin D deficiency, unspecified: Secondary | ICD-10-CM

## 2020-11-26 DIAGNOSIS — N400 Enlarged prostate without lower urinary tract symptoms: Secondary | ICD-10-CM | POA: Diagnosis not present

## 2020-11-26 DIAGNOSIS — R7301 Impaired fasting glucose: Secondary | ICD-10-CM

## 2020-11-26 DIAGNOSIS — E6609 Other obesity due to excess calories: Secondary | ICD-10-CM

## 2020-11-26 DIAGNOSIS — N522 Drug-induced erectile dysfunction: Secondary | ICD-10-CM

## 2020-11-26 MED ORDER — SILDENAFIL CITRATE 50 MG PO TABS: 50.0000 mg | ORAL_TABLET | Freq: Every day | ORAL | 0 refills | Status: DC | PRN

## 2020-11-26 NOTE — Assessment & Plan Note (Signed)
History of enlarged prostate on exams, check PSA today.  No current symptoms.

## 2020-11-26 NOTE — Assessment & Plan Note (Signed)
Check CMP today, last A1C was 5.2%.  Discussed monitoring food intake high in sugar and carbohydrates.

## 2020-11-26 NOTE — Assessment & Plan Note (Signed)
Chronic, stable with BP at goal today on recheck. Continue Lisinopril 40 MG at this time and if elevations above goal in future consider addition of Amlodipine.  Recommend he continue to monitor BP at home and document + focus on DASH diet.  To notify provider if any dry cough presents.  LABS: CMP, TSH, CBC today.  Return in 6 months.

## 2020-11-26 NOTE — Progress Notes (Signed)
Eus

## 2020-11-26 NOTE — Assessment & Plan Note (Signed)
Chronic, ongoing.  PHQ9 = 2 today. Continue Sertraline to 200 MG (max dose) and consider addition of Wellbutrin in future if ongoing symptoms -- Wellbutrin may benefit motivation.  Denies SI/HI.  Return in 6 months.

## 2020-11-26 NOTE — Assessment & Plan Note (Signed)
Ongoing, stable.  Continue supplement daily and recheck level today.

## 2020-11-26 NOTE — Progress Notes (Signed)
BP 132/86 (BP Location: Left Arm)   Pulse 60   Temp 98.5 F (36.9 C)   Resp 18   Ht _0  (1.753 m)   Wt 240 lb (108.9 kg)   SpO2 95%   BMI 35.44 kg/m    Subjective:    Patient ID: Joe Gutierrez, male    DOB: [birth year], 45 y.o.   MRN: [id number]  HPI: Joe Gutierrez is a 45 y.o. male presenting on 11/26/2020 for comprehensive medical examination. Current medical complaints include:none  He currently lives with: wife and children Interim Problems from his last visit: no   Recent colonoscopy screening did note a mass like lesion to the cecum -- per Dr. Vicente Males a referral was placed to [facility] or [facility] for further evaluation + noted parasites and awaiting pathology for treatment.  HYPERTENSION Continues on Lisinopril 40 MG daily -- has not taken medication this morning.  No current statin. Hypertension status: stable  Satisfied with current treatment? yes Duration of hypertension: chronic BP monitoring frequency: not checking BP range: BP medication side effects:  no Medication compliance: good compliance Aspirin: no Recurrent headaches: no Visual changes: no Palpitations: no Dyspnea: no Chest pain: no Lower extremity edema: no Dizzy/lightheaded: no  The 10-year ASCVD risk score (Arnett DK, et al., 2019) is: 3.6%   Values used to calculate the score:     Age: 45 years     Sex: Male     Is Non-Hispanic African American: No     Diabetic: No     Tobacco smoker: No     Systolic Blood Pressure: 916 mmHg     Is BP treated: Yes     HDL Cholesterol: 39 mg/dL     Total Cholesterol: 207 mg/dL  DEPRESSION Continues on Sertraline 200 MG daily + Buspar 5 BID.  Takes Vitamin D for history low levels.  Last level 32.1.  Does endorse current issues over past 6 months with erectile dysfunction, not able to maintain erection.  He would like testosterone levels checked. Mood status: stable Satisfied with current treatment?: yes Symptom severity: mild  Duration of current treatment  : chronic Side effects: no Medication compliance: good compliance Psychotherapy/counseling: none Previous psychiatric medications: Paxil and Buspar Depressed mood: no Anxious mood: yes Anhedonia: no Significant weight loss or gain: no Insomnia: none Fatigue: no Feelings of worthlessness or guilt: no Impaired concentration/indecisiveness: no Suicidal ideations: no Hopelessness: no Crying spells: no  Depression screen [facility] 2/9 11/26/2020 10/01/2020 08/01/2020 06/18/2020 11/25/2019  Decreased Interest 0 _1 0  Down, Depressed, Hopeless 0 0 1 2 0  PHQ - 2 Score 0 _2 0  Altered sleeping 0 0 2 1 0  Tired, decreased energy 1 0 0 2 0  Change in appetite 0 0 1 1 0  Feeling bad or failure about yourself  0 0 0 2 0  Trouble concentrating _3 0  Moving slowly or fidgety/restless 0 0 0 0 0  Suicidal thoughts 0 0 0 0 0  PHQ-9 Score _4 0  Difficult doing work/chores Not difficult at all Not difficult at all Somewhat difficult Somewhat difficult Not difficult at all  Some recent data might be hidden    IFG:  Recent labs with glucose 100 to 105 -- A1C last year 5.2%.  Hypoglycemic episodes:no Polydipsia/polyuria: no Visual disturbance: no Chest pain: no Paresthesias: no  SLEEP APNEA Went for study in past, but never picked up machine due to  cost -- over $2000. Sleep apnea status: stable Duration: chronic Satisfied with current treatment?:  no CPAP use:  no Sleep quality with CPAP use: poor Treament compliance:poor compliance Last sleep study:  Treatments attempted:  Wakes feeling refreshed:  no Daytime hypersomnolence:  none Fatigue:  none Insomnia:  none Good sleep hygiene:  yes Difficulty falling asleep:  none Difficulty staying asleep:  none Snoring bothers bed partner:  none Observed apnea by bed partner: none Obesity:  yes Hypertension: yes  Pulmonary hypertension:  no Coronary artery disease:  no  Functional Status Survey: Is the patient deaf or have  difficulty hearing?: No Does the patient have difficulty seeing, even when wearing glasses/contacts?: No Does the patient have difficulty concentrating, remembering, or making decisions?: No Does the patient have difficulty walking or climbing stairs?: No Does the patient have difficulty dressing or bathing?: No Does the patient have difficulty doing errands alone such as visiting a doctor's office or shopping?: No  FALL RISK: Fall Risk  11/26/2020 11/25/2019 09/21/2017 10/10/2015  Falls in the past year? 0 0 No No  Number falls in past yr: 0 0 - -  Injury with Fall? 0 0 - -  Risk for fall due to : No Fall Risks No Fall Risks - -  Follow up Education provided Falls evaluation completed - -   Advanced Directives <no information>  Past Medical History:  Past Medical History:  Diagnosis Date   Hypertension    Kidney stones    Obesity     Surgical History:  Past Surgical History:  Procedure Laterality Date   APPENDECTOMY     COLONOSCOPY WITH PROPOFOL N/A 11/16/2020   Procedure: COLONOSCOPY WITH PROPOFOL;  Surgeon: [name], MD;  Location: [facility] ENDOSCOPY;  Service: Gastroenterology;  Laterality: N/A;   LITHOTRIPSY      Medications:  Current Outpatient Medications on File Prior to Visit  Medication Sig   busPIRone (BUSPAR) 5 MG tablet Take 1 tablet (5 mg total) by mouth 2 (two) times daily.   lisinopril (ZESTRIL) 40 MG tablet TAKE 1 TABLET(40 MG) BY MOUTH DAILY   sertraline (ZOLOFT) 100 MG tablet Take 2 tablets (200 mg total) by mouth daily.   Vitamin D, Cholecalciferol, 25 MCG (1000 UT) TABS Take 1 tablet by mouth daily.   No current facility-administered medications on file prior to visit.    Allergies:  Allergies  Allergen Reactions   Codeine Hives   Sulfa Antibiotics Hives    Social History:  Social History   Socioeconomic History   Marital status: Married    Spouse name: Not on file   Number of children: Not on file   Years of education: Not on file   Highest  education level: Not on file  Occupational History   Not on file  Tobacco Use   Smoking status: Never   Smokeless tobacco: Never  Vaping Use   Vaping Use: Never used  Substance and Sexual Activity   Alcohol use: No   Drug use: No   Sexual activity: Yes  Other Topics Concern   Not on file  Social History Narrative   Not on file   Social Determinants of Health   Financial Resource Strain: Low Risk    Difficulty of Paying Living Expenses: Not hard at all  Food Insecurity: No Food Insecurity   Worried About Charity fundraiser in the Last Year: Never true   [place] in the Last Year: Never true  Transportation Needs: No Transportation Needs  Lack of Transportation (Medical): No   Lack of Transportation (Non-Medical): No  Physical Activity: Sufficiently Active   Days of Exercise per Week: 5 days   Minutes of Exercise per Session: 30 min  Stress: Stress Concern Present   Feeling of Stress : To some extent  Social Connections: Moderately Integrated   Frequency of Communication with Friends and Family: Three times a week   Frequency of Social Gatherings with Friends and Family: Three times a week   Attends Religious Services: More than 4 times per year   Active Member of Clubs or Organizations: No   Attends Archivist Meetings: Never   Marital Status: Married  Human resources officer Violence: Not At Risk   Fear of Current or Ex-Partner: No   Emotionally Abused: No   Physically Abused: No   Sexually Abused: No   Social History   Tobacco Use  Smoking Status Never  Smokeless Tobacco Never   Social History   Substance and Sexual Activity  Alcohol Use No    Family History:  Family History  Problem Relation Age of Onset   Diabetes Mother    Hypertension Mother    Alcohol abuse Father    Heart disease Father    Hypertension Father    Diabetes Maternal Grandmother    Stroke Maternal Grandfather    Hypertension Brother    Asthma Daughter    Heart  disease Paternal Grandfather     Past medical history, surgical history, medications, allergies, family history and social history reviewed with patient today and changes made to appropriate areas of the chart.   Review of Systems - negative All other ROS negative except what is listed above and in the HPI.      Objective:    BP 132/86 (BP Location: Left Arm)   Pulse 60   Temp 98.5 F (36.9 C)   Resp 18   Ht _0  (1.753 m)   Wt 240 lb (108.9 kg)   SpO2 95%   BMI 35.44 kg/m   Wt Readings from Last 3 Encounters:  11/26/20 240 lb (108.9 kg)  11/16/20 236 lb (107 kg)  10/26/20 238 lb 9.6 oz (108.2 kg)    Physical Exam Constitutional:      General: He is awake. He is not in acute distress.    Appearance: He is well-developed and well-groomed. He is obese. He is not ill-appearing or toxic-appearing.  HENT:     Head: Normocephalic.     Right Ear: Hearing, tympanic membrane, ear canal and external ear normal.     Left Ear: Hearing, tympanic membrane, ear canal and external ear normal.     Nose: Nose normal.     Mouth/Throat:     Mouth: Mucous membranes are moist.     Pharynx: Oropharynx is clear.  Eyes:     General: Lids are normal. No scleral icterus.    Extraocular Movements: Extraocular movements intact.     Conjunctiva/sclera: Conjunctivae normal.     Pupils: Pupils are equal, round, and reactive to light.     Visual Fields: Right eye visual fields normal and left eye visual fields normal.  Neck:     Thyroid: No thyromegaly.     Vascular: No carotid bruit.     Trachea: Trachea normal.  Cardiovascular:     Rate and Rhythm: Normal rate and regular rhythm.     Heart sounds: No murmur heard. Pulmonary:     Effort: No accessory muscle usage or respiratory distress.  Abdominal:  General: Bowel sounds are normal. There is no distension.     Palpations: Abdomen is soft.     Tenderness: There is no abdominal tenderness.  Musculoskeletal:     Cervical back: Full  passive range of motion without pain and normal range of motion.     Right lower leg: No edema.     Left lower leg: No edema.  Lymphadenopathy:     Head:     Right side of head: No submental, submandibular, tonsillar, preauricular or posterior auricular adenopathy.     Left side of head: No submental, submandibular, tonsillar, preauricular or posterior auricular adenopathy.     Cervical: No cervical adenopathy.     Right cervical: No superficial or posterior cervical adenopathy.    Left cervical: No superficial or posterior cervical adenopathy.  Skin:    General: Skin is warm.     Capillary Refill: Capillary refill takes less than 2 seconds.     Findings: No rash.  Neurological:     Mental Status: He is alert.     Cranial Nerves: Cranial nerves are intact.     Motor: Motor function is intact.     Coordination: Coordination is intact.     Gait: Gait is intact.     Deep Tendon Reflexes: Reflexes are normal and symmetric.     Reflex Scores:      Brachioradialis reflexes are 2+ on the right side and 2+ on the left side.      Patellar reflexes are 2+ on the right side and 2+ on the left side. Psychiatric:        Attention and Perception: Attention normal.        Mood and Affect: Mood normal.        Speech: Speech normal.        Behavior: Behavior normal. Behavior is cooperative.        Thought Content: Thought content normal.     Results for orders placed or performed in visit on 06/18/20  Comprehensive metabolic panel  Result Value Ref Range   Glucose 91 65 - 99 mg/dL   BUN 12 6 - 24 mg/dL   Creatinine, Ser 1.02 0.76 - 1.27 mg/dL   eGFR 93 >59 mL/min/1.73   BUN/Creatinine Ratio 12 9 - 20   Sodium 142 134 - 144 mmol/L   Potassium 4.7 3.5 - 5.2 mmol/L   Chloride 105 96 - 106 mmol/L   CO2 24 20 - 29 mmol/L   Calcium 9.0 8.7 - 10.2 mg/dL   Total Protein 6.7 6.0 - 8.5 g/dL   Albumin 4.8 4.0 - 5.0 g/dL   Globulin, Total 1.9 1.5 - 4.5 g/dL   Albumin/Globulin Ratio 2.5 (H) 1.2 -  2.2   Bilirubin Total 0.2 0.0 - 1.2 mg/dL   Alkaline Phosphatase 81 44 - 121 IU/L   AST 16 0 - 40 IU/L   ALT 13 0 - 44 IU/L  Lipid Panel w/o Chol/HDL Ratio  Result Value Ref Range   Cholesterol, Total 207 (H) 100 - 199 mg/dL   Triglycerides 130 0 - 149 mg/dL   HDL 39 (L) >39 mg/dL   VLDL Cholesterol Cal 24 5 - 40 mg/dL   LDL Chol Calc (NIH) 144 (H) 0 - 99 mg/dL      Assessment & Plan:   Problem List Items Addressed This Visit       Cardiovascular and Mediastinum   Essential hypertension, benign    Chronic, stable with BP at goal today on  recheck. Continue Lisinopril 40 MG at this time and if elevations above goal in future consider addition of Amlodipine.  Recommend he continue to monitor BP at home and document + focus on DASH diet.  To notify provider if any dry cough presents.  LABS: CMP, TSH, CBC today.  Return in 6 months.      Relevant Medications   sildenafil (VIAGRA) 50 MG tablet   Other Relevant Orders   CBC with Differential/Platelet   Comprehensive metabolic panel   TSH     Respiratory   Sleep apnea    Consider repeat sleep study in future, at this time continue side sleeping as instructed.        Endocrine   IFG (impaired fasting glucose)    Check CMP today, last A1C was 5.2%.  Discussed monitoring food intake high in sugar and carbohydrates.      Relevant Orders   Comprehensive metabolic panel     Genitourinary   Benign prostatic hyperplasia without lower urinary tract symptoms    History of enlarged prostate on exams, check PSA today.  No current symptoms.      Relevant Orders   PSA   Testosterone, free, total([facility])     Other   Depression with anxiety - Primary    Chronic, ongoing.  PHQ9 = 2 today. Continue Sertraline to 200 MG (max dose) and consider addition of Wellbutrin in future if ongoing symptoms -- Wellbutrin may benefit motivation.  Denies SI/HI.  Return in 6 months.      Obesity    BMI 35.44.  Recommended eating smaller  high protein, low fat meals more frequently and exercising 30 mins a day 5 times a week with a goal of 10-15lb weight loss in the next 3 months. Patient voiced their understanding and motivation to adhere to these recommendations.       Vitamin D deficiency    Ongoing, stable.  Continue supplement daily and recheck level today.      Relevant Orders   VITAMIN D 25 Hydroxy (Vit-D Deficiency, Fractures)   Elevated low density lipoprotein (LDL) cholesterol level    Noted on past labs, check lipid panel today and CMP.  ASCVD 3.6%, diet focus at this time.      Relevant Orders   Comprehensive metabolic panel   Lipid Panel w/o Chol/HDL Ratio   Erectile dysfunction    New over past 6 months.  Will check morning testosterone today -- has taken no medications this morning.  Start Viagra as needed and educated him on this.      Abnormal colonoscopy    Awaiting visit with [facility] or [facility] for further recommendations + awaiting pathology on parasite for tx by GI.      Other Visit Diagnoses     Need for hepatitis C screening test       Hep C screening today per guideline recommendations, discussed with patient.   Relevant Orders   Hepatitis C antibody   Encounter for screening for HIV       HIV screening today per guideline recommendations, discussed with patient.   Relevant Orders   HIV Antibody (routine testing w rflx)   Flu vaccine need       Flu vaccine today.   Relevant Orders   Flu Vaccine QUAD 6+ mos PF IM (Fluarix Quad PF) (Completed)   Encounter for annual physical exam       Annual labs today and health maintenance today.       Discussed aspirin prophylaxis  for myocardial infarction prevention and decision was it was not indicated  LABORATORY TESTING:  Health maintenance labs ordered today as discussed above.   The natural history of prostate cancer and ongoing controversy regarding screening and potential treatment outcomes of prostate cancer has been discussed with the  patient. The meaning of a false positive PSA and a false negative PSA has been discussed. He indicates understanding of the limitations of this screening test and wishes to proceed with screening PSA testing.   IMMUNIZATIONS:   - Tdap: Tetanus vaccination status reviewed: last tetanus booster within 10 years. - Influenza: Up to date -- provided today - Pneumovax: Not applicable - Prevnar: Not applicable - Zostavax vaccine: Not applicable  SCREENING: - Colonoscopy: Up To Date Discussed with patient purpose of the colonoscopy is to detect colon cancer at curable precancerous or early stages   - AAA Screening: Not applicable  -Hearing Test: Not applicable  -Spirometry: Not applicable   PATIENT COUNSELING:    Sexuality: Discussed sexually transmitted diseases, partner selection, use of condoms, avoidance of unintended pregnancy  and contraceptive alternatives.   Advised to avoid cigarette smoking.  I discussed with the patient that most people either abstain from alcohol or drink within safe limits (<=14/week and <=4 drinks/occasion for males, <=7/weeks and <= 3 drinks/occasion for females) and that the risk for alcohol disorders and other health effects rises proportionally with the number of drinks per week and how often a drinker exceeds daily limits.  Discussed cessation/primary prevention of drug use and availability of treatment for abuse.   Diet: Encouraged to adjust caloric intake to maintain  or achieve ideal body weight, to reduce intake of dietary saturated fat and total fat, to limit sodium intake by avoiding high sodium foods and not adding table salt, and to maintain adequate dietary potassium and calcium preferably from fresh fruits, vegetables, and low-fat dairy products.    Stressed the importance of regular exercise  Injury prevention: Discussed safety belts, safety helmets, smoke detector, smoking near bedding or upholstery.   Dental health: Discussed importance of  regular tooth brushing, flossing, and dental visits.   Follow up plan: NEXT PREVENTATIVE PHYSICAL DUE IN 1 YEAR. Return in about 6 months (around 05/27/2021) for MOOD, HTN/HLD.

## 2020-11-26 NOTE — Telephone Encounter (Signed)
Kristi replied back to me and stated that they are not able to schedule this EUS because he would need a removal. Therefore, a referral to [facility] was faxed with the request.  Fax: [phone] Phone: [phone]

## 2020-11-26 NOTE — Patient Instructions (Signed)

## 2020-11-26 NOTE — Telephone Encounter (Signed)
EUS message was sent to Herschell Dimes, RN at the [facility] to please schedule patient an appointment to be seen.

## 2020-11-26 NOTE — Assessment & Plan Note (Signed)
Awaiting visit with [facility] or [facility] for further recommendations + awaiting pathology on parasite for tx by GI.

## 2020-11-26 NOTE — Assessment & Plan Note (Signed)
Noted on past labs, check lipid panel today and CMP.  ASCVD 3.6%, diet focus at this time.

## 2020-11-26 NOTE — Assessment & Plan Note (Signed)
New over past 6 months.  Will check morning testosterone today -- has taken no medications this morning.  Start Viagra as needed and educated him on this.

## 2020-11-26 NOTE — Assessment & Plan Note (Signed)
Consider repeat sleep study in future, at this time continue side sleeping as instructed. 

## 2020-11-26 NOTE — Assessment & Plan Note (Signed)
BMI 35.44.  Recommended eating smaller high protein, low fat meals more frequently and exercising 30 mins a day 5 times a week with a goal of 10-15lb weight loss in the next 3 months. Patient voiced their understanding and motivation to adhere to these recommendations.

## 2020-11-27 LAB — TESTOSTERONE, FREE, TOTAL, SHBG
Sex Hormone Binding: 42.4 nmol/L (ref 16.5–55.9)
Testosterone, Free: 7 pg/mL (ref 6.8–21.5)
Testosterone: 351 ng/dL (ref 264–916)

## 2020-11-27 NOTE — Progress Notes (Signed)
Contacted via MyChart The 10-year ASCVD risk score (Arnett DK, et al., 2019) is: 3.4%   Values used to calculate the score:     Age: 45 years     Sex: Male     Is Non-Hispanic African American: No     Diabetic: No     Tobacco smoker: No     Systolic Blood Pressure: 053 mmHg     Is BP treated: Yes     HDL Cholesterol: 43 mg/dL     Total Cholesterol: 213 mg/dL  Good evening Darnelle Maffucci, your labs have returned, with exception of HIV testing which I am waiting on results, and overall everything is stable with exception of cholesterol levels.  Testosterone levels are normal, low range of normal, but normal. Your cholesterol is still high, but continued recommendations to make lifestyle changes. Your LDL is above normal. The LDL is the bad cholesterol. Over time and in combination with inflammation and other factors, this contributes to plaque which in turn may lead to stroke and/or heart attack down the road. Sometimes high LDL is primarily genetic, and people might be eating all the right foods but still have high numbers. Other times, there is room for improvement in one's diet and eating healthier can bring this number down and potentially reduce one's risk of heart attack and/or stroke.   To reduce your LDL, Remember - more fruits and vegetables, more fish, and limit red meat and dairy products. More soy, nuts, beans, barley, lentils, oats and plant sterol ester enriched margarine instead of butter. I also encourage eliminating sugar and processed food. Remember, shop on the outside of the grocery store and visit your [employer]. If you would like to talk with me about dietary changes for your cholesterol, please let me know. We should recheck your cholesterol in 6 months.  Any questions on these? Keep being stellar!!  Thank you for allowing me to participate in your care.  I appreciate you. Kindest regards, Tykesha Konicki

## 2020-11-28 ENCOUNTER — Telehealth: Payer: Self-pay

## 2020-11-28 DIAGNOSIS — K639 Disease of intestine, unspecified: Secondary | ICD-10-CM

## 2020-11-28 NOTE — Telephone Encounter (Signed)
A nurse from [facility] called stating that the physician reviewed patient's case and they do not think that they could to an EUS on patient's lesion. However, they recommend for you to order a CT Scan. Please advise.

## 2020-11-29 NOTE — Telephone Encounter (Signed)
Called patient to inform him that Dr. Vicente Males would want for him to have a CT Scan done after the [facility] physician recommended for him to have it done since he doesn't agreed on doing an EUS for the patient. I have to leave him a detailed message with instructions since he did not pick up.

## 2020-11-30 LAB — CBC WITH DIFFERENTIAL/PLATELET
Basophils Absolute: 0.1 10*3/uL (ref 0.0–0.2)
Basos: 1 %
EOS (ABSOLUTE): 0.2 10*3/uL (ref 0.0–0.4)
Eos: 2 %
Hematocrit: 46.7 % (ref 37.5–51.0)
Hemoglobin: 15.3 g/dL (ref 13.0–17.7)
Immature Grans (Abs): 0 10*3/uL (ref 0.0–0.1)
Immature Granulocytes: 0 %
Lymphocytes Absolute: 1.6 10*3/uL (ref 0.7–3.1)
Lymphs: 23 %
MCH: 28.8 pg (ref 26.6–33.0)
MCHC: 32.8 g/dL (ref 31.5–35.7)
MCV: 88 fL (ref 79–97)
Monocytes Absolute: 0.7 10*3/uL (ref 0.1–0.9)
Monocytes: 10 %
Neutrophils Absolute: 4.5 10*3/uL (ref 1.4–7.0)
Neutrophils: 64 %
Platelets: 263 10*3/uL (ref 150–450)
RBC: 5.31 x10E6/uL (ref 4.14–5.80)
RDW: 12.4 % (ref 11.6–15.4)
WBC: 7.2 10*3/uL (ref 3.4–10.8)

## 2020-11-30 LAB — HIV-1/HIV-2 QUALITATIVE RNA
Final Interpretation: NEGATIVE
HIV-1 RNA, Qualitative: NONREACTIVE
HIV-2 RNA, Qualitative: NONREACTIVE

## 2020-11-30 LAB — COMPREHENSIVE METABOLIC PANEL
ALT: 14 IU/L (ref 0–44)
AST: 16 IU/L (ref 0–40)
Albumin/Globulin Ratio: 1.9 (ref 1.2–2.2)
Albumin: 4.6 g/dL (ref 4.0–5.0)
Alkaline Phosphatase: 88 IU/L (ref 44–121)
BUN/Creatinine Ratio: 17 (ref 9–20)
BUN: 15 mg/dL (ref 6–24)
Bilirubin Total: 0.4 mg/dL (ref 0.0–1.2)
CO2: 22 mmol/L (ref 20–29)
Calcium: 9 mg/dL (ref 8.7–10.2)
Chloride: 102 mmol/L (ref 96–106)
Creatinine, Ser: 0.89 mg/dL (ref 0.76–1.27)
Globulin, Total: 2.4 g/dL (ref 1.5–4.5)
Glucose: 87 mg/dL (ref 70–99)
Potassium: 4.3 mmol/L (ref 3.5–5.2)
Sodium: 141 mmol/L (ref 134–144)
Total Protein: 7 g/dL (ref 6.0–8.5)
eGFR: 108 mL/min/{1.73_m2} (ref >59)

## 2020-11-30 LAB — VITAMIN D 25 HYDROXY (VIT D DEFICIENCY, FRACTURES): Vit D, 25-Hydroxy: 34.8 ng/mL (ref 30.0–100.0)

## 2020-11-30 LAB — HIV ANTIBODY (ROUTINE TESTING W REFLEX): HIV Screen 4th Generation wRfx: REACTIVE

## 2020-11-30 LAB — LIPID PANEL W/O CHOL/HDL RATIO
Cholesterol, Total: 213 mg/dL — ABNORMAL HIGH (ref 100–199)
HDL: 43 mg/dL (ref >39)
LDL Chol Calc (NIH): 150 mg/dL — ABNORMAL HIGH (ref 0–99)
Triglycerides: 112 mg/dL (ref 0–149)
VLDL Cholesterol Cal: 20 mg/dL (ref 5–40)

## 2020-11-30 LAB — HEPATITIS C ANTIBODY: Hep C Virus Ab: <0.1 s/co ratio (ref 0.0–0.9)

## 2020-11-30 LAB — HIV 1/2 AB DIFFERENTIATION
HIV 1 Ab: NONREACTIVE
HIV 2 Ab: NONREACTIVE
NOTE (HIV CONF MULTIP: NEGATIVE

## 2020-11-30 LAB — PSA: Prostate Specific Ag, Serum: 1.3 ng/mL (ref 0.0–4.0)

## 2020-11-30 LAB — TSH: TSH: 1.07 u[IU]/mL (ref 0.450–4.500)

## 2020-11-30 NOTE — Progress Notes (Signed)
Contacted via [place] morning Joe Gutierrez, your HIV returned negative:)

## 2020-12-18 ENCOUNTER — Other Ambulatory Visit: Payer: Self-pay

## 2020-12-18 ENCOUNTER — Other Ambulatory Visit: Payer: Self-pay | Admitting: Nurse Practitioner

## 2020-12-18 ENCOUNTER — Ambulatory Visit
Admission: RE | Admit: 2020-12-18 | Discharge: 2020-12-18 | Disposition: A | Discharge status: Home / Self Care | Payer: 59 | Source: Ambulatory Visit | Attending: Gastroenterology | Admitting: Gastroenterology

## 2020-12-18 DIAGNOSIS — K639 Disease of intestine, unspecified: Secondary | ICD-10-CM | POA: Diagnosis not present

## 2020-12-18 MED ORDER — IOHEXOL 300 MG/ML  SOLN
100.0000 mL | Freq: Once | INTRAMUSCULAR | Status: AC | PRN
  Administered 2020-12-18: 100 mL via INTRAVENOUS

## 2020-12-18 NOTE — Telephone Encounter (Signed)
Call to pharmacy- they do have refills on file- will fill now. Requested Prescriptions  Pending Prescriptions Disp Refills  . lisinopril (ZESTRIL) 40 MG tablet [Pharmacy Med Name: LISINOPRIL 40MG  TABLETS] 90 tablet 4    Sig: TAKE 1 TABLET(40 MG) BY MOUTH DAILY     Cardiovascular:  ACE Inhibitors Passed - 12/18/2020  7:07 AM      Passed - Cr in normal range and within 180 days    Creatinine  Date Value Ref Range Status  08/09/2012 0.96 0.60 - 1.30 mg/dL Final   Creatinine, Ser  Date Value Ref Range Status  11/26/2020 0.89 0.76 - 1.27 mg/dL Final         Passed - K in normal range and within 180 days    Potassium  Date Value Ref Range Status  11/26/2020 4.3 3.5 - 5.2 mmol/L Final  08/09/2012 4.3 3.5 - 5.1 mmol/L Final         Passed - Patient is not pregnant      Passed - Last BP in normal range    BP Readings from Last 1 Encounters:  11/26/20 132/86         Passed - Valid encounter within last 6 months    Recent Outpatient Visits          3 weeks ago Depression with anxiety   [place], Henrine Screws T, NP   1 month ago Acute pain of right wrist   [place], Henrine Screws T, NP   2 months ago Depression with anxiety   [place], [place] T, NP   4 months ago Depression with anxiety   [place], [place] T, NP   6 months ago Essential hypertension, benign   [place], Barbaraann Faster, NP      Future Appointments            In 5 months [place], Barbaraann Faster, NP [employer]

## 2020-12-18 NOTE — Telephone Encounter (Signed)
Attempted to call patient- left message should have RF for 1 year- 10/01/20 #[id number]. Will confirm at pharmacy when they open.

## 2020-12-19 ENCOUNTER — Telehealth: Payer: Self-pay

## 2020-12-19 DIAGNOSIS — B829 Intestinal parasitism, unspecified: Secondary | ICD-10-CM

## 2020-12-19 DIAGNOSIS — K639 Disease of intestine, unspecified: Secondary | ICD-10-CM

## 2020-12-19 NOTE — Telephone Encounter (Signed)
Called patient but had to leave him a detailed message letting him know what Dr. Vicente Males stated below. I told patient to come to our office or go to [facility] to pick up the supplies he needs to for stool sample. I also let him know that the cancer center will call him to schedule an EUS instead of [facility]. Lastly, I told him that Dr. Vicente Males wants him to see Dr. Dahlia Byes for a consultation for him to remove his mass in his cecum.

## 2020-12-19 NOTE — Telephone Encounter (Signed)
-----   Message from [name], MD sent at 12/19/2020  8:36 AM EDT ----- Joe Gutierrez  1. Lets refer patient to Dr Dahlia Byes for cecal resection evaluation for mass in cecum  2. Check stool for ova and parasites- for some reason the lab has not processed the stool sample provided during endoscopy . I will prescribe anti parasite medication after we get stool result irrespective of the result. Would prefer to know the exac t type of parasite if possible 3. If patient has any questions please schedule video visit or office visit next week  4. Cancel EUS referral to [facility] as they are not doing it   C/c Cannady, Barbaraann Faster, NP (FYI)

## 2020-12-19 NOTE — Progress Notes (Signed)
Joe Gutierrez  1. Lets refer patient to Dr Dahlia Byes for cecal resection evaluation for mass in cecum  2. Check stool for ova and parasites- for some reason the lab has not processed the stool sample provided during endoscopy . I will prescribe anti parasite medication after we get stool result irrespective of the result. Would prefer to know the exact type of parasite if possible 3. If patient has any questions please schedule video visit or office visit next week  4. Cancel EUS referral to [facility] as they are not doing it   C/c Cannady, Barbaraann Faster, NP (FYI)

## 2020-12-20 ENCOUNTER — Encounter: Payer: Self-pay | Admitting: Nurse Practitioner

## 2020-12-20 NOTE — Progress Notes (Signed)
Joe Gutierrez did we convey prior message to patient ? Apparently he called Joe Lick, NP to discuss next steps

## 2020-12-28 LAB — OVA AND PARASITE EXAMINATION

## 2021-01-02 ENCOUNTER — Ambulatory Visit (INDEPENDENT_AMBULATORY_CARE_PROVIDER_SITE_OTHER): Payer: 59 | Admitting: Surgery

## 2021-01-02 ENCOUNTER — Encounter: Payer: Self-pay | Admitting: Surgery

## 2021-01-02 ENCOUNTER — Other Ambulatory Visit: Payer: Self-pay

## 2021-01-02 ENCOUNTER — Telehealth: Payer: Self-pay

## 2021-01-02 VITALS — BP 144/86 | HR 64 | Temp 98.3°F | Ht 70.0 in | Wt 247.2 lb

## 2021-01-02 DIAGNOSIS — K6389 Other specified diseases of intestine: Secondary | ICD-10-CM

## 2021-01-02 MED ORDER — NEOMYCIN SULFATE 500 MG PO TABS: 1000.0000 mg | ORAL_TABLET | Freq: Three times a day (TID) | ORAL | 0 refills | Status: DC

## 2021-01-02 MED ORDER — ALBENDAZOLE 200 MG PO TABS: 400.0000 mg | ORAL_TABLET | Freq: Once | ORAL | 0 refills | Status: AC

## 2021-01-02 MED ORDER — METRONIDAZOLE 500 MG PO TABS: ORAL_TABLET | ORAL | 0 refills | Status: DC

## 2021-01-02 MED ORDER — BISACODYL 5 MG PO TBEC: 5.0000 mg | DELAYED_RELEASE_TABLET | Freq: Once | ORAL | 0 refills | Status: AC

## 2021-01-02 MED ORDER — POLYETHYLENE GLYCOL 3350 17 GM/SCOOP PO POWD: 1.0000 | Freq: Once | ORAL | 0 refills | Status: AC

## 2021-01-02 NOTE — Patient Instructions (Addendum)
Our surgery scheduler will call you within 24-48 hours to schedule your surgery. Please have the [place] surgery sheet available when speaking with her. Please follow the Bowel prep sheet instructions.

## 2021-01-02 NOTE — Telephone Encounter (Signed)
Patient notified of Albenazole 400 mg I tablets.

## 2021-01-03 ENCOUNTER — Telehealth: Payer: Self-pay | Admitting: Surgery

## 2021-01-03 NOTE — Telephone Encounter (Signed)
Patient has been advised of Pre-Admission date/time, COVID Testing date and Surgery date.  Surgery Date: 01/24/21 Preadmission Testing Date: 01/17/21 (phone 8a-1p) Covid Testing Date: 01/22/21 @ 8:00 am,  - patient advised to go to the [facility] ([place] between 8a-12:00 pm  Patient has been made aware to call [phone], between 1-3:00pm the day before surgery, to find out what time to arrive for surgery.

## 2021-01-04 NOTE — H&P (View-Only) (Signed)
Patient ID: Joe Gutierrez, male   DOB: September 27, 1975, 45 y.o.   MRN: [id number]  HPI Joe Gutierrez is a 45 y.o. male seen in consultation at the request of Dr. Vicente Males for unresectable polyp at the cecum.  Joe Gutierrez was undergoing a routine screening colonoscopy.  Specifically he denied any abdominal pain no hematochezia he does endorse increase in bowel movements about 6-8 a day.  I have personally reviewed the images of the colonoscopy and the polyp is right next to the ileocecal valve and measures at least 1/2 cm.  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  There are no biopsies available.  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 He has not started treatment. Fecal analysis failed to revealed any parasites.  Joe Gutierrez is otherwise healthy he is able to perform more than 4 METS of activity without any shortness of breath or chest pain.  No known family history of colorectal cancer. He does have a CBC and CMP that is completely normal.  He is accompanied by his wife.  Surgical history is significant for open appendectomy in the remote past  HPI  Past Medical History:  Diagnosis Date   Hypertension    Kidney stones    Obesity     Past Surgical History:  Procedure Laterality Date   APPENDECTOMY     COLONOSCOPY WITH PROPOFOL N/A 11/16/2020   Procedure: COLONOSCOPY WITH PROPOFOL;  Surgeon: [name], MD;  Location: [facility] ENDOSCOPY;  Service: Gastroenterology;  Laterality: N/A;   LITHOTRIPSY      Family History  Problem Relation Age of Onset   Diabetes Mother    Hypertension Mother    Alcohol abuse Father    Heart disease Father    Hypertension Father    Diabetes Maternal Grandmother    Stroke Maternal Grandfather    Hypertension Brother    Asthma Daughter     Heart disease Paternal Grandfather     Social History Social History   Tobacco Use   Smoking status: Never   Smokeless tobacco: Never  Vaping Use   Vaping Use: Never used  Substance Use Topics   Alcohol use: No   Drug use: No    Allergies  Allergen Reactions   Codeine Hives   Sulfa Antibiotics Hives    Current Outpatient Medications  Medication Sig Dispense Refill   busPIRone (BUSPAR) 5 MG tablet Take 1 tablet (5 mg total) by mouth 2 (two) times daily. 180 tablet 4   lisinopril (ZESTRIL) 40 MG tablet TAKE 1 TABLET(40 MG) BY MOUTH DAILY 90 tablet 4   metroNIDAZOLE (FLAGYL) 500 MG tablet Take 2 tablets at 8 AM, take 2 tablets at 2 PM, and take 2 tablets at 8 PM the day prior to your surgery. 6 tablet 0   neomycin (MYCIFRADIN) 500 MG tablet Take 2 tablets (1,000 mg total) by mouth 3 (three) times daily. 6 tablet 0   sertraline (ZOLOFT) 100 MG tablet Take 2 tablets (200 mg total) by mouth daily. 180 tablet 4   sildenafil (VIAGRA) 50 MG tablet Take 1 tablet (50 mg total) by mouth daily as needed for erectile dysfunction. 10 tablet 0   Vitamin D, Cholecalciferol, 25 MCG (1000 UT) TABS Take 1 tablet by mouth daily.     No current facility-administered medications  for this visit.     Review of Systems Full ROS  was asked and was negative except for the information on the HPI  Physical Exam Blood pressure (!) 144/86, pulse 64, temperature 98.3 F (36.8 C), temperature source Oral, height 5\' 10"  (1.778 m), weight 247 lb 3.2 oz (112.1 kg), SpO2 98 %. CONSTITUTIONAL: NAD. EYES: Pupils are equal, round, Sclera are non-icteric. EARS, NOSE, MOUTH AND THROAT: He is wearing a mask.Marland Kitchen Hearing is intact to voice. LYMPH NODES:  Lymph nodes in the neck are normal. RESPIRATORY:  Lungs are clear. There is normal respiratory effort, with equal breath sounds bilaterally, and without pathologic use of accessory muscles. CARDIOVASCULAR: Heart is regular without murmurs, gallops, or rubs. GI:  The abdomen is  soft, nontender, and nondistended. There are no palpable masses. There is no hepatosplenomegaly. There are normal bowel sounds. GU: Rectal deferred.   MUSCULOSKELETAL: Normal muscle strength and tone. No cyanosis or edema.   SKIN: Turgor is good and there are no pathologic skin lesions or ulcers. NEUROLOGIC: Motor and sensation is grossly normal. Cranial nerves are grossly intact. PSYCH:  Oriented to person, place and time. Affect is normal.  Data Reviewed  I have personally reviewed the patient's imaging, laboratory findings and medical records.    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 I had an extensive discussion with the patient and his wife regarding my thought process.  To assure appropriate resection of potential malignancy I do recommend right colectomy.  I do think that he will be a great candidate for minimally invasive approach.  Regarding his parasites I will go ahead and treat him with mebendazole x1 empirically.  I had an extensive discussion with him regarding the proposed surgery.  Risks, benefits and possible complications including but not limited to: Bleeding, infection injury to adjacent structures, anastomotic leak, wound infections and potential interventions.  Also discussed with them pre and postoperative care.  Extensive counseling provided.  A copy of this report was sent to the referring provider.  Please note that I spent 80 minutes in this encounter including personally reviewing the images, reviewing medical records, coordinating his care, placing orders and performing appropriate documentation.  Caroleen Hamman, MD FACS General Surgeon 01/04/2021, 9:27 AM

## 2021-01-04 NOTE — Progress Notes (Signed)
Patient ID: Joe Gutierrez, male   DOB: 22-Feb-1975, 45 y.o.   MRN: [id number]  HPI Joe Gutierrez is a 44 y.o. male seen in consultation at the request of Dr. Vicente Males for unresectable polyp at the cecum.  Joe Gutierrez was undergoing a routine screening colonoscopy.  Specifically he denied any abdominal pain no hematochezia he does endorse increase in bowel movements about 6-8 a day.  I have personally reviewed the images of the colonoscopy and the polyp is right next to the ileocecal valve and measures at least 1/2 cm.  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  There are no biopsies available.  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 He has not started treatment. Fecal analysis failed to revealed any parasites.  Joe Gutierrez is otherwise healthy he is able to perform more than 4 METS of activity without any shortness of breath or chest pain.  No known family history of colorectal cancer. He does have a CBC and CMP that is completely normal.  He is accompanied by his wife.  Surgical history is significant for open appendectomy in the remote past  HPI  Past Medical History:  Diagnosis Date   Hypertension    Kidney stones    Obesity     Past Surgical History:  Procedure Laterality Date   APPENDECTOMY     COLONOSCOPY WITH PROPOFOL N/A 11/16/2020   Procedure: COLONOSCOPY WITH PROPOFOL;  Surgeon: [name], MD;  Location: [facility] ENDOSCOPY;  Service: Gastroenterology;  Laterality: N/A;   LITHOTRIPSY      Family History  Problem Relation Age of Onset   Diabetes Mother    Hypertension Mother    Alcohol abuse Father    Heart disease Father    Hypertension Father    Diabetes Maternal Grandmother    Stroke Maternal Grandfather    Hypertension Brother    Asthma Daughter     Heart disease Paternal Grandfather     Social History Social History   Tobacco Use   Smoking status: Never   Smokeless tobacco: Never  Vaping Use   Vaping Use: Never used  Substance Use Topics   Alcohol use: No   Drug use: No    Allergies  Allergen Reactions   Codeine Hives   Sulfa Antibiotics Hives    Current Outpatient Medications  Medication Sig Dispense Refill   busPIRone (BUSPAR) 5 MG tablet Take 1 tablet (5 mg total) by mouth 2 (two) times daily. 180 tablet 4   lisinopril (ZESTRIL) 40 MG tablet TAKE 1 TABLET(40 MG) BY MOUTH DAILY 90 tablet 4   metroNIDAZOLE (FLAGYL) 500 MG tablet Take 2 tablets at 8 AM, take 2 tablets at 2 PM, and take 2 tablets at 8 PM the day prior to your surgery. 6 tablet 0   neomycin (MYCIFRADIN) 500 MG tablet Take 2 tablets (1,000 mg total) by mouth 3 (three) times daily. 6 tablet 0   sertraline (ZOLOFT) 100 MG tablet Take 2 tablets (200 mg total) by mouth daily. 180 tablet 4   sildenafil (VIAGRA) 50 MG tablet Take 1 tablet (50 mg total) by mouth daily as needed for erectile dysfunction. 10 tablet 0   Vitamin D, Cholecalciferol, 25 MCG (1000 UT) TABS Take 1 tablet by mouth daily.     No current facility-administered medications  for this visit.     Review of Systems Full ROS  was asked and was negative except for the information on the HPI  Physical Exam Blood pressure (!) 144/86, pulse 64, temperature 98.3 F (36.8 C), temperature source Oral, height 5\' 10"  (1.778 m), weight 247 lb 3.2 oz (112.1 kg), SpO2 98 %. CONSTITUTIONAL: NAD. EYES: Pupils are equal, round, Sclera are non-icteric. EARS, NOSE, MOUTH AND THROAT: He is wearing a mask.Marland Kitchen Hearing is intact to voice. LYMPH NODES:  Lymph nodes in the neck are normal. RESPIRATORY:  Lungs are clear. There is normal respiratory effort, with equal breath sounds bilaterally, and without pathologic use of accessory muscles. CARDIOVASCULAR: Heart is regular without murmurs, gallops, or rubs. GI:  The abdomen is  soft, nontender, and nondistended. There are no palpable masses. There is no hepatosplenomegaly. There are normal bowel sounds. GU: Rectal deferred.   MUSCULOSKELETAL: Normal muscle strength and tone. No cyanosis or edema.   SKIN: Turgor is good and there are no pathologic skin lesions or ulcers. NEUROLOGIC: Motor and sensation is grossly normal. Cranial nerves are grossly intact. PSYCH:  Oriented to person, place and time. Affect is normal.  Data Reviewed  I have personally reviewed the patient's imaging, laboratory findings and medical records.    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 I had an extensive discussion with the patient and his wife regarding my thought process.  To assure appropriate resection of potential malignancy I do recommend right colectomy.  I do think that he will be a great candidate for minimally invasive approach.  Regarding his parasites I will go ahead and treat him with mebendazole x1 empirically.  I had an extensive discussion with him regarding the proposed surgery.  Risks, benefits and possible complications including but not limited to: Bleeding, infection injury to adjacent structures, anastomotic leak, wound infections and potential interventions.  Also discussed with them pre and postoperative care.  Extensive counseling provided.  A copy of this report was sent to the referring provider.  Please note that I spent 80 minutes in this encounter including personally reviewing the images, reviewing medical records, coordinating his care, placing orders and performing appropriate documentation.  Caroleen Hamman, MD FACS General Surgeon 01/04/2021, 9:27 AM

## 2021-01-08 ENCOUNTER — Telehealth: Payer: Self-pay

## 2021-01-08 DIAGNOSIS — B829 Intestinal parasitism, unspecified: Secondary | ICD-10-CM

## 2021-01-08 NOTE — Telephone Encounter (Signed)
Patient was called but had to leave him a detailed message letting know the below information. I will place the order so it could be ready for patient to pick up the supplies. I will also send him a MyChart message.

## 2021-01-08 NOTE — Telephone Encounter (Signed)
-----   Message from [name], MD sent at 12/31/2020  8:16 AM EST ----- Herb Grays - inform sample negative- will need to repeat it 2 ensure still negative and if still negative will treat with 1 single dose of albendazole 400 mg please   Dr [name] MD,MRCP [facility]) Gastroenterology/Hepatology Pager: [phone]

## 2021-01-17 ENCOUNTER — Other Ambulatory Visit
Admission: RE | Admit: 2021-01-17 | Discharge: 2021-01-17 | Disposition: A | Discharge status: Home / Self Care | Payer: 59 | Source: Ambulatory Visit | Attending: Surgery | Admitting: Surgery

## 2021-01-17 DIAGNOSIS — Z01812 Encounter for preprocedural laboratory examination: Secondary | ICD-10-CM

## 2021-01-17 HISTORY — DX: Personal history of urinary calculi: Z87.442

## 2021-01-17 HISTORY — DX: Unspecified osteoarthritis, unspecified site: M19.90

## 2021-01-17 HISTORY — DX: Anxiety disorder, unspecified: F41.9

## 2021-01-17 HISTORY — DX: Malignant (primary) neoplasm, unspecified: C80.1

## 2021-01-17 HISTORY — DX: Cardiac murmur, unspecified: R01.1

## 2021-01-17 HISTORY — DX: Depression, unspecified: F32.A

## 2021-01-17 HISTORY — DX: Sleep apnea, unspecified: G47.30

## 2021-01-17 NOTE — Patient Instructions (Addendum)
Your procedure is scheduled on: 01/24/21 - Thursday Report to the Registration Desk on the 1st floor of the [facility]. To find out your arrival time, please call [phone] between 1PM - 3PM on: 01/23/21 - Wednesday  Report to [facility] for Labs/EKG on 01/22/21 at 8:00 am.  REMEMBER: Instructions that are not followed completely may result in serious medical risk, up to and including death; or upon the discretion of your surgeon and anesthesiologist your surgery may need to be rescheduled.  Follow Bowel Prep and Medication regimen given to you by Dr. Corlis Leak Office.  TAKE THESE MEDICATIONS THE MORNING OF SURGERY WITH A SIP OF WATER:  - busPIRone (BUSPAR) 5 MG tablet - sertraline (ZOLOFT) 100 MG tablet  One week prior to surgery: Stop Anti-inflammatories (NSAIDS) such as Advil, Aleve, Ibuprofen, Motrin, Naproxen, Naprosyn and Aspirin based products such as Excedrin, Goodys Powder, BC Powder.  Stop ANY OVER THE COUNTER supplements until after surgery.  You may however, continue to take Tylenol if needed for pain up until the day of surgery.  No Alcohol for 24 hours before or after surgery.  No Smoking including e-cigarettes for 24 hours prior to surgery.  No chewable tobacco products for at least 6 hours prior to surgery.  No nicotine patches on the day of surgery.  Do not use any "recreational" drugs for at least a week prior to your surgery.  Please be advised that the combination of cocaine and anesthesia may have negative outcomes, up to and including death. If you test positive for cocaine, your surgery will be cancelled.  On the morning of surgery brush your teeth with toothpaste and water, you may rinse your mouth with mouthwash if you wish. Do not swallow any toothpaste or mouthwash.  Use CHG Soap or wipes as directed on instruction sheet.  Do not wear jewelry, make-up, hairpins, clips or nail polish.  Do not wear lotions, powders, or perfumes.   Do not  shave body from the neck down 48 hours prior to surgery just in case you cut yourself which could leave a site for infection.  Also, freshly shaved skin may become irritated if using the CHG soap.  Contact lenses, hearing aids and dentures may not be worn into surgery.  Do not bring valuables to the hospital. [facility] is not responsible for any missing/lost belongings or valuables.   Fleets enema or bowel prep as directed.  Notify your doctor if there is any change in your medical condition (cold, fever, infection).  Wear comfortable clothing (specific to your surgery type) to the hospital.  After surgery, you can help prevent lung complications by doing breathing exercises.  Take deep breaths and cough every 1-2 hours. Your doctor may order a device called an Incentive Spirometer to help you take deep breaths. When coughing or sneezing, hold a pillow firmly against your incision with both hands. This is called "splinting." Doing this helps protect your incision. It also decreases belly discomfort.  If you are being admitted to the hospital overnight, leave your suitcase in the car. After surgery it may be brought to your room.  If you are being discharged the day of surgery, you will not be allowed to drive home. You will need a responsible adult (18 years or older) to drive you home and stay with you that night.   If you are taking public transportation, you will need to have a responsible adult (18 years or older) with you. Please confirm with your  physician that it is acceptable to use public transportation.   Please call the [facility]. at [phone] if you have any questions about these instructions.  Surgery Visitation Policy:  Patients undergoing a surgery or procedure may have one family member or support person with them as long as that person is not COVID-19 positive or experiencing its symptoms.  That person may remain in the waiting area during the  procedure and may rotate out with other people.  Inpatient Visitation:    Visiting hours are 7 a.m. to 8 p.m. Up to two visitors ages 16+ are allowed at one time in a patient room. The visitors may rotate out with other people during the day. Visitors must check out when they leave, or other visitors will not be allowed. One designated support person may remain overnight. The visitor must pass COVID-19 screenings, use hand sanitizer when entering and exiting the patient's room and wear a mask at all times, including in the patient's room. Patients must also wear a mask when staff or their visitor are in the room. Masking is required regardless of vaccination status. Your procedure is scheduled on: Report to the Registration Desk on the 1st floor of the [facility]. To find out your arrival time, please call [phone] between 1PM - 3PM on:

## 2021-01-22 ENCOUNTER — Other Ambulatory Visit: Payer: Self-pay

## 2021-01-22 ENCOUNTER — Other Ambulatory Visit
Admission: RE | Admit: 2021-01-22 | Discharge: 2021-01-22 | Disposition: A | Discharge status: Home / Self Care | Payer: 59 | Source: Ambulatory Visit | Attending: Surgery | Admitting: Surgery

## 2021-01-22 DIAGNOSIS — Z01818 Encounter for other preprocedural examination: Secondary | ICD-10-CM | POA: Insufficient documentation

## 2021-01-22 DIAGNOSIS — Z20822 Contact with and (suspected) exposure to covid-19: Secondary | ICD-10-CM | POA: Diagnosis not present

## 2021-01-22 DIAGNOSIS — Z01812 Encounter for preprocedural laboratory examination: Secondary | ICD-10-CM

## 2021-01-22 LAB — TYPE AND SCREEN
ABO/RH(D): O POS
Antibody Screen: NEGATIVE

## 2021-01-22 LAB — SARS CORONAVIRUS 2 (TAT 6-24 HRS): SARS Coronavirus 2: NEGATIVE

## 2021-01-24 ENCOUNTER — Other Ambulatory Visit: Payer: Self-pay

## 2021-01-24 ENCOUNTER — Inpatient Hospital Stay: Payer: 59 | Admitting: Anesthesiology

## 2021-01-24 ENCOUNTER — Encounter
Admission: RE | Disposition: A | Discharge status: Home / Self Care | Payer: Self-pay | Source: Home / Self Care | Attending: Surgery

## 2021-01-24 ENCOUNTER — Encounter: Payer: Self-pay | Admitting: Surgery

## 2021-01-24 ENCOUNTER — Inpatient Hospital Stay
Admission: RE | Admit: 2021-01-24 | Discharge: 2021-01-26 | DRG: 331 | Disposition: A | Discharge status: Home / Self Care | Payer: 59 | Attending: Surgery | Admitting: Surgery

## 2021-01-24 DIAGNOSIS — E669 Obesity, unspecified: Secondary | ICD-10-CM | POA: Diagnosis present

## 2021-01-24 DIAGNOSIS — Z882 Allergy status to sulfonamides status: Secondary | ICD-10-CM | POA: Diagnosis not present

## 2021-01-24 DIAGNOSIS — Z6834 Body mass index (BMI) 34.0-34.9, adult: Secondary | ICD-10-CM

## 2021-01-24 DIAGNOSIS — Z885 Allergy status to narcotic agent status: Secondary | ICD-10-CM

## 2021-01-24 DIAGNOSIS — Z79899 Other long term (current) drug therapy: Secondary | ICD-10-CM

## 2021-01-24 DIAGNOSIS — I10 Essential (primary) hypertension: Secondary | ICD-10-CM | POA: Diagnosis present

## 2021-01-24 DIAGNOSIS — K6389 Other specified diseases of intestine: Secondary | ICD-10-CM

## 2021-01-24 DIAGNOSIS — D120 Benign neoplasm of cecum: Secondary | ICD-10-CM | POA: Diagnosis present

## 2021-01-24 DIAGNOSIS — Z8249 Family history of ischemic heart disease and other diseases of the circulatory system: Secondary | ICD-10-CM | POA: Diagnosis not present

## 2021-01-24 DIAGNOSIS — Z9049 Acquired absence of other specified parts of digestive tract: Secondary | ICD-10-CM

## 2021-01-24 HISTORY — PX: LAPAROSCOPIC RIGHT COLECTOMY: SHX5925

## 2021-01-24 LAB — CREATININE, SERUM
Creatinine, Ser: 1.12 mg/dL (ref 0.61–1.24)
GFR, Estimated: >60 mL/min (ref >60)

## 2021-01-24 LAB — CBC
HCT: 42.8 % (ref 39.0–52.0)
Hemoglobin: 14.2 g/dL (ref 13.0–17.0)
MCH: 29.2 pg (ref 26.0–34.0)
MCHC: 33.2 g/dL (ref 30.0–36.0)
MCV: 88.1 fL (ref 80.0–100.0)
Platelets: 193 10*3/uL (ref 150–400)
RBC: 4.86 MIL/uL (ref 4.22–5.81)
RDW: 12 % (ref 11.5–15.5)
WBC: 16.8 10*3/uL — ABNORMAL HIGH (ref 4.0–10.5)
nRBC: 0 % (ref 0.0–0.2)

## 2021-01-24 LAB — ABO/RH: ABO/RH(D): O POS

## 2021-01-24 SURGERY — COLECTOMY, RIGHT, LAPAROSCOPIC
Anesthesia: General

## 2021-01-24 MED ORDER — ALBUMIN HUMAN 5 % IV SOLN
INTRAVENOUS | Status: AC
  Filled 2021-01-24: qty 250

## 2021-01-24 MED ORDER — ALVIMOPAN 12 MG PO CAPS: 12.0000 mg | ORAL_CAPSULE | ORAL | Status: AC

## 2021-01-24 MED ORDER — FENTANYL CITRATE (PF) 100 MCG/2ML IJ SOLN
INTRAMUSCULAR | Status: AC
  Filled 2021-01-24: qty 2

## 2021-01-24 MED ORDER — PREGABALIN 50 MG PO CAPS
50.0000 mg | ORAL_CAPSULE | Freq: Three times a day (TID) | ORAL | Status: DC
  Administered 2021-01-24 – 2021-01-26 (×5): 50 mg via ORAL
  Filled 2021-01-24 (×5): qty 1

## 2021-01-24 MED ORDER — PHENYLEPHRINE 40 MCG/ML (10ML) SYRINGE FOR IV PUSH (FOR BLOOD PRESSURE SUPPORT)
PREFILLED_SYRINGE | INTRAVENOUS | Status: DC | PRN
  Administered 2021-01-24 (×3): 160 ug via INTRAVENOUS

## 2021-01-24 MED ORDER — LACTATED RINGERS IV SOLN: INTRAVENOUS | Status: DC

## 2021-01-24 MED ORDER — ORAL CARE MOUTH RINSE: 15.0000 mL | Freq: Once | OROMUCOSAL | Status: AC

## 2021-01-24 MED ORDER — MIDAZOLAM HCL 2 MG/2ML IJ SOLN
INTRAMUSCULAR | Status: AC
  Filled 2021-01-24: qty 2

## 2021-01-24 MED ORDER — HYDROMORPHONE HCL 1 MG/ML IJ SOLN
INTRAMUSCULAR | Status: AC
  Filled 2021-01-24: qty 1

## 2021-01-24 MED ORDER — ENOXAPARIN SODIUM 40 MG/0.4ML IJ SOSY
40.0000 mg | PREFILLED_SYRINGE | INTRAMUSCULAR | Status: DC
  Administered 2021-01-25 – 2021-01-26 (×2): 40 mg via SUBCUTANEOUS
  Filled 2021-01-24 (×2): qty 0.4

## 2021-01-24 MED ORDER — ALBUMIN HUMAN 25 % IV SOLN
25.0000 g | Freq: Once | INTRAVENOUS | Status: DC
  Filled 2021-01-24: qty 100

## 2021-01-24 MED ORDER — BUPIVACAINE-EPINEPHRINE (PF) 0.5% -1:200000 IJ SOLN: INTRAMUSCULAR | Status: DC | PRN

## 2021-01-24 MED ORDER — GABAPENTIN 300 MG PO CAPS
ORAL_CAPSULE | ORAL | Status: AC
  Filled 2021-01-24: qty 1

## 2021-01-24 MED ORDER — PROCHLORPERAZINE EDISYLATE 10 MG/2ML IJ SOLN: 5.0000 mg | Freq: Four times a day (QID) | INTRAMUSCULAR | Status: DC | PRN

## 2021-01-24 MED ORDER — SUGAMMADEX SODIUM 500 MG/5ML IV SOLN
INTRAVENOUS | Status: DC | PRN
  Administered 2021-01-24: 250 mg via INTRAVENOUS

## 2021-01-24 MED ORDER — DEXAMETHASONE SODIUM PHOSPHATE 10 MG/ML IJ SOLN
INTRAMUSCULAR | Status: DC | PRN
  Administered 2021-01-24: 10 mg via INTRAVENOUS

## 2021-01-24 MED ORDER — PROPOFOL 10 MG/ML IV BOLUS
INTRAVENOUS | Status: DC | PRN
  Administered 2021-01-24: 200 mg via INTRAVENOUS

## 2021-01-24 MED ORDER — KETAMINE HCL 50 MG/5ML IJ SOSY
PREFILLED_SYRINGE | INTRAMUSCULAR | Status: AC
  Filled 2021-01-24: qty 5

## 2021-01-24 MED ORDER — ACETAMINOPHEN 500 MG PO TABS
1000.0000 mg | ORAL_TABLET | Freq: Four times a day (QID) | ORAL | Status: DC
  Administered 2021-01-25 – 2021-01-26 (×6): 1000 mg via ORAL
  Filled 2021-01-24 (×7): qty 2

## 2021-01-24 MED ORDER — HEPARIN SODIUM (PORCINE) 5000 UNIT/ML IJ SOLN: 5000.0000 [IU] | Freq: Once | INTRAMUSCULAR | Status: AC

## 2021-01-24 MED ORDER — ROCURONIUM BROMIDE 100 MG/10ML IV SOLN
INTRAVENOUS | Status: DC | PRN
Start: 2021-01-24 — End: 2021-01-24
  Administered 2021-01-24: 50 mg via INTRAVENOUS
  Administered 2021-01-24: 20 mg via INTRAVENOUS
  Administered 2021-01-24: 30 mg via INTRAVENOUS

## 2021-01-24 MED ORDER — OXYCODONE HCL 5 MG/5ML PO SOLN: 5.0000 mg | Freq: Once | ORAL | Status: DC | PRN

## 2021-01-24 MED ORDER — ONDANSETRON HCL 4 MG/2ML IJ SOLN: 4.0000 mg | Freq: Four times a day (QID) | INTRAMUSCULAR | Status: DC | PRN

## 2021-01-24 MED ORDER — OXYCODONE HCL 5 MG PO TABS: 5.0000 mg | ORAL_TABLET | Freq: Once | ORAL | Status: DC | PRN

## 2021-01-24 MED ORDER — ACETAMINOPHEN 10 MG/ML IV SOLN: INTRAVENOUS | Status: DC | PRN

## 2021-01-24 MED ORDER — PROCHLORPERAZINE MALEATE 10 MG PO TABS
10.0000 mg | ORAL_TABLET | Freq: Four times a day (QID) | ORAL | Status: DC | PRN
  Filled 2021-01-24: qty 1

## 2021-01-24 MED ORDER — KETOROLAC TROMETHAMINE 30 MG/ML IJ SOLN
30.0000 mg | Freq: Four times a day (QID) | INTRAMUSCULAR | Status: DC
  Administered 2021-01-25 – 2021-01-26 (×7): 30 mg via INTRAVENOUS
  Filled 2021-01-24 (×7): qty 1

## 2021-01-24 MED ORDER — CELECOXIB 200 MG PO CAPS: 200.0000 mg | ORAL_CAPSULE | ORAL | Status: AC

## 2021-01-24 MED ORDER — SODIUM CHLORIDE 0.9 % IV SOLN
2.0000 g | Freq: Two times a day (BID) | INTRAVENOUS | Status: AC
  Administered 2021-01-24 – 2021-01-25 (×2): 2 g via INTRAVENOUS
  Filled 2021-01-24 (×2): qty 2

## 2021-01-24 MED ORDER — PROMETHAZINE HCL 25 MG/ML IJ SOLN: 6.2500 mg | INTRAMUSCULAR | Status: DC | PRN

## 2021-01-24 MED ORDER — CELECOXIB 200 MG PO CAPS
ORAL_CAPSULE | ORAL | Status: AC
  Administered 2021-01-24: 200 mg via ORAL
  Filled 2021-01-24: qty 1

## 2021-01-24 MED ORDER — FAMOTIDINE 20 MG PO TABS: 20.0000 mg | ORAL_TABLET | Freq: Once | ORAL | Status: AC

## 2021-01-24 MED ORDER — VASOPRESSIN 20 UNIT/ML IV SOLN
INTRAVENOUS | Status: DC | PRN
  Administered 2021-01-24 (×3): 2 [IU] via INTRAVENOUS

## 2021-01-24 MED ORDER — LIDOCAINE HCL (CARDIAC) PF 100 MG/5ML IV SOSY
PREFILLED_SYRINGE | INTRAVENOUS | Status: DC | PRN
  Administered 2021-01-24: 20 mg via INTRAVENOUS
  Administered 2021-01-24: 100 mg via INTRAVENOUS
  Administered 2021-01-24: 20 mg via INTRAVENOUS

## 2021-01-24 MED ORDER — SODIUM CHLORIDE 0.9 % IV SOLN
INTRAVENOUS | Status: AC
  Filled 2021-01-24: qty 2

## 2021-01-24 MED ORDER — ACETAMINOPHEN 10 MG/ML IV SOLN: 1000.0000 mg | Freq: Once | INTRAVENOUS | Status: DC | PRN

## 2021-01-24 MED ORDER — HYDROMORPHONE HCL 1 MG/ML IJ SOLN
INTRAMUSCULAR | Status: DC | PRN
  Administered 2021-01-24: .5 mg via INTRAVENOUS

## 2021-01-24 MED ORDER — ALVIMOPAN 12 MG PO CAPS
12.0000 mg | ORAL_CAPSULE | Freq: Once | ORAL | Status: AC
  Administered 2021-01-25: 12 mg via ORAL
  Filled 2021-01-24: qty 1

## 2021-01-24 MED ORDER — BUSPIRONE HCL 5 MG PO TABS
5.0000 mg | ORAL_TABLET | Freq: Two times a day (BID) | ORAL | Status: DC
  Administered 2021-01-24 – 2021-01-26 (×4): 5 mg via ORAL
  Filled 2021-01-24 (×6): qty 1

## 2021-01-24 MED ORDER — METHOCARBAMOL 500 MG PO TABS
500.0000 mg | ORAL_TABLET | Freq: Four times a day (QID) | ORAL | Status: DC | PRN
  Administered 2021-01-25: 500 mg via ORAL
  Filled 2021-01-24: qty 1

## 2021-01-24 MED ORDER — SODIUM CHLORIDE (PF) 0.9 % IJ SOLN
INTRAMUSCULAR | Status: DC | PRN
  Administered 2021-01-24: 100 mL

## 2021-01-24 MED ORDER — ACETAMINOPHEN 500 MG PO TABS: 1000.0000 mg | ORAL_TABLET | ORAL | Status: AC

## 2021-01-24 MED ORDER — ALVIMOPAN 12 MG PO CAPS
ORAL_CAPSULE | ORAL | Status: AC
  Administered 2021-01-24: 12 mg via ORAL
  Filled 2021-01-24: qty 1

## 2021-01-24 MED ORDER — CHLORHEXIDINE GLUCONATE 0.12 % MT SOLN: 15.0000 mL | Freq: Once | OROMUCOSAL | Status: AC

## 2021-01-24 MED ORDER — GABAPENTIN 300 MG PO CAPS: 300.0000 mg | ORAL_CAPSULE | ORAL | Status: AC

## 2021-01-24 MED ORDER — BUPIVACAINE LIPOSOME 1.3 % IJ SUSP
INTRAMUSCULAR | Status: AC
  Filled 2021-01-24: qty 20

## 2021-01-24 MED ORDER — CHLORHEXIDINE GLUCONATE CLOTH 2 % EX PADS: 6.0000 | MEDICATED_PAD | Freq: Once | CUTANEOUS | Status: DC

## 2021-01-24 MED ORDER — OXYCODONE HCL 5 MG PO TABS
5.0000 mg | ORAL_TABLET | ORAL | Status: DC | PRN
  Administered 2021-01-24 – 2021-01-25 (×2): 10 mg via ORAL
  Filled 2021-01-24 (×2): qty 2

## 2021-01-24 MED ORDER — CHLORHEXIDINE GLUCONATE 0.12 % MT SOLN
OROMUCOSAL | Status: AC
  Administered 2021-01-24: 15 mL via OROMUCOSAL
  Filled 2021-01-24: qty 15

## 2021-01-24 MED ORDER — SODIUM CHLORIDE (PF) 0.9 % IJ SOLN
INTRAMUSCULAR | Status: AC
  Filled 2021-01-24: qty 50

## 2021-01-24 MED ORDER — 0.9 % SODIUM CHLORIDE (POUR BTL) OPTIME
TOPICAL | Status: DC | PRN
  Administered 2021-01-24: 100 mL
  Administered 2021-01-24: 500 mL

## 2021-01-24 MED ORDER — EPHEDRINE SULFATE 50 MG/ML IJ SOLN
INTRAMUSCULAR | Status: DC | PRN
Start: 2021-01-24 — End: 2021-01-24
  Administered 2021-01-24: 10 mg via INTRAVENOUS
  Administered 2021-01-24 (×2): 5 mg via INTRAVENOUS

## 2021-01-24 MED ORDER — SODIUM CHLORIDE 0.9 % IV SOLN
2.0000 g | INTRAVENOUS | Status: AC
  Administered 2021-01-24: 2 g via INTRAVENOUS

## 2021-01-24 MED ORDER — PANTOPRAZOLE SODIUM 40 MG IV SOLR
40.0000 mg | Freq: Every day | INTRAVENOUS | Status: DC
  Administered 2021-01-24 – 2021-01-25 (×2): 40 mg via INTRAVENOUS
  Filled 2021-01-24 (×2): qty 40

## 2021-01-24 MED ORDER — MORPHINE SULFATE (PF) 4 MG/ML IV SOLN: 4.0000 mg | INTRAVENOUS | Status: DC | PRN

## 2021-01-24 MED ORDER — FENTANYL CITRATE (PF) 100 MCG/2ML IJ SOLN: 25.0000 ug | INTRAMUSCULAR | Status: DC | PRN

## 2021-01-24 MED ORDER — HEPARIN SODIUM (PORCINE) 5000 UNIT/ML IJ SOLN
INTRAMUSCULAR | Status: AC
  Administered 2021-01-24: 5000 [IU] via SUBCUTANEOUS
  Filled 2021-01-24: qty 1

## 2021-01-24 MED ORDER — ACETAMINOPHEN 500 MG PO TABS
ORAL_TABLET | ORAL | Status: AC
  Administered 2021-01-24: 1000 mg via ORAL
  Filled 2021-01-24: qty 1

## 2021-01-24 MED ORDER — MIDAZOLAM HCL 2 MG/2ML IJ SOLN
INTRAMUSCULAR | Status: DC | PRN
  Administered 2021-01-24: 2 mg via INTRAVENOUS

## 2021-01-24 MED ORDER — FENTANYL CITRATE (PF) 100 MCG/2ML IJ SOLN
INTRAMUSCULAR | Status: DC | PRN
  Administered 2021-01-24: 50 ug via INTRAVENOUS
  Administered 2021-01-24: 100 ug via INTRAVENOUS
  Administered 2021-01-24: 50 ug via INTRAVENOUS

## 2021-01-24 MED ORDER — GABAPENTIN 400 MG PO CAPS
ORAL_CAPSULE | ORAL | Status: AC
  Administered 2021-01-24: 300 mg via ORAL
  Filled 2021-01-24: qty 1

## 2021-01-24 MED ORDER — KETAMINE HCL 10 MG/ML IJ SOLN
INTRAMUSCULAR | Status: DC | PRN
  Administered 2021-01-24: 10 mg via INTRAVENOUS
  Administered 2021-01-24: 20 mg via INTRAVENOUS

## 2021-01-24 MED ORDER — SODIUM CHLORIDE 0.9 % IV SOLN: INTRAVENOUS | Status: DC

## 2021-01-24 MED ORDER — ONDANSETRON 4 MG PO TBDP: 4.0000 mg | ORAL_TABLET | Freq: Four times a day (QID) | ORAL | Status: DC | PRN

## 2021-01-24 MED ORDER — PHENYLEPHRINE HCL-NACL 20-0.9 MG/250ML-% IV SOLN
INTRAVENOUS | Status: DC | PRN
  Administered 2021-01-24: 50 ug/min via INTRAVENOUS

## 2021-01-24 MED ORDER — ALBUMIN HUMAN 5 % IV SOLN: INTRAVENOUS | Status: DC | PRN

## 2021-01-24 MED ORDER — DEXMEDETOMIDINE (PRECEDEX) IN NS 20 MCG/5ML (4 MCG/ML) IV SYRINGE
PREFILLED_SYRINGE | INTRAVENOUS | Status: DC | PRN
  Administered 2021-01-24: 12 ug via INTRAVENOUS

## 2021-01-24 MED ORDER — FAMOTIDINE 20 MG PO TABS
ORAL_TABLET | ORAL | Status: AC
  Administered 2021-01-24: 20 mg via ORAL
  Filled 2021-01-24: qty 1

## 2021-01-24 MED ORDER — ONDANSETRON HCL 4 MG/2ML IJ SOLN
INTRAMUSCULAR | Status: DC | PRN
  Administered 2021-01-24 (×2): 4 mg via INTRAVENOUS

## 2021-01-24 MED ORDER — ALBUMIN HUMAN 25 % IV SOLN: INTRAVENOUS | Status: DC | PRN

## 2021-01-24 MED ORDER — GLYCOPYRROLATE 0.2 MG/ML IJ SOLN
INTRAMUSCULAR | Status: DC | PRN
  Administered 2021-01-24: .2 mg via INTRAVENOUS

## 2021-01-24 MED ORDER — BUPIVACAINE-EPINEPHRINE (PF) 0.5% -1:200000 IJ SOLN
INTRAMUSCULAR | Status: AC
  Filled 2021-01-24: qty 30

## 2021-01-24 MED ORDER — SERTRALINE HCL 50 MG PO TABS
200.0000 mg | ORAL_TABLET | Freq: Every day | ORAL | Status: DC
  Administered 2021-01-24 – 2021-01-26 (×3): 200 mg via ORAL
  Filled 2021-01-24 (×3): qty 4

## 2021-01-24 SURGICAL SUPPLY — 47 items
BASIN GRAD PLASTIC 32OZ STRL (MISCELLANEOUS) ×2 IMPLANT
BLADE SURG SZ10 CARB STEEL (BLADE) ×2 IMPLANT
DEFOGGER SCOPE WARMER CLEARIFY (MISCELLANEOUS) ×2 IMPLANT
DERMABOND ADVANCED (GAUZE/BANDAGES/DRESSINGS) ×2
DERMABOND ADVANCED .7 DNX12 (GAUZE/BANDAGES/DRESSINGS) ×2 IMPLANT
DRAPE INCISE IOBAN 66X45 STRL (DRAPES) ×2 IMPLANT
ELECT CAUTERY BLADE 6.4 (BLADE) ×2 IMPLANT
ELECT REM PT RETURN 9FT ADLT (ELECTROSURGICAL) ×2
ELECTRODE REM PT RTRN 9FT ADLT (ELECTROSURGICAL) ×1 IMPLANT
GLOVE SURG ENC MOIS LTX SZ7 (GLOVE) ×6 IMPLANT
GOWN STRL REUS W/ TWL LRG LVL3 (GOWN DISPOSABLE) ×4 IMPLANT
GOWN STRL REUS W/TWL LRG LVL3 (GOWN DISPOSABLE) ×4
HANDLE SUCTION POOLE (INSTRUMENTS) ×1 IMPLANT
HANDLE YANKAUER SUCT BULB TIP (MISCELLANEOUS) ×2 IMPLANT
MANIFOLD NEPTUNE II (INSTRUMENTS) ×2 IMPLANT
NEEDLE HYPO 22GX1.5 SAFETY (NEEDLE) ×2 IMPLANT
NS IRRIG 1000ML POUR BTL (IV SOLUTION) ×2 IMPLANT
PACK COLON CLEAN CLOSURE (MISCELLANEOUS) ×2 IMPLANT
PACK LAP CHOLECYSTECTOMY (MISCELLANEOUS) ×2 IMPLANT
PENCIL ELECTRO HAND CTR (MISCELLANEOUS) ×2 IMPLANT
RELOAD PROXIMATE 75MM BLUE (ENDOMECHANICALS) ×2 IMPLANT
SHEARS HARMONIC ACE PLUS 36CM (ENDOMECHANICALS) ×2 IMPLANT
SLEEVE ENDOPATH XCEL 5M (ENDOMECHANICALS) ×4 IMPLANT
SPONGE T-LAP 18X18 ~~LOC~~+RFID (SPONGE) ×6 IMPLANT
SPONGE T-LAP 18X36 ~~LOC~~+RFID STR (SPONGE) ×2 IMPLANT
STAPLER PROXIMATE 75MM BLUE (STAPLE) ×2 IMPLANT
SUCTION POOLE HANDLE (INSTRUMENTS) ×2
SURGILUBE 2OZ TUBE FLIPTOP (MISCELLANEOUS) ×2 IMPLANT
SUT MNCRL 4-0 (SUTURE) ×2
SUT MNCRL 4-0 27XMFL (SUTURE) ×2
SUT PDS AB 0 CT1 27 (SUTURE) ×4 IMPLANT
SUT SILK 2 0 (SUTURE) ×1
SUT SILK 2 0 SH CR/8 (SUTURE) ×2 IMPLANT
SUT SILK 2-0 30XBRD TIE 12 (SUTURE) ×1 IMPLANT
SUT VIC AB 3-0 SH 27 (SUTURE) ×1
SUT VIC AB 3-0 SH 27X BRD (SUTURE) ×1 IMPLANT
SUT VICRYL 0 AB UR-6 (SUTURE) ×4 IMPLANT
SUTURE MNCRL 4-0 27XMF (SUTURE) ×2 IMPLANT
SYR 30ML LL (SYRINGE) ×2 IMPLANT
SYS LAPSCP GELPORT 120MM (MISCELLANEOUS) ×2
SYSTEM LAPSCP GELPORT 120MM (MISCELLANEOUS) ×1 IMPLANT
TRAY FOLEY MTR SLVR 16FR STAT (SET/KITS/TRAYS/PACK) ×2 IMPLANT
TROCAR XCEL 12X100 BLDLESS (ENDOMECHANICALS) ×2 IMPLANT
TROCAR XCEL BLUNT TIP 100MML (ENDOMECHANICALS) ×2 IMPLANT
TROCAR XCEL NON-BLD 5MMX100MML (ENDOMECHANICALS) ×2 IMPLANT
TUBING EVAC SMOKE HEATED PNEUM (TUBING) ×2 IMPLANT
WATER STERILE IRR 500ML POUR (IV SOLUTION) ×2 IMPLANT

## 2021-01-24 NOTE — Anesthesia Postprocedure Evaluation (Signed)
Anesthesia Post Note  Patient: Joe Gutierrez  Procedure(s) Performed: LAPAROSCOPIC RIGHT COLECTOMY with Otho Ket, PA-C assisting  Patient location during evaluation: PACU Anesthesia Type: General Level of consciousness: awake and alert Pain management: pain level controlled Vital Signs Assessment: post-procedure vital signs reviewed and stable Respiratory status: spontaneous breathing, nonlabored ventilation, respiratory function stable and patient connected to nasal cannula oxygen Cardiovascular status: blood pressure returned to baseline and stable Postop Assessment: no apparent nausea or vomiting Anesthetic complications: no   No notable events documented.   Last Vitals:  Vitals:   01/24/21 2000 01/24/21 2012  BP: 131/85 138/89  Pulse: 98 98  Resp: 16 17  Temp:  37.2 C  SpO2: 95% 91%    Last Pain:  Vitals:   01/24/21 2015  TempSrc:   PainSc: 7                  Precious Haws Ben Habermann

## 2021-01-24 NOTE — Interval H&P Note (Signed)
History and Physical Interval Note:  01/24/2021 2:30 PM  Joe Gutierrez  has presented today for surgery, with the diagnosis of colon mass.  The various methods of treatment have been discussed with the patient and family. After consideration of risks, benefits and other options for treatment, the patient has consented to  Procedure(s): LAPAROSCOPIC RIGHT COLECTOMY with Otho Ket, PA-C assisting (N/A) as a surgical intervention.  The patient's history has been reviewed, patient examined, no change in status, stable for surgery.  I have reviewed the patient's chart and labs.  Questions were answered to the patient's satisfaction.     [place]

## 2021-01-24 NOTE — Transfer of Care (Signed)
Immediate Anesthesia Transfer of Care Note  Patient: Joe Gutierrez  Procedure(s) Performed: LAPAROSCOPIC RIGHT COLECTOMY with Otho Ket, PA-C assisting  Patient Location: PACU  Anesthesia Type:General  Level of Consciousness: sedated  Airway & Oxygen Therapy: Patient Spontanous Breathing and Patient connected to face mask oxygen  Post-op Assessment: Report given to RN and Post -op Vital signs reviewed and stable  Post vital signs: Reviewed  Last Vitals:  Vitals Value Taken Time  BP 102/64 01/24/21 1856  Temp    Pulse 88 01/24/21 1857  Resp    SpO2 94 % 01/24/21 1857  Vitals shown include unvalidated device data.  Last Pain:  Vitals:   01/24/21 1050  TempSrc: Oral         Complications: No notable events documented.

## 2021-01-24 NOTE — Anesthesia Procedure Notes (Signed)
Procedure Name: Intubation Date/Time: 01/24/2021 3:59 PM Performed by: Kelton Pillar, CRNA Pre-anesthesia Checklist: Patient identified, Emergency Drugs available, Suction available and Patient being monitored Patient Re-evaluated:Patient Re-evaluated prior to induction Oxygen Delivery Method: Circle system utilized Preoxygenation: Pre-oxygenation with 100% oxygen Induction Type: IV induction Ventilation: Mask ventilation without difficulty Laryngoscope Size: McGraph and 3 Grade View: Grade I Tube type: Oral Tube size: 6.5 mm Number of attempts: 1 Airway Equipment and Method: Stylet and Oral airway Placement Confirmation: ETT inserted through vocal cords under direct vision, positive ETCO2, breath sounds checked- equal and bilateral and CO2 detector Secured at: 21 cm Tube secured with: Tape Dental Injury: Teeth and Oropharynx as per pre-operative assessment

## 2021-01-24 NOTE — Op Note (Signed)
PROCEDURES: 1. Hand assisted Laparoscopic Right Hemicolectomy With stapled ileocolostomy  Pre-operative Diagnosis: Right Colon mass  Post-operative Diagnosis: Same  Surgeon: Kendale Rembold F Yoan Sallade   Assistants: Otho Ket [facility] Required due to the complexity of the case: for exposure and creation of the anastomosis  Anesthesia: General endotracheal anesthesia  ASA Class: 2  Surgeon: Caroleen Hamman , MD FACS  Anesthesia: Gen. with endotracheal tube   Findings: Cecal mass, no evidence of distant metastasis Tension free anastomosis, no evidence of intraop leak and good perfusion  Estimated Blood Loss: 20cc         Drains: none         Specimens: Right colon          Complications: none          Procedure Details  The patient was seen again in the Holding Room. The benefits, complications, treatment options, and expected outcomes were discussed with the patient. The risks of bleeding, infection, recurrence of symptoms, failure to resolve symptoms,  bowel injury, any of which could require further surgery were reviewed with the patient.   The patient was taken to Operating Room, identified and the procedure verified.  A Time Out was held and the above information confirmed.  Prior to the induction of general anesthesia, antibiotic prophylaxis was administered. VTE prophylaxis was in place. General endotracheal anesthesia was then administered and tolerated well. After the induction, the abdomen was prepped with Chloraprep and draped in the sterile fashion. The patient was positioned in the supine position.  7 cm incision was created as a midline mini laparotomy. The abdominal cavity was entered under direct visualization and the GelPort device was placed. two 5 mm ports were placed  under direct visualization and pneumoperitoneum was obtained.  The more dynamic changes were observed. The greater omentum was divided and the hepatic flexure was taken down using harmonic scalpel.  The white line  of Toldt was incised and a lateral to medial dissection was performed.  We identified the right ureter as well as the duodenum and preserve both structures at all times. Were also able to mobilize the attachments of the cecum and terminal ileum.  Once we had an adequate mobilization were able to remove the GelPort and exteriorized the right colon.  A 10 cm margin on the terminal ileum was identified and we created a window with electrocautery and divided the terminal ileum.  Attention then was turned to the distal excision margin.  We identified the middle colic artery on selected a spot right to the middle colic artery.  Were able to also use a 75 GIA stapler to divide this area.  The mesentery was scored with electrocautery.  We identified the right colic artery and suture ligated with 2 oh silks in the standard fashion.  The rest of the mesentery was divided using the harmonic scalpel.  Please note that we went as low as possible to the base of the mesentery to obtain adequate lymph nodes and adequate margins of dissection. Specimen was passed and sent to permanent pathology.  A standard side-to-side functional end to end staple anastomosis was created with multiple loads of a 75 GIA stapler device.  We check for patency as well as leak.  There was a tension-free anastomosis with good perfusion and no evidence of intraoperative leak.  The mesenteric defect was closed with a running 2-0 Vicryl in the standard fashion. We changed gloves and place a clean closure tray.   Liposomal Marcaine was injected throughout the  abdominal wall on both sides under direct visualization and palpation.  The fascia was closed with a running 0 PDS using the small bite techniques.  Incisions were closed with 4-0 Monocryl in a subcuticular fashion.  Dermabond was used to coat the skin.  Needle and laparotomy counts were correct and there were no immediate complications     Caroleen Hamman, MD, FACS

## 2021-01-24 NOTE — Anesthesia Preprocedure Evaluation (Addendum)
Anesthesia Evaluation  Patient identified by MRN, date of birth, ID band Patient awake    Reviewed: Allergy & Precautions, NPO status , Patient's Chart, lab work & pertinent test results  Airway Mallampati: II  TM Distance: >3 FB Neck ROM: full    Dental no notable dental hx.    Pulmonary sleep apnea ,    Pulmonary exam normal        Cardiovascular Exercise Tolerance: Good hypertension, Pt. on medications Normal cardiovascular exam  EKG: Normal sinus rhythm Pulmonary disease pattern Incomplete right bundle branch block Left anterior fascicular block   Neuro/Psych PSYCHIATRIC DISORDERS Anxiety negative neurological ROS     GI/Hepatic Neg liver ROS, colon mass   Endo/Other  negative endocrine ROS  Renal/GU negative Renal ROS  negative genitourinary   Musculoskeletal   Abdominal (+) + obese,   Peds  Hematology negative hematology ROS (+)   Anesthesia Other Findings Past Medical History: No date: Hypertension No date: Kidney stones No date: Obesity  Past Surgical History: No date: APPENDECTOMY No date: LITHOTRIPSY     Reproductive/Obstetrics negative OB ROS                            Anesthesia Physical  Anesthesia Plan  ASA: 2  Anesthesia Plan: General   Post-op Pain Management:    Induction:   PONV Risk Score and Plan: 2 and 3 and Treatment may vary due to age or medical condition, Ondansetron, Dexamethasone and Midazolam  Airway Management Planned: Oral ETT  Additional Equipment:   Intra-op Plan:   Post-operative Plan: Extubation in OR  Informed Consent: I have reviewed the patients History and Physical, chart, labs and discussed the procedure including the risks, benefits and alternatives for the proposed anesthesia with the patient or authorized representative who has indicated his/her understanding and acceptance.     Dental advisory given  Plan Discussed  with: Anesthesiologist, CRNA and Surgeon  Anesthesia Plan Comments:        Anesthesia Quick Evaluation

## 2021-01-25 ENCOUNTER — Other Ambulatory Visit: Payer: Self-pay

## 2021-01-25 ENCOUNTER — Encounter: Payer: Self-pay | Admitting: Surgery

## 2021-01-25 LAB — CBC
HCT: 42.6 % (ref 39.0–52.0)
Hemoglobin: 14.1 g/dL (ref 13.0–17.0)
MCH: 29 pg (ref 26.0–34.0)
MCHC: 33.1 g/dL (ref 30.0–36.0)
MCV: 87.7 fL (ref 80.0–100.0)
Platelets: 201 10*3/uL (ref 150–400)
RBC: 4.86 MIL/uL (ref 4.22–5.81)
RDW: 12 % (ref 11.5–15.5)
WBC: 14.4 10*3/uL — ABNORMAL HIGH (ref 4.0–10.5)
nRBC: 0 % (ref 0.0–0.2)

## 2021-01-25 LAB — BASIC METABOLIC PANEL
Anion gap: 9 (ref 5–15)
BUN: 17 mg/dL (ref 6–20)
CO2: 23 mmol/L (ref 22–32)
Calcium: 8.4 mg/dL — ABNORMAL LOW (ref 8.9–10.3)
Chloride: 105 mmol/L (ref 98–111)
Creatinine, Ser: 1.05 mg/dL (ref 0.61–1.24)
GFR, Estimated: >60 mL/min (ref >60)
Glucose, Bld: 174 mg/dL — ABNORMAL HIGH (ref 70–99)
Potassium: 4.2 mmol/L (ref 3.5–5.1)
Sodium: 137 mmol/L (ref 135–145)

## 2021-01-25 LAB — MRSA NEXT GEN BY PCR, NASAL: MRSA by PCR Next Gen: NOT DETECTED

## 2021-01-25 MED ORDER — CHLORHEXIDINE GLUCONATE CLOTH 2 % EX PADS
6.0000 | MEDICATED_PAD | Freq: Every day | CUTANEOUS | Status: DC
  Administered 2021-01-25: 6 via TOPICAL

## 2021-01-25 NOTE — Plan of Care (Signed)

## 2021-01-25 NOTE — Progress Notes (Signed)
[facility] Day(s): 1.   Post op day(s): 1 Day Post-Op.   Interval History:  Patient seen and examined No acute events or new complaints overnight.  Patient reports he is doing okay; some abdominal soreness; burping frequently No fever, chills, nausea, emesis He does have a leukocytosis; improving; 14.4K; likely reactive from OR Renal function normal; sCr - 1.05; UO - 270 ccs but there is 400 ccs in bag at time of my evaluation No significant electrolyte derangements  He is on full liquids  He is passing some flatus  Vital signs in last 24 hours: [min-max] current  Temp:  [97.6 F (36.4 C)-99 F (37.2 C)] 98.2 F (36.8 C) (12/09 0259) Pulse Rate:  [78-103] 99 (12/09 0259) Resp:  [9-21] 16 (12/09 0259) BP: (102-143)/(64-95) 138/95 (12/09 0259) SpO2:  [91 %-100 %] 93 % (12/09 0259) Weight:  [108.9 kg] 108.9 kg (12/08 1050)     Height: 5\' 10"  (177.8 cm) Weight: 108.9 kg BMI (Calculated): 34.44   Intake/Output last 2 shifts:  12/08 0701 - 12/09 0700 In: 2700 [I.V.:2000; IV Piggyback:700] Out: 320 [Urine:270; Blood:50]   Physical Exam:  Constitutional: alert, cooperative and no distress  Respiratory: breathing non-labored at rest  Cardiovascular: regular rate and sinus rhythm  Gastrointestinal: Soft, incisional soreness, non-distended, no rebound/guarding Genitourinary: Foley in place  Integumentary: Laparoscopic incisions are CDI with dermabond, no erythema or drainage   Labs:  CBC Latest Ref Rng & Units 01/25/2021 01/24/2021 11/26/2020  WBC 4.0 - 10.5 K/uL 14.4(H) 16.8(H) 7.2  Hemoglobin 13.0 - 17.0 g/dL 14.1 14.2 15.3  Hematocrit 39.0 - 52.0 % 42.6 42.8 46.7  Platelets 150 - 400 K/uL 201 193 263   CMP Latest Ref Rng & Units 01/25/2021 01/24/2021 11/26/2020  Glucose 70 - 99 mg/dL 174(H) - 87  BUN 6 - 20 mg/dL 17 - 15  Creatinine 0.61 - 1.24 mg/dL 1.05 1.12 0.89  Sodium 135 - 145 mmol/L 137 - 141  Potassium 3.5 - 5.1  mmol/L 4.2 - 4.3  Chloride 98 - 111 mmol/L 105 - 102  CO2 22 - 32 mmol/L 23 - 22  Calcium 8.9 - 10.3 mg/dL 8.4(L) - 9.0  Total Protein 6.0 - 8.5 g/dL - - 7.0  Total Bilirubin 0.0 - 1.2 mg/dL - - 0.4  Alkaline Phos 44 - 121 IU/L - - 88  AST 0 - 40 IU/L - - 16  ALT 0 - 44 IU/L - - 14     Imaging studies: No new pertinent imaging studies   Assessment/Plan:  45 y.o. male 1 Day Post-Op s/p hand-assisted laparoscopic right hemicolectomy with ileocolostomy for right colon mass   - Okay to continue full liquids; advance as tolerated as bowel function returns  - Wean from IVF - Complete peri-operative Abx - Discontinue foley catheter - Monitor abdominal examination on-going bowel function  - Pain control prn; antiemetics prn   - Mobilization as toerlated   All of the above findings and recommendations were discussed with the patient, patient's family (wife at bedside), and the medical team, and all of patient's and family's questions were answered to their expressed satisfaction.  -- Edison Simon, PA-C [facility] 01/25/2021, 7:33 AM [phone] M-F: 7am - 4pm

## 2021-01-26 LAB — CBC
HCT: 36.1 % — ABNORMAL LOW (ref 39.0–52.0)
Hemoglobin: 12 g/dL — ABNORMAL LOW (ref 13.0–17.0)
MCH: 29.4 pg (ref 26.0–34.0)
MCHC: 33.2 g/dL (ref 30.0–36.0)
MCV: 88.5 fL (ref 80.0–100.0)
Platelets: 163 10*3/uL (ref 150–400)
RBC: 4.08 MIL/uL — ABNORMAL LOW (ref 4.22–5.81)
RDW: 12.2 % (ref 11.5–15.5)
WBC: 11 10*3/uL — ABNORMAL HIGH (ref 4.0–10.5)
nRBC: 0 % (ref 0.0–0.2)

## 2021-01-26 MED ORDER — PREGABALIN 50 MG PO CAPS: 50.0000 mg | ORAL_CAPSULE | Freq: Three times a day (TID) | ORAL | 0 refills | Status: DC

## 2021-01-26 MED ORDER — OXYCODONE HCL 5 MG PO TABS: 5.0000 mg | ORAL_TABLET | ORAL | 0 refills | Status: DC | PRN

## 2021-01-26 MED ORDER — LISINOPRIL 20 MG PO TABS
40.0000 mg | ORAL_TABLET | Freq: Every day | ORAL | Status: DC
  Administered 2021-01-26: 40 mg via ORAL
  Filled 2021-01-26: qty 2

## 2021-01-26 NOTE — Progress Notes (Signed)
Patient discharged to home, AVS reviewed with the patient and he confirmed his prescriptions were sent to the correct pharmacy and he has all of his belongings. He will call and make f/u appointments. His wife provided transportation.

## 2021-01-26 NOTE — Discharge Summary (Signed)
Physician Discharge Summary  Patient ID: Joe Gutierrez MRN: [id number] DOB/AGE: [birth year] 45 y.o.  Admit date: 01/24/2021 Discharge date: 01/26/2021  Admission Diagnoses:  Discharge Diagnoses:  Principal Problem:   S/P laparoscopic colectomy   Discharged Condition: good  Hospital Course: Admitted postoperatively following hand-assisted laparoscopic right hemicolectomy.  Return of bowel function with advancement of diet tolerated.  Pain well controlled.  No postoperative fevers or chills.  White blood cell count trending down to 11 with stable hemoglobin at 12.0.  Reports bowel movements without blood, reports flatus.  Consults: None  Significant Diagnostic Studies: Surgical pathology pending.  Treatments: surgery: Hand-assisted laparoscopic right hemicolectomy for cecal mass.    Discharge Exam: Blood pressure (!) 145/100, pulse 66, temperature 98.5 F (36.9 C), temperature source Oral, resp. rate 16, height 5\' 10"  (1.778 m), weight 108.9 kg, SpO2 96 %. General appearance: Alert, nontoxic, appears well. Resp: clear to auscultation bilaterally GI: soft, non-tender; bowel sounds normal; no masses,  no organomegaly Extremities: extremities normal, atraumatic, no cyanosis or edema Incision/Wound: Clean dry and intact with Dermabond intact.  Disposition: Discharge disposition: 01-Home or Self Care      Discharge Instructions     Call MD for:  persistant nausea and vomiting   Complete by: As directed    Call MD for:  redness, tenderness, or signs of infection (pain, swelling, redness, odor or green/yellow discharge around incision site)   Complete by: As directed    Call MD for:  severe uncontrolled pain   Complete by: As directed    Diet - low sodium heart healthy   Complete by: As directed    Discharge wound care:   Complete by: As directed    Your incision was closed with Dermabond.  It is best to keep it clean and dry, it will tolerate a brief shower, but do not soak  it or apply any creams or lotions to the incisions.  The Dermabond should gradually flake off over time.  Keep it open to air so you can evaluate your incisions.  Dermabond assists the underlying sutures to keep your incision closed and protected from infection.  Should you develop some drainage from your incision, some drops of drainage would be okay but if it persists continue to put keep a dry dressing over it.   Driving Restrictions   Complete by: As directed    No driving until cleared after follow-up appointment.  Is not advised to drive while taking narcotic pain medications or in significant pain.   Increase activity slowly   Complete by: As directed    Lifting restrictions   Complete by: As directed    Strongly advised against any form of lifting greater than 15 pounds over the next 4 to 6 weeks.  This involves pushing/pulling movements as well.  After 4 weeks when may gradually engage in more activities remaining aware of any new pain/tenderness elicited, and avoiding those for the full duration of 6 weeks.  Walking is encouraged.  Climbing stairs with caution.      Allergies as of 01/26/2021       Reactions   Codeine Hives   Sulfa Antibiotics Hives        Medication List     TAKE these medications    acetaminophen 500 MG tablet Commonly known as: TYLENOL Take 500 mg by mouth every 6 (six) hours as needed.   busPIRone 5 MG tablet Commonly known as: BUSPAR Take 1 tablet (5 mg total) by mouth 2 (two) times  daily.   lisinopril 40 MG tablet Commonly known as: ZESTRIL TAKE 1 TABLET(40 MG) BY MOUTH DAILY   oxyCODONE 5 MG immediate release tablet Commonly known as: Oxy IR/ROXICODONE Take 1-2 tablets (5-10 mg total) by mouth every 4 (four) hours as needed for moderate pain.   pregabalin 50 MG capsule Commonly known as: LYRICA Take 1 capsule (50 mg total) by mouth 3 (three) times daily.   sertraline 100 MG tablet Commonly known as: ZOLOFT Take 2 tablets (200 mg total)  by mouth daily.   sildenafil 50 MG tablet Commonly known as: VIAGRA Take 1 tablet (50 mg total) by mouth daily as needed for erectile dysfunction.   Vitamin D (Cholecalciferol) 25 MCG (1000 UT) Tabs Take 1,000 Units by mouth daily.               Discharge Care Instructions  (From admission, onward)           Start     Ordered   01/26/21 0000  Discharge wound care:       Comments: Your incision was closed with Dermabond.  It is best to keep it clean and dry, it will tolerate a brief shower, but do not soak it or apply any creams or lotions to the incisions.  The Dermabond should gradually flake off over time.  Keep it open to air so you can evaluate your incisions.  Dermabond assists the underlying sutures to keep your incision closed and protected from infection.  Should you develop some drainage from your incision, some drops of drainage would be okay but if it persists continue to put keep a dry dressing over it.   01/26/21 1256            Follow-up Information     Pabon, [place] F, MD. Schedule an appointment as soon as possible for a visit in 1 week(s).   Specialty: General Surgery Contact information: [address] [place] [address] [phone]                 Signed: Ronny Bacon, M.D., [facility] 01/26/2021, 1:02 PM

## 2021-01-28 LAB — SURGICAL PATHOLOGY

## 2021-02-04 ENCOUNTER — Other Ambulatory Visit: Payer: Self-pay

## 2021-02-04 ENCOUNTER — Encounter: Payer: Self-pay | Admitting: Surgery

## 2021-02-04 ENCOUNTER — Ambulatory Visit (INDEPENDENT_AMBULATORY_CARE_PROVIDER_SITE_OTHER): Payer: 59 | Admitting: Surgery

## 2021-02-04 VITALS — BP 114/79 | HR 66 | Temp 98.3°F | Ht 70.0 in | Wt 239.0 lb

## 2021-02-04 DIAGNOSIS — Z09 Encounter for follow-up examination after completed treatment for conditions other than malignant neoplasm: Secondary | ICD-10-CM

## 2021-02-04 NOTE — Patient Instructions (Signed)
You may return to work on Monday with lifting restrictions for 6 weeks total after surgery.  Follow up here in 3 months.   GENERAL POST-OPERATIVE PATIENT INSTRUCTIONS   WOUND CARE INSTRUCTIONS:  Try to keep the wound dry and avoid ointments on the wound unless directed to do so.  If the wound becomes bright red and painful or starts to drain infected material that is not clear, please contact your physician immediately.  If the wound is mildly pink and has a thick firm ridge underneath it, this is normal, and is referred to as a healing ridge.  This will resolve over the next 4-6 weeks.  BATHING: You may shower if you have been informed of this by your surgeon. However, Please do not submerge in a tub, hot tub, or pool until incisions are completely sealed or have been told by your surgeon that you may do so.  DIET:  You may eat any foods that you can tolerate.  It is a good idea to eat a high fiber diet and take in plenty of fluids to prevent constipation.  If you do become constipated you may want to take a mild laxative or take ducolax tablets on a daily basis until your bowel habits are regular.  Constipation can be very uncomfortable, along with straining, after recent surgery.  ACTIVITY: You may want to hug a pillow when coughing and sneezing to add additional support to the surgical area, if you had abdominal or chest surgery, which will decrease pain during these times.  You are encouraged to walk and engage in light activity for the next two weeks.  You should not lift more than 20 pounds for 4-6 weeks after surgery as it could put you at increased risk for complications.  Twenty pounds is roughly equivalent to a plastic bag of groceries. At that time- Listen to your body when lifting, if you have pain when lifting, stop and then try again in a few days. Soreness after doing exercises or activities of daily living is normal as you get back in to your normal routine.  MEDICATIONS:  Try to  take narcotic medications and anti-inflammatory medications, such as tylenol, ibuprofen, naprosyn, etc., with food.  This will minimize stomach upset from the medication.  Should you develop nausea and vomiting from the pain medication, or develop a rash, please discontinue the medication and contact your physician.  You should not drive, make important decisions, or operate machinery when taking narcotic pain medication.  SUNBLOCK Use sun block to incision area over the next year if this area will be exposed to sun. This helps decrease scarring and will allow you avoid a permanent darkened area over your incision.  QUESTIONS:  Please feel free to call our office if you have any questions, and we will be glad to assist you.

## 2021-02-05 NOTE — Progress Notes (Signed)
Joe Gutierrez is a 45 year old male status post laparoscopic right colectomy.  Pathology discussed with him in detail.  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  Is doing very well.  Tolerating diet and having no pain ambulating and having regular bowel movements.  PE NAD Abd: soft, nt, patient is healing well without evidence of infection   A/P doing very well without complications. Turn to clinic in 3 months.  No need for any further therapies at this time

## 2021-02-12 ENCOUNTER — Ambulatory Visit
Admission: RE | Admit: 2021-02-12 | Discharge: 2021-02-12 | Disposition: A | Discharge status: Home / Self Care | Payer: Self-pay | Source: Ambulatory Visit | Attending: Physician Assistant | Admitting: Physician Assistant

## 2021-02-12 ENCOUNTER — Other Ambulatory Visit: Payer: Self-pay | Admitting: Physician Assistant

## 2021-02-12 ENCOUNTER — Other Ambulatory Visit: Payer: Self-pay

## 2021-02-12 ENCOUNTER — Telehealth: Payer: Self-pay

## 2021-02-12 DIAGNOSIS — M7989 Other specified soft tissue disorders: Secondary | ICD-10-CM | POA: Insufficient documentation

## 2021-02-12 NOTE — Progress Notes (Signed)
Patient called reporting swelling and firmness in his LUE from where he had his IV during his admission. He is 19 days s/p hand assisted right hemicolectomy with Dr Dahlia Byes.   This does sound consistent with thrombophlebitis and encouraged him to use warm compresses and elevate his arm. As a precaution, I will get [place] of this extremity to ensure no DVT.   -- Edison Simon, PA-C [facility] 02/12/2021, 10:53 AM [phone] M-F: 7am - 4pm

## 2021-02-12 NOTE — Telephone Encounter (Signed)
Per Thedore Mins -Ultrasound results -no DVT-keep warm compresses and elevate arm.

## 2021-02-12 NOTE — Telephone Encounter (Signed)
Anderson Malta from Radiology called on stat Ultrasound results-notified Zach.

## 2021-02-12 NOTE — Telephone Encounter (Signed)
Ultrasound scheduled Left arm extremity ASAP today- apply warm compresses    Patient instructed to go directly to [facility] for stat Ultrasound. Thedore Mins will notify the patient with results.

## 2021-05-06 ENCOUNTER — Other Ambulatory Visit: Payer: Self-pay

## 2021-05-06 ENCOUNTER — Ambulatory Visit (INDEPENDENT_AMBULATORY_CARE_PROVIDER_SITE_OTHER): Payer: Managed Care, Other (non HMO) | Admitting: Surgery

## 2021-05-06 ENCOUNTER — Encounter: Payer: Self-pay | Admitting: Surgery

## 2021-05-06 VITALS — BP 149/88 | HR 83 | Temp 98.3°F | Ht 70.0 in | Wt 261.6 lb

## 2021-05-06 DIAGNOSIS — D120 Benign neoplasm of cecum: Secondary | ICD-10-CM

## 2021-05-06 DIAGNOSIS — Z9889 Other specified postprocedural states: Secondary | ICD-10-CM

## 2021-05-06 NOTE — Progress Notes (Signed)
Outpatient Surgical Follow Up ? ?05/06/2021 ? ?Joe Gutierrez is an 46 y.o. male.  ? ?Chief Complaint  ?Patient presents with  ? Follow-up  ?  Right colectomy 01/24/21  ? ? ?HPI: Joe Gutierrez is a 46 year old male status post laparoscopic right colectomy 01/24/21.  Pathology discussed with him in detail.  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  ?He Is doing very well.  Tolerating diet and having no pain ambulating and having regular bowel movements. ? ? ?Past Medical History:  ?Diagnosis Date  ? Anxiety   ? Arthritis   ? Cancer [facility])   ? Depression   ? Heart murmur   ? History of kidney stones   ? Hypertension   ? Kidney stones   ? Obesity   ? Sleep apnea   ? ? ?Past Surgical History:  ?Procedure Laterality Date  ? APPENDECTOMY    ? COLONOSCOPY WITH PROPOFOL N/A 11/16/2020  ? Procedure: COLONOSCOPY WITH PROPOFOL;  Surgeon: [name], MD;  Location: [facility];  Service: Gastroenterology;  Laterality: N/A;  ? LAPAROSCOPIC RIGHT COLECTOMY N/A 01/24/2021  ? Procedure: LAPAROSCOPIC RIGHT COLECTOMY with Otho Ket, PA-C assisting;  Surgeon: Jules Husbands, MD;  Location: [facility] ORS;  Service: General;  Laterality: N/A;  ? LITHOTRIPSY    ? ? ?Family History  ?Problem Relation Age of Onset  ? Diabetes Mother   ? Hypertension Mother   ? Alcohol abuse Father   ? Heart disease Father   ? Hypertension Father   ? Diabetes Maternal Grandmother   ? Stroke Maternal Grandfather   ? Hypertension Brother   ? Asthma Daughter   ? Heart disease Paternal Grandfather   ? ? ?Social History:  reports that he has never smoked. He has never used smokeless tobacco. He reports that he does not drink alcohol and does not use drugs. ? ?Allergies:  ?Allergies  ?Allergen Reactions  ? Codeine Hives  ? Sulfa Antibiotics Hives  ? ? ?Medications reviewed. ? ? ? ?ROS ?Full ROS performed and is otherwise negative other than what is stated in HPI ? ? ?BP (!) 149/88   Pulse 83    Temp 98.3 ?F (36.8 ?C) (Oral)   Ht '5\' 10"'$  (1.778 m)   Wt 261 lb 9.6 oz (118.7 kg)   SpO2 99%   BMI 37.54 kg/m?  ? ?Physical Exam ?Vitals and nursing note reviewed. Exam conducted with a chaperone present.  ?Constitutional:   ?   Appearance: Normal appearance.  ?Cardiovascular:  ?   Rate and Rhythm: Normal rate and regular rhythm.  ?   Heart sounds:  ?  No gallop.  ?Pulmonary:  ?   Effort: Pulmonary effort is normal. No respiratory distress.  ?   Breath sounds: Normal breath sounds. No stridor. No wheezing.  ?Abdominal:  ?   General: Abdomen is flat. There is no distension.  ?   Palpations: Abdomen is soft. There is no mass.  ?   Tenderness: There is no abdominal tenderness. There is no guarding or rebound.  ?   Hernia: No hernia is present.  ?Musculoskeletal:     ?   General: No swelling or tenderness. Normal range of motion.  ?Skin: ?   General: Skin is warm and dry.  ?   Capillary Refill: Capillary refill takes less than 2 seconds.  ?Neurological:  ?   General: No focal deficit present.  ?   Mental Status: He is alert and oriented to  person, place, and time.  ?Psychiatric:     ?   Mood and Affect: Mood normal.     ?   Behavior: Behavior normal.     ?   Thought Content: Thought content normal.     ?   Judgment: Judgment normal.  ? ? ?   ?Assessment/Plan: ?46 year old male status post right colectomy for benign colon polyp.  No complications and doing very well.  He will need a colonoscopy on 2025.  We will make appropriate referral for GI.  No surgical issues at this time.  Please note that I spent 20 minutes in this encounter including coordination of his care and performing appropriate documentation ? ? ? ?Caroleen Hamman, MD FACS ?General Surgeon  ?

## 2021-05-06 NOTE — Patient Instructions (Signed)
If you have any concerns or question, please feel free to call our office. Follow up as needed.  ?

## 2021-05-07 ENCOUNTER — Telehealth: Payer: Self-pay

## 2021-05-07 NOTE — Telephone Encounter (Signed)
Pt returned call left message ?

## 2021-05-07 NOTE — Telephone Encounter (Signed)
CALLED PATIENT NO ANSWER LEFT VOICEMAIL FOR A CALL BACK ? ?

## 2021-05-08 ENCOUNTER — Other Ambulatory Visit: Payer: Self-pay

## 2021-05-08 NOTE — Progress Notes (Signed)
Had referral in system called patient to schedule he is not due till 2025 for next colonoscopy  ?

## 2021-05-08 NOTE — Progress Notes (Signed)
Gastroenterology Pre-Procedure Review ? ?Request Date: n/a ?Requesting Physician: Dr. Durward Parcel ? ?PATIENT REVIEW QUESTIONS: The patient responded to the following health history questions as indicated:   ? ?1. Are you having any GI issues? no ?2. Do you have a personal history of Polyps? yes (last colonoscopy) ?3. Do you have a family history of Colon Cancer or Polyps? no ?4. Diabetes Mellitus? no ?5. Joint replacements in the past 12 months?no ?6. Major health problems in the past 3 months?no ?7. Any artificial heart valves, MVP, or defibrillator?no ?   ?MEDICATIONS & ALLERGIES:    ?Patient reports the following regarding taking any anticoagulation/antiplatelet therapy:   ?Plavix, Coumadin, Eliquis, Xarelto, Lovenox, Pradaxa, Brilinta, or Effient? no ?Aspirin? no ? ?Patient confirms/reports the following medications:  ?Current Outpatient Medications  ?Medication Sig Dispense Refill  ? acetaminophen (TYLENOL) 500 MG tablet Take 500 mg by mouth every 6 (six) hours as needed.    ? busPIRone (BUSPAR) 5 MG tablet Take 1 tablet (5 mg total) by mouth 2 (two) times daily. 180 tablet 4  ? lisinopril (ZESTRIL) 40 MG tablet TAKE 1 TABLET(40 MG) BY MOUTH DAILY 90 tablet 4  ? pregabalin (LYRICA) 50 MG capsule Take 1 capsule (50 mg total) by mouth 3 (three) times daily. 30 capsule 0  ? sertraline (ZOLOFT) 100 MG tablet Take 2 tablets (200 mg total) by mouth daily. 180 tablet 4  ? sildenafil (VIAGRA) 50 MG tablet Take 1 tablet (50 mg total) by mouth daily as needed for erectile dysfunction. 10 tablet 0  ? Vitamin D, Cholecalciferol, 25 MCG (1000 UT) TABS Take 1,000 Units by mouth daily.    ? ?No current facility-administered medications for this visit.  ? ? ?Patient confirms/reports the following allergies:  ?Allergies  ?Allergen Reactions  ? Codeine Hives  ? Sulfa Antibiotics Hives  ? ? ?No orders of the defined types were placed in this encounter. ? ? ?AUTHORIZATION INFORMATION ?Primary Insurance: ?1D#: ?Group #: ? ?Secondary  Insurance: ?1D#: ?Group #: ? ?SCHEDULE INFORMATION: ?Date: n/a ?Time: ?Location:n/a ? ?

## 2021-05-25 DIAGNOSIS — Z6740 Type O blood, Rh positive: Secondary | ICD-10-CM | POA: Insufficient documentation

## 2021-05-25 NOTE — Patient Instructions (Signed)

## 2021-05-27 ENCOUNTER — Ambulatory Visit: Payer: Self-pay | Admitting: Nurse Practitioner

## 2021-05-29 ENCOUNTER — Ambulatory Visit (INDEPENDENT_AMBULATORY_CARE_PROVIDER_SITE_OTHER): Payer: Managed Care, Other (non HMO) | Admitting: Nurse Practitioner

## 2021-05-29 ENCOUNTER — Encounter: Payer: Self-pay | Admitting: Nurse Practitioner

## 2021-05-29 VITALS — BP 135/85 | HR 66 | Temp 98.1°F | Ht 70.0 in | Wt 260.4 lb

## 2021-05-29 DIAGNOSIS — F418 Other specified anxiety disorders: Secondary | ICD-10-CM

## 2021-05-29 DIAGNOSIS — D649 Anemia, unspecified: Secondary | ICD-10-CM

## 2021-05-29 DIAGNOSIS — I10 Essential (primary) hypertension: Secondary | ICD-10-CM

## 2021-05-29 DIAGNOSIS — R7301 Impaired fasting glucose: Secondary | ICD-10-CM | POA: Diagnosis not present

## 2021-05-29 DIAGNOSIS — E6609 Other obesity due to excess calories: Secondary | ICD-10-CM

## 2021-05-29 DIAGNOSIS — Z6835 Body mass index (BMI) 35.0-35.9, adult: Secondary | ICD-10-CM

## 2021-05-29 DIAGNOSIS — R200 Anesthesia of skin: Secondary | ICD-10-CM | POA: Insufficient documentation

## 2021-05-29 DIAGNOSIS — E7800 Pure hypercholesterolemia, unspecified: Secondary | ICD-10-CM | POA: Diagnosis not present

## 2021-05-29 DIAGNOSIS — E559 Vitamin D deficiency, unspecified: Secondary | ICD-10-CM

## 2021-05-29 DIAGNOSIS — G4733 Obstructive sleep apnea (adult) (pediatric): Secondary | ICD-10-CM

## 2021-05-29 DIAGNOSIS — R7989 Other specified abnormal findings of blood chemistry: Secondary | ICD-10-CM

## 2021-05-29 LAB — MICROALBUMIN, URINE WAIVED
Creatinine, Urine Waived: 100 mg/dL (ref 10–300)
Microalb, Ur Waived: 10 mg/L (ref 0–19)
Microalb/Creat Ratio: <30 mg/g (ref <30)

## 2021-05-29 LAB — BAYER DCA HB A1C WAIVED: HB A1C (BAYER DCA - WAIVED): 5.1 % (ref 4.8–5.6)

## 2021-05-29 MED ORDER — BUPROPION HCL ER (XL) 150 MG PO TB24: 150.0000 mg | ORAL_TABLET | Freq: Every day | ORAL | 4 refills | Status: DC

## 2021-05-29 MED ORDER — METHYLPREDNISOLONE 4 MG PO TBPK: ORAL_TABLET | ORAL | 0 refills | Status: DC

## 2021-05-29 NOTE — Assessment & Plan Note (Addendum)
Chronic, ongoing.  PHQ9 = 20 and GAD7 = 15 today. Continue Sertraline to 200 MG (max dose) and Buspar 5 MG BID, add on Wellbutrin XL 150 MG daily, Wellbutrin may benefit motivation and thus decreased depression and anxiety -- have discussed with him can increase Buspar to 10 MG BID as needed too.  Denies SI/HI. Return in 6 weeks.  Recheck Testosterone today. ?

## 2021-05-29 NOTE — Progress Notes (Signed)
? ?BP 135/85   Pulse 66   Temp 98.1 ?F (36.7 ?C) (Oral)   Ht '5\' 10"'$  (1.778 m)   Wt 260 lb 6.4 oz (118.1 kg)   SpO2 96%   BMI 37.36 kg/m?   ? ?Subjective:  ? ? Patient ID: Joe Gutierrez, male    DOB: 03-21-1975, 46 y.o.   MRN: [id number] ? ?HPI: ?Joe Gutierrez is a 46 y.o. male ? ?Chief Complaint  ?Patient presents with  ? Mood  ? Hypertension  ? Hyperlipidemia  ? Numbness  ?  Patient states he is noticing numbness and tingling in his right thumb that radiates up his arm into right side of his face. Patient states he first noticed it about a week ago.   ? ?HYPERTENSION ?Continues on Lisinopril 40 MG daily -- has not taken medication this morning. No current statin. ?Hypertension status: stable  ?Satisfied with current treatment? yes ?Duration of hypertension: chronic ?BP monitoring frequency: not checking ?BP range: ?BP medication side effects:  no ?Medication compliance: good compliance ?Aspirin: no ?Recurrent headaches: no ?Visual changes: no ?Palpitations: no ?Dyspnea: no ?Chest pain: occasional -- once or twice a week, having increased anxiety recently ?Lower extremity edema: no ?Dizzy/lightheaded: on occasion ?The 10-year ASCVD risk score (Arnett DK, et al., 2019) is: 3.5% ?  Values used to calculate the score: ?    Age: 89 years ?    Sex: Male ?    Is Non-Hispanic African American: No ?    Diabetic: No ?    Tobacco smoker: No ?    Systolic Blood Pressure: 505 mmHg ?    Is BP treated: Yes ?    HDL Cholesterol: 43 mg/dL ?    Total Cholesterol: 213 mg/dL ? ?DEPRESSION ?Continues on Sertraline 200 MG daily + Buspar 5 BID.  Takes Vitamin D for history low levels.  Last level 32.1. ? ?Recently is having increased anxiety, everything is getting to him lately.  Mostly work, but in general -- has a lot shorter fuse.  Is having occasional panic attacks.  Having a hard time getting tasks done. ?Mood status: stable ?Satisfied with current treatment?: yes ?Symptom severity: mild  ?Duration of current treatment :  chronic ?Side effects: no ?Medication compliance: good compliance ?Psychotherapy/counseling: none ?Previous psychiatric medications: Paxil and Buspar ?Depressed mood: yes ?Anxious mood: yes ?Anhedonia: yes ?Significant weight loss or gain: no ?Insomnia: yes, about 5 hours of sleep ?Fatigue: yes ?Feelings of worthlessness or guilt: a little bit ?Impaired concentration/indecisiveness: yes ?Suicidal ideations: no ?Hopelessness: yes ?Crying spells: no  ? ?  05/29/2021  ?  9:49 AM 11/26/2020  ?  8:26 AM 10/01/2020  ?  2:35 PM 08/01/2020  ?  3:33 PM 06/18/2020  ? 11:47 AM  ?Depression screen PHQ 2/9  ?Decreased Interest 2 0 '1 1 3  '$ ?Down, Depressed, Hopeless 2 0 0 1 2  ?PHQ - 2 Score 4 0 '1 2 5  '$ ?Altered sleeping 3 0 0 2 1  ?Tired, decreased energy 3 1 0 0 2  ?Change in appetite 3 0 0 1 1  ?Feeling bad or failure about yourself  2 0 0 0 2  ?Trouble concentrating '2 1 1 1 2  '$ ?Moving slowly or fidgety/restless 2 0 0 0 0  ?Suicidal thoughts 1 0 0 0 0  ?PHQ-9 Score '20 2 2 6 13  '$ ?Difficult doing work/chores  Not difficult at all Not difficult at all Somewhat difficult Somewhat difficult  ?  ? ?  05/29/2021  ?  9:49 AM 10/01/2020  ?  2:09 PM 08/01/2020  ?  3:58 PM 05/02/2019  ?  8:59 AM  ?GAD 7 : Generalized Anxiety Score  ?Nervous, Anxious, on Edge 3 0 1 0  ?Control/stop worrying 2 0 1 1  ?Worry too much - different things 2 0 1 0  ?Trouble relaxing '2 1 1 1  '$ ?Restless 2 0 1 1  ?Easily annoyed or irritable '3 1 1 1  '$ ?Afraid - awful might happen 1 0 1 0  ?Total GAD 7 Score '15 2 7 4  '$ ?Anxiety Difficulty Very difficult Not difficult at all Somewhat difficult Not difficult at all  ? ?NUMBNESS ?Right thumb and up to shoulder.  Did have a crick in right side of neck a couple weeks ago before this presented.   ?Duration: weeks ?Onset: sudden ?Location: right side ?Bilateral: no ?Symmetric: no ?Decreased sensation: no  ?Weakness: no ?Pain: no ?Quality:  numbness ?Severity: mild  ?Frequency: constant ?Trauma: no ?Recent illness: no ?Diabetes:  none ?Thyroid disease: no  ?HIV: no  ?Alcoholism: no  ?Spinal cord injury: no ?Alleviating factors: nothing ?Aggravating factors: increased use ?Status: stable ?Treatments attempted:  nothing used or taken. ? ?Relevant past medical, surgical, family and social history reviewed and updated as indicated. Interim medical history since our last visit reviewed. ?Allergies and medications reviewed and updated. ? ?Review of Systems  ?Constitutional:  Negative for activity change, diaphoresis, fatigue and fever.  ?Respiratory:  Negative for cough, chest tightness, shortness of breath and wheezing.   ?Cardiovascular:  Negative for chest pain, palpitations and leg swelling.  ?Gastrointestinal: Negative.   ?Endocrine: Negative.   ?Neurological:  Positive for numbness. Negative for dizziness, tremors, syncope, facial asymmetry, weakness and headaches.  ?Psychiatric/Behavioral:  Positive for decreased concentration and sleep disturbance. Negative for self-injury and suicidal ideas. The patient is nervous/anxious.   ? ?Per HPI unless specifically indicated above ? ?   ?Objective:  ?  ?BP 135/85   Pulse 66   Temp 98.1 ?F (36.7 ?C) (Oral)   Ht '5\' 10"'$  (1.778 m)   Wt 260 lb 6.4 oz (118.1 kg)   SpO2 96%   BMI 37.36 kg/m?   ?Wt Readings from Last 3 Encounters:  ?05/29/21 260 lb 6.4 oz (118.1 kg)  ?05/06/21 261 lb 9.6 oz (118.7 kg)  ?02/04/21 239 lb (108.4 kg)  ?  ?Physical Exam ?Vitals and nursing note reviewed.  ?Constitutional:   ?   General: He is awake. He is not in acute distress. ?   Appearance: He is well-developed, well-groomed and overweight. He is not ill-appearing.  ?HENT:  ?   Head: Normocephalic and atraumatic.  ?   Right Ear: Hearing normal. No drainage.  ?   Left Ear: Hearing normal. No drainage.  ?Eyes:  ?   General: Lids are normal.     ?   Right eye: No discharge.     ?   Left eye: No discharge.  ?   Conjunctiva/sclera: Conjunctivae normal.  ?   Pupils: Pupils are equal, round, and reactive to light.  ?Neck:  ?    Vascular: No carotid bruit.  ?Cardiovascular:  ?   Rate and Rhythm: Normal rate and regular rhythm.  ?   Heart sounds: Normal heart sounds, S1 normal and S2 normal. No murmur heard. ?  No gallop.  ?Pulmonary:  ?   Effort: Pulmonary effort is normal. No accessory muscle usage or respiratory distress.  ?   Breath sounds: Normal breath sounds.  ?Abdominal:  ?  General: Bowel sounds are normal.  ?   Palpations: Abdomen is soft.  ?Musculoskeletal:  ?   Cervical back: Normal range of motion and neck supple.  ?   Right knee: Normal.  ?   Left knee: Normal.  ?   Right lower leg: No edema.  ?   Left lower leg: No edema.  ?Skin: ?   General: Skin is warm and dry.  ?Neurological:  ?   Mental Status: He is alert and oriented to person, place, and time.  ?   Cranial Nerves: Cranial nerves 2-12 are intact.  ?   Motor: Motor function is intact.  ?   Coordination: Coordination is intact.  ?   Gait: Gait is intact.  ?   Deep Tendon Reflexes: Reflexes are normal and symmetric.  ?   Comments: Negative Tinel and Phalen.  ?Psychiatric:     ?   Attention and Perception: Attention normal.     ?   Mood and Affect: Mood normal.     ?   Speech: Speech normal.     ?   Behavior: Behavior normal. Behavior is cooperative.  ? ? ?Results for orders placed or performed during the hospital encounter of 01/24/21  ?MRSA Next Gen by PCR, Nasal  ? Specimen: Nasal Mucosa; Nasal Swab  ?Result Value Ref Range  ? MRSA by PCR Next Gen NOT DETECTED NOT DETECTED  ?CBC  ?Result Value Ref Range  ? WBC 16.8 (H) 4.0 - 10.5 K/uL  ? RBC 4.86 4.22 - 5.81 MIL/uL  ? Hemoglobin 14.2 13.0 - 17.0 g/dL  ? HCT 42.8 39.0 - 52.0 %  ? MCV 88.1 80.0 - 100.0 fL  ? MCH 29.2 26.0 - 34.0 pg  ? MCHC 33.2 30.0 - 36.0 g/dL  ? RDW 12.0 11.5 - 15.5 %  ? Platelets 193 150 - 400 K/uL  ? nRBC 0.0 0.0 - 0.2 %  ?Creatinine, serum  ?Result Value Ref Range  ? Creatinine, Ser 1.12 0.61 - 1.24 mg/dL  ? GFR, Estimated >60 >60 mL/min  ?Basic metabolic panel  ?Result Value Ref Range  ? Sodium 137  135 - 145 mmol/L  ? Potassium 4.2 3.5 - 5.1 mmol/L  ? Chloride 105 98 - 111 mmol/L  ? CO2 23 22 - 32 mmol/L  ? Glucose, Bld 174 (H) 70 - 99 mg/dL  ? BUN 17 6 - 20 mg/dL  ? Creatinine, Ser 1.05 0.61 - 1.24 mg/dL  ? Calc

## 2021-05-29 NOTE — Assessment & Plan Note (Signed)
Noted on past labs, check lipid panel today and BMP.  ASCVD 356%, diet focus at this time. ?

## 2021-05-29 NOTE — Assessment & Plan Note (Signed)
BMI 37.36.  Recommended eating smaller high protein, low fat meals more frequently and exercising 30 mins a day 5 times a week with a goal of 10-15lb weight loss in the next 3 months. Patient voiced their understanding and motivation to adhere to these recommendations. ? ?

## 2021-05-29 NOTE — Assessment & Plan Note (Signed)
From neck to wrist right arm after tweaking neck.  Suspect nerve impingement.  At this time will send in steroid pack and recommend gentle stretching daily at home + alternated heat and ice.  Return in 6 weeks, sooner if worsening. ?

## 2021-05-29 NOTE — Assessment & Plan Note (Addendum)
Ongoing, stable.  Continue supplement daily and recheck level next visit. 

## 2021-05-29 NOTE — Assessment & Plan Note (Signed)
Chronic, stable with BP at goal today. Continue Lisinopril 40 MG at this time and if elevations above goal in future consider addition of Amlodipine.  Recommend he continue to monitor BP at home and document + focus on DASH diet.  To notify provider if any dry cough presents.  LABS: BMP, CBC today.   ?

## 2021-05-29 NOTE — Assessment & Plan Note (Signed)
A1c remains stable today at 5.1%. ?

## 2021-05-30 ENCOUNTER — Other Ambulatory Visit: Payer: Self-pay | Admitting: Nurse Practitioner

## 2021-05-30 DIAGNOSIS — R7989 Other specified abnormal findings of blood chemistry: Secondary | ICD-10-CM

## 2021-05-30 NOTE — Progress Notes (Signed)
Contacted via [place] -- needs lab only visit in 4 weeks, thank you. ?The 10-year ASCVD risk score (Arnett DK, et al., 2019) is: 2.9% ?  Values used to calculate the score: ?    Age: 46 years ?    Sex: Male ?    Is Non-Hispanic African American: No ?    Diabetic: No ?    Tobacco smoker: No ?    Systolic Blood Pressure: 478 mmHg ?    Is BP treated: Yes ?    HDL Cholesterol: 45 mg/dL ?    Total Cholesterol: 195 mg/dL ? ? ?Good evening Darnelle Maffucci, your labs have returned: ?- LDL remains elevated, but total cholesterol has come down some.  Please continue focus on exercise and healthy diet. ?-- Kidney function, creatinine and eGFR, remains normal and CBC is stable. ?- Testosterone level, waiting on Free Testosterone, but total level is low this check.  Could be cause of current fatigue and mood changes.  I would like you to return in 4 weeks to recheck this in early morning -- if remains low will get you into urology.  Any questions? ?Keep being amazing!!  Thank you for allowing me to participate in your care.  I appreciate you. ?Kindest regards, ?[facility] ?

## 2021-05-31 LAB — CBC WITH DIFFERENTIAL/PLATELET
Basophils Absolute: 0.1 10*3/uL (ref 0.0–0.2)
Basos: 1 %
EOS (ABSOLUTE): 0.2 10*3/uL (ref 0.0–0.4)
Eos: 2 %
Hematocrit: 48.3 % (ref 37.5–51.0)
Hemoglobin: 15.8 g/dL (ref 13.0–17.7)
Immature Grans (Abs): 0 10*3/uL (ref 0.0–0.1)
Immature Granulocytes: 0 %
Lymphocytes Absolute: 2.1 10*3/uL (ref 0.7–3.1)
Lymphs: 24 %
MCH: 28.1 pg (ref 26.6–33.0)
MCHC: 32.7 g/dL (ref 31.5–35.7)
MCV: 86 fL (ref 79–97)
Monocytes Absolute: 0.8 10*3/uL (ref 0.1–0.9)
Monocytes: 8 %
Neutrophils Absolute: 5.8 10*3/uL (ref 1.4–7.0)
Neutrophils: 65 %
Platelets: 246 10*3/uL (ref 150–450)
RBC: 5.62 x10E6/uL (ref 4.14–5.80)
RDW: 12.6 % (ref 11.6–15.4)
WBC: 9 10*3/uL (ref 3.4–10.8)

## 2021-05-31 LAB — LIPID PANEL W/O CHOL/HDL RATIO
Cholesterol, Total: 195 mg/dL (ref 100–199)
HDL: 45 mg/dL (ref >39)
LDL Chol Calc (NIH): 134 mg/dL — ABNORMAL HIGH (ref 0–99)
Triglycerides: 85 mg/dL (ref 0–149)
VLDL Cholesterol Cal: 16 mg/dL (ref 5–40)

## 2021-05-31 LAB — BASIC METABOLIC PANEL
BUN/Creatinine Ratio: 17 (ref 9–20)
BUN: 14 mg/dL (ref 6–24)
CO2: 21 mmol/L (ref 20–29)
Calcium: 9.1 mg/dL (ref 8.7–10.2)
Chloride: 102 mmol/L (ref 96–106)
Creatinine, Ser: 0.83 mg/dL (ref 0.76–1.27)
Glucose: 91 mg/dL (ref 70–99)
Potassium: 4.3 mmol/L (ref 3.5–5.2)
Sodium: 142 mmol/L (ref 134–144)
eGFR: 110 mL/min/{1.73_m2} (ref >59)

## 2021-05-31 LAB — TESTOSTERONE, FREE, TOTAL, SHBG
Sex Hormone Binding: 29.4 nmol/L (ref 16.5–55.9)
Testosterone, Free: 11.2 pg/mL (ref 6.8–21.5)
Testosterone: 258 ng/dL — ABNORMAL LOW (ref 264–916)

## 2021-06-07 ENCOUNTER — Emergency Department: Payer: Managed Care, Other (non HMO)

## 2021-06-07 ENCOUNTER — Emergency Department
Admission: EM | Admit: 2021-06-07 | Discharge: 2021-06-07 | Disposition: A | Discharge status: Other Acute Inpatient Hospital | Payer: Managed Care, Other (non HMO) | Attending: Emergency Medicine | Admitting: Emergency Medicine

## 2021-06-07 ENCOUNTER — Other Ambulatory Visit: Payer: Self-pay

## 2021-06-07 ENCOUNTER — Encounter (HOSPITAL_COMMUNITY): Payer: Self-pay | Admitting: Emergency Medicine

## 2021-06-07 ENCOUNTER — Emergency Department (HOSPITAL_COMMUNITY)
Admission: EM | Admit: 2021-06-07 | Discharge: 2021-06-07 | Disposition: A | Discharge status: Home / Self Care | Payer: Managed Care, Other (non HMO) | Attending: Emergency Medicine | Admitting: Emergency Medicine

## 2021-06-07 ENCOUNTER — Emergency Department (HOSPITAL_COMMUNITY): Payer: Managed Care, Other (non HMO)

## 2021-06-07 DIAGNOSIS — I10 Essential (primary) hypertension: Secondary | ICD-10-CM | POA: Diagnosis not present

## 2021-06-07 DIAGNOSIS — W2107XA Struck by softball, initial encounter: Secondary | ICD-10-CM | POA: Diagnosis not present

## 2021-06-07 DIAGNOSIS — S022XXA Fracture of nasal bones, initial encounter for closed fracture: Secondary | ICD-10-CM | POA: Insufficient documentation

## 2021-06-07 DIAGNOSIS — Z79899 Other long term (current) drug therapy: Secondary | ICD-10-CM | POA: Insufficient documentation

## 2021-06-07 DIAGNOSIS — S0993XA Unspecified injury of face, initial encounter: Secondary | ICD-10-CM

## 2021-06-07 DIAGNOSIS — S0292XA Unspecified fracture of facial bones, initial encounter for closed fracture: Secondary | ICD-10-CM

## 2021-06-07 DIAGNOSIS — S0992XA Unspecified injury of nose, initial encounter: Secondary | ICD-10-CM | POA: Diagnosis present

## 2021-06-07 DIAGNOSIS — S0512XA Contusion of eyeball and orbital tissues, left eye, initial encounter: Secondary | ICD-10-CM | POA: Insufficient documentation

## 2021-06-07 DIAGNOSIS — S0990XA Unspecified injury of head, initial encounter: Secondary | ICD-10-CM | POA: Diagnosis present

## 2021-06-07 DIAGNOSIS — S0219XA Other fracture of base of skull, initial encounter for closed fracture: Secondary | ICD-10-CM

## 2021-06-07 MED ORDER — OXYCODONE-ACETAMINOPHEN 5-325 MG PO TABS
1.0000 | ORAL_TABLET | ORAL | Status: DC | PRN
  Administered 2021-06-07: 1 via ORAL
  Filled 2021-06-07: qty 1

## 2021-06-07 MED ORDER — MORPHINE SULFATE (PF) 4 MG/ML IV SOLN
4.0000 mg | Freq: Once | INTRAVENOUS | Status: AC
  Administered 2021-06-07: 4 mg via INTRAVENOUS
  Filled 2021-06-07: qty 1

## 2021-06-07 MED ORDER — IOHEXOL 350 MG/ML SOLN
83.0000 mL | Freq: Once | INTRAVENOUS | Status: AC | PRN
  Administered 2021-06-07: 83 mL via INTRAVENOUS

## 2021-06-07 MED ORDER — MORPHINE SULFATE 15 MG PO TABS: 7.5000 mg | ORAL_TABLET | ORAL | 0 refills | Status: DC | PRN

## 2021-06-07 MED ORDER — HYDROCODONE-ACETAMINOPHEN 5-325 MG PO TABS
1.0000 | ORAL_TABLET | Freq: Once | ORAL | Status: AC
  Administered 2021-06-07: 1 via ORAL
  Filled 2021-06-07: qty 1

## 2021-06-07 MED ORDER — ONDANSETRON HCL 4 MG/2ML IJ SOLN
4.0000 mg | Freq: Once | INTRAMUSCULAR | Status: AC
  Administered 2021-06-07: 4 mg via INTRAVENOUS
  Filled 2021-06-07: qty 2

## 2021-06-07 MED ORDER — HYDROMORPHONE HCL 1 MG/ML IJ SOLN
1.0000 mg | Freq: Once | INTRAMUSCULAR | Status: AC
  Administered 2021-06-07: 1 mg via INTRAVENOUS
  Filled 2021-06-07: qty 1

## 2021-06-07 NOTE — ED Notes (Signed)
See triage note. Pt continues to hold paper towel to nose as oozing blood. Pt A&Ox4; ambulatory to room; steady.  ?

## 2021-06-07 NOTE — ED Notes (Signed)
E-signature pad unavailable at time of pt discharge. This RN discussed discharge materials with pt and answered all pt questions. Pt stated understanding of discharge material. ? ?

## 2021-06-07 NOTE — ED Provider Notes (Signed)
? ?[facility] ?Provider Note ? ? Event Date/Time  ? First MD Initiated Contact with Patient 06/07/21 1811   ?  (approximate) ?History  ?Facial Injury ? ?HPI ?Joe Gutierrez is a 46 y.o. male with stated past medical history of hypertension who presents after a softball hit him directly between the eyes and in the nose.  Patient now complains of 10/10 forehead and nasal pain.  Patient states that he had some bleeding from the nose after this occurred but it has subsided at this point after a clamp was placed.  Patient denies any loss of consciousness.  Patient denies any subsequent nausea/vomiting.  Patient states that his nose feels "stuffed up" but he is able to breathe through it. ?Physical Exam  ?Triage Vital Signs: ?ED Triage Vitals  ?Enc Vitals Group  ?   BP 06/07/21 1724 (!) 170/120  ?   Pulse Rate 06/07/21 1724 87  ?   Resp 06/07/21 1724 18  ?   Temp 06/07/21 1724 98.6 ?F (37 ?C)  ?   Temp Source 06/07/21 1724 Oral  ?   SpO2 06/07/21 1724 91 %  ?   Weight --   ?   Height 06/07/21 1732 '5\' 10"'$  (1.778 m)  ?   Head Circumference --   ?   Peak Flow --   ?   Pain Score 06/07/21 1732 10  ?   Pain Loc --   ?   Pain Edu? --   ?   Excl. in [place]? --   ? ?Most recent vital signs: ?Vitals:  ? 06/07/21 1724  ?BP: (!) 170/120  ?Pulse: 87  ?Resp: 18  ?Temp: 98.6 ?F (37 ?C)  ?SpO2: 91%  ? ?General: Awake, oriented x4. ?CV:  Good peripheral perfusion.  ?Resp:  Normal effort.  ?Abd:  No distention.  ?Other:  Middle-aged Caucasian male laying in bed in mild distress secondary to pain.  Erythema, edema, and ecchymosis to bilateral frontal sinuses, nasal bridge significantly erythematous and edematous. ?ED Results / Procedures / Treatments  ? ?RADIOLOGY ?ED MD interpretation: CT of the maxillofacial structures without contrast shows comminuted depressed fractures of the anterior table of bilateral frontal sinuses with extension posteriorly to involve the ethmoid air cells and possible anterior cribriform  plate involvement.  There is also fractures involving bilateral nasal bones as right nasal lacrimal duct, and anterior lamina papyracea bilaterally ?-Agree with radiology assessment ?Official radiology report(s): ?CT Maxillofacial Wo Contrast ? ?Result Date: 06/07/2021 ?CLINICAL DATA:  Neck trauma (Age >= 65y) injury EXAM: CT MAXILLOFACIAL WITHOUT CONTRAST TECHNIQUE: Multidetector CT imaging of the maxillofacial structures was performed. Multiplanar CT image reconstructions were also generated. RADIATION DOSE REDUCTION: This exam was performed according to the departmental dose-optimization program which includes automated exposure control, adjustment of the mA and/or kV according to patient size and/or use of iterative reconstruction technique. COMPARISON:  Maxillofacial CT 10/08/2013. FINDINGS: Osseous: Comminuted and mildly depressed fractures of the outer table of the frontal sinuses with and fractures possibly extending to involve the cribriform plate anteriorly. More inferiorly there is comminuted and depressed fractures of bilateral nasal bones with nondisplaced fracture extending through the anterior and posterior walls of the right maxillary sinus as well as the right nasal lacrimal duct. No evidence of tear white plate fractures. Bilateral TMJ joints are located. Orbits: Remote fracture of the anterior left orbital floor. Acute fractures of the anterior aspect of the lamina papyracea bilaterally. No sizable retro bulbar hematoma. No proptosis. The globes, extraocular muscles,  and lacrimal glands are unremarkable. Sinuses: Right maxillary sinus, frontal sinus, and scattered ethmoid air cell hemosinus. Some blood within the anterior nasal cavity. Soft tissues: Overlying nasal imported contusions. Limited intracranial: No significant or unexpected finding on limited assessment. IMPRESSION: 1. Comminuted depressed fractures of the outer table of the frontal sinus bilaterally with extension posteriorly to  involve the ethmoid air cells and possible involvement of the anterior cribriform plate. 2. Inferiorly, fractures involve bilateral nasal bones, right nasolacrimal duct, anterior lamina lamina papyracea bilaterally, and the anterior and posterior walls of right maxillary sinus. 3. Overlying nasal and forehead contusions. 4. Right maxillary sinus, frontal sinus, and scattered ethmoid air cell hemosinus. 5. Given the extent of facial trauma, recommend dedicated CT head to better assess to exclude acute intracranial abnormality. Electronically Signed   By: Margaretha Sheffield M.D.   On: 06/07/2021 18:09   ?PROCEDURES: ?Critical Care performed: Yes, see critical care procedure note(s) ?.1-3 Lead EKG Interpretation ?Performed by: Naaman Plummer, MD ?Authorized by: Naaman Plummer, MD  ? ?  Interpretation: normal   ?  ECG rate:  86 ?  ECG rate assessment: normal   ?  Rhythm: sinus rhythm   ?  Ectopy: none   ?  Conduction: normal   ?CRITICAL CARE ?Performed by: Naaman Plummer ? ?Total critical care time: 31 minutes ? ?Critical care time was exclusive of separately billable procedures and treating other patients. ? ?Critical care was necessary to treat or prevent imminent or life-threatening deterioration. ? ?Critical care was time spent personally by me on the following activities: development of treatment plan with patient and/or surrogate as well as nursing, discussions with consultants, evaluation of patient's response to treatment, examination of patient, obtaining history from patient or surrogate, ordering and performing treatments and interventions, ordering and review of laboratory studies, ordering and review of radiographic studies, pulse oximetry and re-evaluation of patient's condition. ? ?MEDICATIONS ORDERED IN ED: ?Medications  ?oxyCODONE-acetaminophen (PERCOCET/ROXICET) 5-325 MG per tablet 1 tablet (1 tablet Oral Given 06/07/21 1739)  ? ?IMPRESSION / MDM / ASSESSMENT AND PLAN / ED COURSE  ?I reviewed the  triage vital signs and the nursing notes. ?             ?               ?Differential diagnosis includes, but is not limited to, frontal sinus fracture, nasal bone fracture, intracranial hemorrhage, skull fracture ?The patient is on the cardiac monitor to evaluate for evidence of arrhythmia and/or significant heart rate changes. ?Patient a 46 year old male with the above-stated past medical history presents after significant trauma to the frontal face from a softball.  CT evaluation shows significant injuries including bilateral frontal sinus fractures, bilateral nasal bone fractures, and nasal lacrimal duct injury.  Patient shows no signs of of septal hematoma, extraocular muscle entrapment, or significant concussive symptoms.  I spoke to the on-call trauma attending at [facility], Dr. Bobbye Morton, who agrees to accept patient in transfer.  I spoke to patient who understands and agrees with plan. ?Tx: Pain/nausea as needed ?Dispo: Transfer to [facility] ? ?  ?FINAL CLINICAL IMPRESSION(S) / ED DIAGNOSES  ? ?Final diagnoses:  ?Closed fracture of frontal sinus, initial encounter ([place])  ?Closed fracture of nasal bone, initial encounter  ?Facial injury, initial encounter  ? ?Rx / DC Orders  ? ?ED Discharge Orders   ? ? None  ? ?  ? ?Note:  This document was prepared using Systems analyst and  may include unintentional dictation errors. ?  ?Naaman Plummer, MD ?06/07/21 1946 ? ?

## 2021-06-07 NOTE — Consult Note (Signed)
CT Scan reviewed and pt discussed with ER provider.  Planning for closed reduction of nasal fractures Tuesday April 25 in the morning at [place].  Call with any questions or concerns. ?

## 2021-06-07 NOTE — ED Notes (Signed)
Pt accepted to [facility] ED to ED per Theone Murdoch, coordinator. Call report to [phone] ?

## 2021-06-07 NOTE — ED Triage Notes (Addendum)
Patient to ER via POV, reports being hit in the face with a soft ball within close range PTA. 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 Nose clamp placed. Nose bleeding. Obvious deformity. History of facial fractures on left cheek x2.  ? ?Denies blood thinner usage or LOC.  ?

## 2021-06-07 NOTE — Discharge Instructions (Addendum)
Please follow-up with plastics and ophtho.   ? ?Take 4 over the counter ibuprofen tablets 3 times a day or 2 over-the-counter naproxen tablets twice a day for pain. ?Also take tylenol '1000mg'$ (2 extra strength) four times a day.  ? ?Elevated your head at least 15 degrees.  You can use afirin as needed for the next three days for congestion.  No nose blowing.  ? ?Then take the pain medicine if you feel like you need it. Narcotics do not help with the pain, they only make you care about it less.  You can become addicted to this, people may break into your house to steal it.  It will constipate you.  If you drive under the influence of this medicine you can get a DUI.   ? ?

## 2021-06-07 NOTE — ED Notes (Signed)
CARELINK  CALLED  PER  DR  Cheri Fowler ?

## 2021-06-07 NOTE — ED Provider Notes (Signed)
?[place] ?Provider Note ? ? ?CSN: [id number] ?Arrival date & time: 06/07/21  2034 ? ?  ? ?History ? ?Chief Complaint  ?Patient presents with  ? Facial Injury  ? ? ?Joe Gutierrez is a 46 y.o. male. ? ?46 yo M with a cc of a facial injury.  Patient was hit in the face with a softball.  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  He denies loss of consciousness denies confusion denies vomiting.  He went to a ED in our system and was found to have multiple facial fractures and then was sent here for evaluation. ? ? ?Facial Injury ? ?  ? ?Home Medications ?Prior to Admission medications   ?Medication Sig Start Date End Date Taking? Authorizing Provider  ?morphine (MSIR) 15 MG tablet Take 0.5 tablets (7.5 mg total) by mouth every 4 (four) hours as needed for severe pain. 06/07/21  Yes Deno Etienne, DO  ?acetaminophen (TYLENOL) 500 MG tablet Take 500 mg by mouth every 6 (six) hours as needed.    [provider]  ?buPROPion (WELLBUTRIN XL) 150 MG 24 hr tablet Take 1 tablet (150 mg total) by mouth daily. 05/29/21   Cannady, Henrine Screws T, NP  ?busPIRone (BUSPAR) 5 MG tablet Take 1 tablet (5 mg total) by mouth 2 (two) times daily. 08/01/20   Cannady, Henrine Screws T, NP  ?lisinopril (ZESTRIL) 40 MG tablet TAKE 1 TABLET(40 MG) BY MOUTH DAILY 10/01/20   Cannady, Henrine Screws T, NP  ?methylPREDNISolone (MEDROL DOSEPAK) 4 MG TBPK tablet Take as instructed on package. 05/29/21   Cannady, Henrine Screws T, NP  ?pregabalin (LYRICA) 50 MG capsule Take 1 capsule (50 mg total) by mouth 3 (three) times daily. 01/26/21   Ronny Bacon, MD  ?sertraline (ZOLOFT) 100 MG tablet Take 2 tablets (200 mg total) by mouth daily. 06/18/20   Marnee Guarneri T, NP  ?sildenafil (VIAGRA) 50 MG tablet Take 1 tablet (50 mg total) by mouth daily as needed for erectile dysfunction. 11/26/20   Venita Lick, NP  ?Vitamin D, Cholecalciferol,  25 MCG (1000 UT) TABS Take 1,000 Units by mouth daily.    [provider]  ?   ? ?Allergies    ?Codeine and Sulfa antibiotics   ? ?Review of Systems   ?Review of Systems ? ?Physical Exam ?Updated Vital Signs ?BP (!) 158/108   Pulse 79   Temp 98.4 ?F (36.9 ?C)   Resp 18   Ht '5\' 10"'$  (1.778 m)   Wt 115.7 kg   SpO2 95%   BMI 36.59 kg/m?  ?Physical Exam ?Vitals and nursing note reviewed.  ?Constitutional:   ?   Appearance: He is well-developed.  ?HENT:  ?   Head: Normocephalic.  ?   Comments: Periorbital hematoma.  Nasal deformity without nasal septal hematoma.  No obvious intraoral trauma. ?Eyes:  ?   Pupils: Pupils are equal, round, and reactive to light.  ?Neck:  ?   Vascular: No JVD.  ?Cardiovascular:  ?   Rate and Rhythm: Normal rate and regular rhythm.  ?   Heart sounds: No murmur heard. ?  No friction rub. No gallop.  ?Pulmonary:  ?   Effort: No respiratory distress.  ?   Breath sounds: No wheezing.  ?Abdominal:  ?   General: There is no distension.  ?   Tenderness: There is no abdominal tenderness. There is no guarding  or rebound.  ?Musculoskeletal:     ?   General: Normal range of motion.  ?   Cervical back: Normal range of motion and neck supple.  ?Skin: ?   Coloration: Skin is not pale.  ?   Findings: No rash.  ?Neurological:  ?   Mental Status: He is alert and oriented to person, place, and time.  ?Psychiatric:     ?   Behavior: Behavior normal.  ? ? ?ED Results / Procedures / Treatments   ?Labs ?(all labs ordered are listed, but only abnormal results are displayed) ?Labs Reviewed - No data to display ? ?EKG ?None ? ?Radiology ?CT Angio Head W or Wo Contrast ? ?Result Date: 06/07/2021 ?CLINICAL DATA:  Facial trauma EXAM: CT HEAD WITHOUT CONTRAST CT ANGIOGRAPHY OF THE HEAD AND NECK TECHNIQUE: Contiguous axial images were obtained from the base of the skull through the vertex without intravenous contrast. Multidetector CT imaging of the head and neck was performed using the standard protocol  during bolus administration of intravenous contrast. Multiplanar CT image reconstructions and MIPs were obtained to evaluate the vascular anatomy. Carotid stenosis measurements (when applicable) are obtained utilizing NASCET criteria, using the distal internal carotid diameter as the denominator. RADIATION DOSE REDUCTION: This exam was performed according to the departmental dose-optimization program which includes automated exposure control, adjustment of the mA and/or kV according to patient size and/or use of iterative reconstruction technique. CONTRAST:  36m OMNIPAQUE IOHEXOL 350 MG/ML SOLN COMPARISON:  None. FINDINGS: CT HEAD FINDINGS Brain: There is no mass, hemorrhage or extra-axial collection. The size and configuration of the ventricles and extra-axial CSF spaces are normal. There is no acute or chronic infarction. The brain parenchyma is normal. Skull: Facial fractures are described on the earlier maxillofacial CT. In brief, there are comminuted fractures through the anterior table of the frontal sinus and the nasoorbitoethmoidal complex. CTA NECK FINDINGS SKELETON: There is no bony spinal canal stenosis. No lytic or blastic lesion. OTHER NECK: Normal pharynx, larynx and major salivary glands. No cervical lymphadenopathy. Unremarkable thyroid gland. UPPER CHEST: No pneumothorax or pleural effusion. No nodules or masses. AORTIC ARCH: There is no calcific atherosclerosis of the aortic arch. There is no aneurysm, dissection or hemodynamically significant stenosis of the visualized portion of the aorta. Conventional 3 vessel aortic branching pattern. The visualized proximal subclavian arteries are widely patent. RIGHT CAROTID SYSTEM: Normal without aneurysm, dissection or stenosis. LEFT CAROTID SYSTEM: Normal without aneurysm, dissection or stenosis. 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 CTA HEAD FINDINGS  POSTERIOR CIRCULATION: --Vertebral arteries: Markedly diminutive vertebral arteries --Inferior cerebellar arteries: Normal. --Basilar artery: The basilar artery is supplied via a persistent primitive hypo glossal artery that travels from the left ICA through the left hypoglossal canal. The basilar artery is widely patent. --Superior cerebellar arteries: Normal. --Posterior cerebral arteries (PCA): Normal. ANTERIOR CIRCULATION: --Intracranial internal carotid arteries: Normal. --Anterior cerebral arteries (ACA): Normal. Both A1 segments are present. Patent anterior communicating artery (a-comm). --Middle cerebral arteries (MCA): Normal. VENOUS SINUSES: As permitted by contrast timing, patent. ANATOMIC VARIANTS: Left persistent primitive hypoglossal artery Review of the MIP images confirms the above findings. IMPRESSION: 1. No acute intracranial abnormality. 2. No intracranial arterial occlusion or high-grade stenosis. 3. No carotid or vertebral dissection 4. Left persistent primitive hypoglossal artery, a rare normal variant. 5. Facial fractures described on the earlier maxillofacial CT. Electronically Signed   By: KUlyses JarredM.D.   On: 06/07/2021 23:06  ? ?  CT Head Wo Contrast ? ?Result Date: 06/07/2021 ?CLINICAL DATA:  Facial trauma EXAM: CT HEAD WITHOUT CONTRAST CT ANGIOGRAPHY OF THE HEAD AND NECK TECHNIQUE: Contiguous axial images were obtained from the base of the skull through the vertex without intravenous contrast. Multidetector CT imaging of the head and neck was performed using the standard protocol during bolus administration of intravenous contrast. Multiplanar CT image reconstructions and MIPs were obtained to evaluate the vascular anatomy. Carotid stenosis measurements (when applicable) are obtained utilizing NASCET criteria, using the distal internal carotid diameter as the denominator. RADIATION DOSE REDUCTION: This exam was performed according to the departmental dose-optimization program which  includes automated exposure control, adjustment of the mA and/or kV according to patient size and/or use of iterative reconstruction technique. CONTRAST:  29m OMNIPAQUE IOHEXOL 350 MG/ML SOLN COMPARISON:  None. FINDINGS:

## 2021-06-07 NOTE — ED Triage Notes (Signed)
Patient arrives via [facility] from [facility] after being struck in the face with a softball. 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 No LOC. Multiple facial fractures on OSH CT maxface. Obvious deformities noted. Received '1MG'$  hydromorphone and a percocet PTA, pain 3/10.  ? ?20G R AC ?

## 2021-06-08 NOTE — ED Notes (Signed)
Trauma Event Note ? ? ?Attended to nonactivated trauma, transfer from [facility] for further eval of facial fractures. 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 Deformity to midface, bruising to left eye. Multiple facial fractures from OSH CT.  Ancipitate CT angio, CT head and ENT/trauma consults. Alert x4, CGS 15, VSS  ? ? ?[place]  ?Trauma Response RN ? ?Please call TRN at [phone] for further assistance. ? ? ?  ?

## 2021-06-10 ENCOUNTER — Other Ambulatory Visit: Payer: Self-pay

## 2021-06-10 ENCOUNTER — Encounter (HOSPITAL_COMMUNITY): Payer: Self-pay | Admitting: Plastic Surgery

## 2021-06-10 NOTE — Progress Notes (Addendum)
Mr. Joe Gutierrez denies chest pain or shortness of breath. Patient reports that he has had shortness in the past,related to stress, PCP added a new medication and he has not experienced any further chest pain.  ?Joe Gutierrez Patient denies having any s/s of Covid in his household.  Patient denies any known exposure to Covid.  ? ?I instructed  Joe Gutierrez  to shower with antibacteria soap. No nail polish, artificial or acrylic nails. Wear clean clothes, brush your teeth. ?Glasses, contact lens,dentures or partials may not be worn in the OR. If you need to wear them, please bring a case for glasses, do not wear contacts or bring a case, the hospital does not have contact cases, dentures or partials will have to be removed , make sure they are clean, we will provide a denture cup to put them in. You will need some one to drive you home and a responsible person over the age of 56 to stay with you for the first 24 hours after surgery.  ? ?PCP Joe Must, NP input regarding [name], copied from a staff note PCP sent to me. ?"Yes, his recent visit his CP was from increased anxiety came along with panic.  However we have adjusted medications.  ASCVD lower risk at 3.5% we are working on diet and healthy lifestyle changes at this time." ?

## 2021-06-11 ENCOUNTER — Other Ambulatory Visit: Payer: Self-pay

## 2021-06-11 ENCOUNTER — Encounter (HOSPITAL_COMMUNITY): Payer: Self-pay | Admitting: Plastic Surgery

## 2021-06-11 ENCOUNTER — Ambulatory Visit (HOSPITAL_COMMUNITY): Payer: Managed Care, Other (non HMO) | Admitting: Physician Assistant

## 2021-06-11 ENCOUNTER — Ambulatory Visit (HOSPITAL_BASED_OUTPATIENT_CLINIC_OR_DEPARTMENT_OTHER): Payer: Managed Care, Other (non HMO) | Admitting: Physician Assistant

## 2021-06-11 ENCOUNTER — Encounter (HOSPITAL_COMMUNITY)
Admission: RE | Disposition: A | Discharge status: Home / Self Care | Payer: Self-pay | Source: Home / Self Care | Attending: Plastic Surgery

## 2021-06-11 ENCOUNTER — Ambulatory Visit (HOSPITAL_COMMUNITY)
Admission: RE | Admit: 2021-06-11 | Discharge: 2021-06-11 | Disposition: A | Discharge status: Home / Self Care | Payer: Managed Care, Other (non HMO) | Attending: Plastic Surgery | Admitting: Plastic Surgery

## 2021-06-11 DIAGNOSIS — I10 Essential (primary) hypertension: Secondary | ICD-10-CM | POA: Diagnosis not present

## 2021-06-11 DIAGNOSIS — S0219XA Other fracture of base of skull, initial encounter for closed fracture: Secondary | ICD-10-CM | POA: Insufficient documentation

## 2021-06-11 DIAGNOSIS — S022XXA Fracture of nasal bones, initial encounter for closed fracture: Secondary | ICD-10-CM | POA: Insufficient documentation

## 2021-06-11 DIAGNOSIS — W2107XA Struck by softball, initial encounter: Secondary | ICD-10-CM | POA: Diagnosis not present

## 2021-06-11 HISTORY — PX: CLOSED REDUCTION NASAL FRACTURE: SHX5365

## 2021-06-11 SURGERY — CLOSED REDUCTION, FRACTURE, NASAL BONE
Anesthesia: General | Site: Nose

## 2021-06-11 MED ORDER — LIDOCAINE-EPINEPHRINE 1 %-1:100000 IJ SOLN
INTRAMUSCULAR | Status: AC
  Filled 2021-06-11: qty 1

## 2021-06-11 MED ORDER — OXYCODONE HCL 5 MG PO TABS
5.0000 mg | ORAL_TABLET | Freq: Three times a day (TID) | ORAL | 0 refills | Status: DC | PRN
Start: 2021-06-11 — End: 2021-07-10

## 2021-06-11 MED ORDER — LIDOCAINE-EPINEPHRINE 1 %-1:100000 IJ SOLN
INTRAMUSCULAR | Status: DC | PRN
  Administered 2021-06-11: 4 mL

## 2021-06-11 MED ORDER — CEFAZOLIN SODIUM-DEXTROSE 2-4 GM/100ML-% IV SOLN
2.0000 g | INTRAVENOUS | Status: AC
  Administered 2021-06-11: 2 g via INTRAVENOUS
  Filled 2021-06-11: qty 100

## 2021-06-11 MED ORDER — 0.9 % SODIUM CHLORIDE (POUR BTL) OPTIME
TOPICAL | Status: DC | PRN
  Administered 2021-06-11: 1000 mL

## 2021-06-11 MED ORDER — OXYMETAZOLINE HCL 0.05 % NA SOLN
NASAL | Status: DC | PRN
  Administered 2021-06-11: 1

## 2021-06-11 MED ORDER — MIDAZOLAM HCL 2 MG/2ML IJ SOLN
INTRAMUSCULAR | Status: DC | PRN
  Administered 2021-06-11: 2 mg via INTRAVENOUS

## 2021-06-11 MED ORDER — LIDOCAINE 2% (20 MG/ML) 5 ML SYRINGE
INTRAMUSCULAR | Status: DC | PRN
  Administered 2021-06-11: 80 mg via INTRAVENOUS

## 2021-06-11 MED ORDER — FENTANYL CITRATE (PF) 100 MCG/2ML IJ SOLN
INTRAMUSCULAR | Status: AC
  Filled 2021-06-11: qty 2

## 2021-06-11 MED ORDER — DEXAMETHASONE SODIUM PHOSPHATE 10 MG/ML IJ SOLN
INTRAMUSCULAR | Status: DC | PRN
  Administered 2021-06-11: 10 mg via INTRAVENOUS

## 2021-06-11 MED ORDER — HYDRALAZINE HCL 20 MG/ML IJ SOLN
INTRAMUSCULAR | Status: AC
  Filled 2021-06-11: qty 1

## 2021-06-11 MED ORDER — AMISULPRIDE (ANTIEMETIC) 5 MG/2ML IV SOLN: 10.0000 mg | Freq: Once | INTRAVENOUS | Status: DC | PRN

## 2021-06-11 MED ORDER — OXYMETAZOLINE HCL 0.05 % NA SOLN
NASAL | Status: AC
  Filled 2021-06-11: qty 30

## 2021-06-11 MED ORDER — FENTANYL CITRATE (PF) 250 MCG/5ML IJ SOLN
INTRAMUSCULAR | Status: AC
  Filled 2021-06-11: qty 5

## 2021-06-11 MED ORDER — HYDRALAZINE HCL 20 MG/ML IJ SOLN
10.0000 mg | Freq: Once | INTRAMUSCULAR | Status: AC
  Administered 2021-06-11: 10 mg via INTRAVENOUS

## 2021-06-11 MED ORDER — BACITRACIN ZINC 500 UNIT/GM EX OINT
TOPICAL_OINTMENT | CUTANEOUS | Status: DC | PRN
  Administered 2021-06-11: 1 via TOPICAL

## 2021-06-11 MED ORDER — PROPOFOL 10 MG/ML IV BOLUS
INTRAVENOUS | Status: DC | PRN
Start: 2021-06-11 — End: 2021-06-11
  Administered 2021-06-11: 200 mg via INTRAVENOUS

## 2021-06-11 MED ORDER — ONDANSETRON 4 MG PO TBDP: 4.0000 mg | ORAL_TABLET | Freq: Three times a day (TID) | ORAL | 0 refills | Status: DC | PRN

## 2021-06-11 MED ORDER — BACITRACIN ZINC 500 UNIT/GM EX OINT
TOPICAL_OINTMENT | CUTANEOUS | Status: AC
  Filled 2021-06-11: qty 28.35

## 2021-06-11 MED ORDER — LACTATED RINGERS IV SOLN: INTRAVENOUS | Status: DC

## 2021-06-11 MED ORDER — CHLORHEXIDINE GLUCONATE 0.12 % MT SOLN
15.0000 mL | Freq: Once | OROMUCOSAL | Status: AC
  Administered 2021-06-11: 15 mL via OROMUCOSAL
  Filled 2021-06-11: qty 15

## 2021-06-11 MED ORDER — MIDAZOLAM HCL 2 MG/2ML IJ SOLN
INTRAMUSCULAR | Status: AC
  Filled 2021-06-11: qty 2

## 2021-06-11 MED ORDER — BUPIVACAINE-EPINEPHRINE (PF) 0.25% -1:200000 IJ SOLN
INTRAMUSCULAR | Status: AC
  Filled 2021-06-11: qty 30

## 2021-06-11 MED ORDER — ORAL CARE MOUTH RINSE: 15.0000 mL | Freq: Once | OROMUCOSAL | Status: AC

## 2021-06-11 MED ORDER — FENTANYL CITRATE (PF) 100 MCG/2ML IJ SOLN
25.0000 ug | INTRAMUSCULAR | Status: DC | PRN
  Administered 2021-06-11: 50 ug via INTRAVENOUS

## 2021-06-11 MED ORDER — FENTANYL CITRATE (PF) 250 MCG/5ML IJ SOLN
INTRAMUSCULAR | Status: DC | PRN
  Administered 2021-06-11: 25 ug via INTRAVENOUS
  Administered 2021-06-11: 50 ug via INTRAVENOUS

## 2021-06-11 MED ORDER — BUPIVACAINE-EPINEPHRINE 0.25% -1:200000 IJ SOLN
INTRAMUSCULAR | Status: DC | PRN
  Administered 2021-06-11: 30 mL

## 2021-06-11 MED ORDER — ONDANSETRON HCL 4 MG/2ML IJ SOLN
INTRAMUSCULAR | Status: DC | PRN
  Administered 2021-06-11: 4 mg via INTRAVENOUS

## 2021-06-11 MED ORDER — ACETAMINOPHEN 500 MG PO TABS
1000.0000 mg | ORAL_TABLET | Freq: Once | ORAL | Status: AC
  Administered 2021-06-11: 1000 mg via ORAL
  Filled 2021-06-11: qty 2

## 2021-06-11 SURGICAL SUPPLY — 25 items
BAG COUNTER SPONGE SURGICOUNT (BAG) ×2 IMPLANT
BLADE SURG 15 STRL LF DISP TIS (BLADE) IMPLANT
BLADE SURG 15 STRL SS (BLADE)
CANISTER SUCT 3000ML PPV (MISCELLANEOUS) ×2 IMPLANT
DRAPE HALF SHEET 40X57 (DRAPES) IMPLANT
GLOVE SURG ENC TEXT LTX SZ7.5 (GLOVE) ×2 IMPLANT
GOWN STRL REUS W/ TWL LRG LVL3 (GOWN DISPOSABLE) ×2 IMPLANT
GOWN STRL REUS W/TWL LRG LVL3 (GOWN DISPOSABLE) ×4
KIT BASIN OR (CUSTOM PROCEDURE TRAY) ×2 IMPLANT
KIT SPLINT NASAL DENVER LRG BE (GAUZE/BANDAGES/DRESSINGS) IMPLANT
KIT SPLINT NASAL DENVER MIN BE (GAUZE/BANDAGES/DRESSINGS) IMPLANT
KIT SPLINT NASAL DENVER PET BE (GAUZE/BANDAGES/DRESSINGS) ×1 IMPLANT
KIT SPLINT NASAL DENVER SM BEI (GAUZE/BANDAGES/DRESSINGS) IMPLANT
KIT TURNOVER KIT B (KITS) ×2 IMPLANT
NDL HYPO 25GX1X1/2 BEV (NEEDLE) ×1 IMPLANT
NEEDLE HYPO 25GX1X1/2 BEV (NEEDLE) ×2 IMPLANT
NS IRRIG 1000ML POUR BTL (IV SOLUTION) ×2 IMPLANT
PAD ARMBOARD 7.5X6 YLW CONV (MISCELLANEOUS) ×4 IMPLANT
PATTIES SURGICAL .5 X3 (DISPOSABLE) ×1 IMPLANT
POSITIONER HEAD DONUT 9IN (MISCELLANEOUS) IMPLANT
SPLINT NASAL AIRWAY SILICONE (MISCELLANEOUS) ×1 IMPLANT
SYR CONTROL 10ML LL (SYRINGE) ×2 IMPLANT
TOWEL GREEN STERILE FF (TOWEL DISPOSABLE) ×2 IMPLANT
TRAY ENT MC OR (CUSTOM PROCEDURE TRAY) ×2 IMPLANT
WATER STERILE IRR 1000ML POUR (IV SOLUTION) ×1 IMPLANT

## 2021-06-11 NOTE — Discharge Instructions (Addendum)
Activity: As tolerated, but avoid strenuous activity until follow up visit. ? ?Diet: Regular ? ?Wound Care: You have internal splints and an external splint.  Your nose was realigned during today's surgery.  If you have mild bleeding from the nares, that is OK.  Call if you have any significant bleeding unrelieved by holding pressure.  The internal splints have been secured with a stitch and will likely be removed at your first follow-up appointment.  The external splint may be left on for longer.  Do not blow your nose.  Please keep the area dry.   ? ?Special Instructions: ? ?Call our office if any unusual problems occur such as pain, excessive ?bleeding, unrelieved nausea/vomiting, fever &/or chills. ? ?Follow-up appointment: Scheduled for next week. ? ?Medications: Please take Tylenol or ibuprofen for pain control, unless specifically told to avoid these medications in the past.  I have prescribed you oxycodone which you can take as needed for breakthrough pain.  If you have any residual morphine that had been prescribed from the ER, do NOT take them together.  I have also prescribed you Zofran which you can take if needed for nausea symptoms.  ?

## 2021-06-11 NOTE — Interval H&P Note (Signed)
History and Physical Interval Note: ? ?06/11/2021 ?8:26 AM ? ?Joe Gutierrez  has presented today for surgery, with the diagnosis of NASAL FRACTURE.  The various methods of treatment have been discussed with the patient and family. After consideration of risks, benefits and other options for treatment, the patient has consented to  Procedure(s): ?CLOSED REDUCTION NASAL FRACTURE (N/A) as a surgical intervention.  The patient's history has been reviewed, patient examined, no change in status, stable for surgery.  I have reviewed the patient's chart and labs.  Questions were answered to the patient's satisfaction.   ? ? ?[name] ? ? ?

## 2021-06-11 NOTE — Transfer of Care (Signed)
Immediate Anesthesia Transfer of Care Note ? ?Patient: Joe Gutierrez ? ?Procedure(s) Performed: CLOSED REDUCTION NASAL FRACTURE (Nose) ? ?Patient Location: PACU ? ?Anesthesia Type:General ? ?Level of Consciousness: awake and alert  ? ?Airway & Oxygen Therapy: Patient Spontanous Breathing ? ?Post-op Assessment: Report given to RN and Post -op Vital signs reviewed and stable ? ?Post vital signs: Reviewed and stable ? ?Last Vitals:  ?Vitals Value Taken Time  ?BP 131/88 06/11/21 0916  ?Temp    ?Pulse 79 06/11/21 0917  ?Resp 15 06/11/21 0917  ?SpO2 95 % 06/11/21 0917  ?Vitals shown include unvalidated device data. ? ?Last Pain:  ?Vitals:  ? 06/11/21 0713  ?TempSrc:   ?PainSc: 0-No pain  ?   ? ?  ? ?Complications: No notable events documented. ?

## 2021-06-11 NOTE — Anesthesia Procedure Notes (Signed)
Procedure Name: LMA Insertion ?Date/Time: 06/11/2021 8:44 AM ?Performed by: Dorann Lodge, CRNA ?Pre-anesthesia Checklist: Patient identified, Emergency Drugs available, Suction available and Patient being monitored ?Patient Re-evaluated:Patient Re-evaluated prior to induction ?Oxygen Delivery Method: Circle System Utilized ?Preoxygenation: Pre-oxygenation with 100% oxygen ?Induction Type: IV induction ?Ventilation: Mask ventilation without difficulty ?LMA: LMA inserted ?LMA Size: 4.0 and 5.0 ?Number of attempts: 1 ?Airway Equipment and Method: Bite block ?Placement Confirmation: positive ETCO2 ?Tube secured with: Tape ?Dental Injury: Teeth and Oropharynx as per pre-operative assessment  ? ? ? ? ?

## 2021-06-11 NOTE — Op Note (Signed)
Operative Note  ? ?DATE OF OPERATION: 06/11/2021 ? ?SURGICAL DEPARTMENT: Plastic Surgery ? ?PREOPERATIVE DIAGNOSES: Displaced nasal fractures ? ?POSTOPERATIVE DIAGNOSES:  same ? ?PROCEDURE: Closed reduction and splinting displaced nasal fractures ? ?SURGEON: Talmadge Coventry, MD ? ?ASSISTANT: Krista Blue, PA ?The advanced practice practitioner (APP) assisted throughout the case.  The APP was essential in retraction and counter traction when needed to make the case progress smoothly.  This retraction and assistance made it possible to see the tissue planes for the procedure.  The assistance was needed for hemostasis, tissue re-approximation and closure of the incision site.  ? ?ANESTHESIA:  General.  ? ?COMPLICATIONS: None.  ? ?INDICATIONS FOR PROCEDURE:  ?The patient, Joe Gutierrez is a 46 y.o. male born on 22-Sep-1975, is here for treatment of displaced nasal fractures ?MRN: [id number] ? ?CONSENT:  ?Informed consent was obtained directly from the patient. Risks, benefits and alternatives were fully discussed. Specific risks including but not limited to bleeding, infection, hematoma, seroma, scarring, pain, contracture, asymmetry, wound healing problems, and need for further surgery were all discussed. The patient did have an ample opportunity to have questions answered to satisfaction.  ? ?DESCRIPTION OF PROCEDURE:  ?The patient was taken to the operating room. SCDs were placed and antibiotics were given.  General anesthesia was administered.  The patient's operative site was prepped and draped. A time out was performed and all information was confirmed to be correct.  Started by evaluating the bony dorsum.  This appeared to be displaced towards the patient's right side.  I could not appreciate any obvious frontal sinus depression externally.  Field blocks were performed with lidocaine with epinephrine in the glabella and in V2 bilaterally.  Afrin-soaked pledgets were placed.  This was given time to work.  The  pledgets were then removed and a butter knife was used to align the nasal bones.  This appeared to straighten the bony dorsum nicely.  Doyle splints were then covered with bacitracin and inserted and secured with a nylon suture cross the septum.  An external [place] splint was then applied. ? ?The patient tolerated the procedure well.  There were no complications. The patient was allowed to wake from anesthesia, extubated and taken to the recovery room in satisfactory condition.  ? ?

## 2021-06-11 NOTE — H&P (Signed)
Reason for Consult/CC: Facial fractures ? ?Joe Gutierrez is an 46 y.o. male.  ?HPI: Patient presented to the emergency room this past weekend with facial fractures.  He had blunt trauma to the face.  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  Scan showed displaced nasal fracture and frontal sinus fracture.  He was sent to me for evaluation.  He reports normal vision and normal occlusion. ? ?Allergies:  ?Allergies  ?Allergen Reactions  ? Codeine Hives  ? Sulfa Antibiotics Hives  ? ? ?Medications:  ?Current Facility-Administered Medications:  ?  ceFAZolin (ANCEF) IVPB 2g/100 mL premix, 2 g, Intravenous, On Call to OR, [name], MD ?  lactated ringers infusion, , Intravenous, Continuous, Suzette Battiest, MD, Last Rate: 10 mL/hr at 06/11/21 0725, Continued from Pre-op at 06/11/21 0725 ? ?Past Medical History:  ?Diagnosis Date  ? Anxiety   ? Arthritis   ? Cancer [facility])   ? colon  ? Depression   ? Heart murmur   ? History of kidney stones   ? Hypertension   ? Obesity   ? Sleep apnea   ? ? ?Past Surgical History:  ?Procedure Laterality Date  ? APPENDECTOMY    ? COLONOSCOPY WITH PROPOFOL N/A 11/16/2020  ? Procedure: COLONOSCOPY WITH PROPOFOL;  Surgeon: [name], MD;  Location: [facility] ENDOSCOPY;  Service: Gastroenterology;  Laterality: N/A;  ? LAPAROSCOPIC RIGHT COLECTOMY N/A 01/24/2021  ? Procedure: LAPAROSCOPIC RIGHT COLECTOMY with Otho Ket, PA-C assisting;  Surgeon: Jules Husbands, MD;  Location: [facility] ORS;  Service: General;  Laterality: N/A;  ? LITHOTRIPSY    ? ? ?Family History  ?Problem Relation Age of Onset  ? Diabetes Mother   ? Hypertension Mother   ? Alcohol abuse Father   ? Heart disease Father   ? Hypertension Father   ? Diabetes Maternal Grandmother   ? Stroke Maternal Grandfather   ? Hypertension Brother   ? Asthma Daughter   ? Heart disease Paternal Grandfather   ? ? ?Social History:  reports that he has never smoked. He has never used smokeless tobacco. He reports that he does  not drink alcohol and does not use drugs. ? ?Physical Exam ?Blood pressure (!) 141/96, pulse 66, temperature 97.7 ?F (36.5 ?C), temperature source Oral, resp. rate 17, height '5\' 10"'$  (1.778 m), weight 115.7 kg, SpO2 97 %. ?General: No acute distress ?HEENT: Normocephalic.  No lacerations.  Extraocular movements intact.  Pupils equal round reactive to light.  Baseline occlusion.  Cranial nerves grossly intact.  Sensation intact in the V1 V2 and V3 distributions.  Patient has deviated nasal bony dorsum to the right.  Nares are patent.  No nasal septal hematoma.  No obvious contour irregularity in the area of the frontal sinus.  Still likely has some residual swelling. ? ?No results found for this or any previous visit (from the past 48 hour(s)). ? ?No results found. ? ?Assessment/Plan: ?Patient presents with displaced nasal and outer table of the frontal sinus fractures.  We discussed reduction of the nasal bones.  I did explain to him and showed him the scans regarding the frontal sinus.  Ultimately he and I both agreed that approach to the frontal sinus with fixation ultimately may not be necessary.  I explained the reason for this would be that any contour irregularity that would result from the depressed outer table could be fixed with fat grafting which would not require a incision across the glabella.  I did explain  to him that a bicoronal approach in my opinion would have higher morbidity than the depression from the outer table.  He is in agreement.  We discussed risks and benefits of closed reduction of the nasal fracture.  We discussed risks include bleeding, infection, damage to surrounding structures and need for additional procedures.  I explained that internal and external nasal splints will be applied.  All of his questions were answered and he is interested in moving forward. ? ?[name] ?06/11/2021, 8:22 AM  ? ? ? ? ? ?

## 2021-06-11 NOTE — Anesthesia Preprocedure Evaluation (Signed)
Anesthesia Evaluation  ?Patient identified by MRN, date of birth, ID band ?Patient awake ? ? ? ?Reviewed: ?Allergy & Precautions, NPO status , Patient's Chart, lab work & pertinent test results ? ?Airway ?Mallampati: II ? ?TM Distance: >3 FB ?Neck ROM: Full ? ? ? Dental ? ?(+) Dental Advisory Given ?  ?Pulmonary ?sleep apnea ,  ?  ?breath sounds clear to auscultation ? ? ? ? ? ? Cardiovascular ?hypertension, Pt. on medications ? ?Rhythm:Regular Rate:Normal ? ? ?  ?Neuro/Psych ?negative neurological ROS ?   ? GI/Hepatic ?negative GI ROS, Neg liver ROS,   ?Endo/Other  ?negative endocrine ROS ? Renal/GU ?negative Renal ROS  ? ?  ?Musculoskeletal ? ?(+) Arthritis ,  ? Abdominal ?  ?Peds ? Hematology ?negative hematology ROS ?(+)   ?Anesthesia Other Findings ? ? Reproductive/Obstetrics ? ?  ? ? ? ? ? ? ? ? ? ? ? ? ? ?  ?  ? ? ? ? ? ? ? ? ?Anesthesia Physical ?Anesthesia Plan ? ?ASA: 2 ? ?Anesthesia Plan: General  ? ?Post-op Pain Management: Tylenol PO (pre-op)* and Toradol IV (intra-op)*  ? ?Induction: Intravenous ? ?PONV Risk Score and Plan: 2 and Dexamethasone, Ondansetron and Treatment may vary due to age or medical condition ? ?Airway Management Planned: LMA ? ?Additional Equipment: None ? ?Intra-op Plan:  ? ?Post-operative Plan: Extubation in OR ? ?Informed Consent: I have reviewed the patients History and Physical, chart, labs and discussed the procedure including the risks, benefits and alternatives for the proposed anesthesia with the patient or authorized representative who has indicated his/her understanding and acceptance.  ? ? ? ?Dental advisory given ? ?Plan Discussed with:  ? ?Anesthesia Plan Comments:   ? ? ? ? ? ? ?Anesthesia Quick Evaluation ? ?

## 2021-06-12 ENCOUNTER — Encounter (HOSPITAL_COMMUNITY): Payer: Self-pay | Admitting: Plastic Surgery

## 2021-06-12 NOTE — Anesthesia Postprocedure Evaluation (Signed)
Anesthesia Post Note ? ?Patient: Joe Gutierrez ? ?Procedure(s) Performed: CLOSED REDUCTION NASAL FRACTURE (Nose) ? ?  ? ?Patient location during evaluation: PACU ?Anesthesia Type: General ?Level of consciousness: awake and alert ?Pain management: pain level controlled ?Vital Signs Assessment: post-procedure vital signs reviewed and stable ?Respiratory status: spontaneous breathing, nonlabored ventilation, respiratory function stable and patient connected to nasal cannula oxygen ?Cardiovascular status: blood pressure returned to baseline and stable ?Postop Assessment: no apparent nausea or vomiting ?Anesthetic complications: no ? ? ?No notable events documented. ? ?Last Vitals:  ?Vitals:  ? 06/11/21 1000 06/11/21 1014  ?BP: (!) 164/109 (!) 152/100  ?Pulse: 68 68  ?Resp: 14 13  ?Temp:  36.7 ?C  ?SpO2: 97% 98%  ?  ?Last Pain:  ?Vitals:  ? 06/11/21 1014  ?TempSrc:   ?PainSc: 0-No pain  ? ? ?  ?  ?  ?  ?  ?  ? ?Suzette Battiest E ? ? ? ? ?

## 2021-06-15 NOTE — Patient Instructions (Incomplete)

## 2021-06-17 ENCOUNTER — Telehealth: Payer: Self-pay

## 2021-06-17 NOTE — Telephone Encounter (Signed)
Reached out to patient via MyChart message to clarify what patient needs an appointment for per Jolene.  ?

## 2021-06-17 NOTE — Telephone Encounter (Signed)
-----   Message from Venita Lick, NP sent at 06/13/2021  6:36 PM EDT ----- ?Can we see what he needs removed from nose next week.  It says stint removal nose, do they mean stiches or packing? If exterior stitches I can remove or packing.  If more complex needs he may need to go to plastics who performed surgery.  Thanks. ? ?

## 2021-06-17 NOTE — Telephone Encounter (Signed)
Destiny from pt's PCP office, [facility] called to request for someone from our office to contact pt to schedule a f/u appt as he has been unsuccessful with scheduling an appt. Pt told PCP that someone from our office advised him to f/u with this PCP however, PCP told him he needed to f/u with [place] because she cannot take out the stents in her office. She doesn't have the equipment to do that.  ?

## 2021-06-18 ENCOUNTER — Ambulatory Visit: Payer: Managed Care, Other (non HMO) | Admitting: Nurse Practitioner

## 2021-06-19 ENCOUNTER — Ambulatory Visit (INDEPENDENT_AMBULATORY_CARE_PROVIDER_SITE_OTHER): Payer: Managed Care, Other (non HMO) | Admitting: Plastic Surgery

## 2021-06-19 DIAGNOSIS — S022XXD Fracture of nasal bones, subsequent encounter for fracture with routine healing: Secondary | ICD-10-CM

## 2021-06-19 NOTE — Progress Notes (Signed)
Patient presents about 1 week out from closed reduction of nasal fracture.  He feels like things are coming along okay.  On exam he still has his dorsal splint in place.  I removed the internal splints and everything looks to be healing appropriately.  I do not see any sort of indentation in the area of the frontal sinus and so we are both happy with her choice to not operate on that at this point.  I have asked him to leave the dorsal splint in place for another few days to try to get some additional stabilization of the fractures and we will see him again in about a month to check his result.  He is still complaining of some watering in the eyes and I advised him to use some Afrin nasal spray and give some time for the edema to resolve and hopefully that will get better on its own.  All of his questions were answered. ?

## 2021-06-24 ENCOUNTER — Telehealth: Payer: Self-pay | Admitting: Plastic Surgery

## 2021-06-24 NOTE — Telephone Encounter (Signed)
Spoke to [place] regarding the tearing.Marland Kitchen He stated to give a little more time due to the swelling. If it is still tearing by next appointment we will refer to ENT. Informed pt-pt conveyed understanding.  ?

## 2021-06-24 NOTE — Telephone Encounter (Signed)
Pt is calling in stating that his L eye is still tearing up and he would like to know what he needs to do if he needs to come in to the office or go to a ENT or a ophthalmologist.  Pt would like to have a call back to let him know what he needs to do. ?

## 2021-06-28 ENCOUNTER — Other Ambulatory Visit: Payer: Managed Care, Other (non HMO)

## 2021-06-28 DIAGNOSIS — R7989 Other specified abnormal findings of blood chemistry: Secondary | ICD-10-CM

## 2021-06-30 NOTE — Progress Notes (Signed)
Good morning crew, please let Joe Gutierrez know his total testosterone is still low, in fact lower this check then previous.  Free Testosterone has not returned as of yet.  I do recommend that we get him in to see urology though since we are still seeing low testosterone total levels -- this can lead to fatigue, difficulty with mood, and more.  Would you like to go and see urology?  If so I will place referral. ?Keep being stellar!!  Thank you for allowing me to participate in your care.  I appreciate you. ?Kindest regards, ?Ashelynn Marks ?

## 2021-07-01 NOTE — Addendum Note (Signed)
Addended by: Marnee Guarneri T on: 07/01/2021 03:38 PM ? ? Modules accepted: Orders ? ?

## 2021-07-05 ENCOUNTER — Encounter: Payer: Self-pay | Admitting: Urology

## 2021-07-05 ENCOUNTER — Other Ambulatory Visit
Admission: RE | Admit: 2021-07-05 | Discharge: 2021-07-05 | Disposition: A | Discharge status: Home / Self Care | Payer: Managed Care, Other (non HMO) | Attending: Urology | Admitting: Urology

## 2021-07-05 ENCOUNTER — Ambulatory Visit (INDEPENDENT_AMBULATORY_CARE_PROVIDER_SITE_OTHER): Payer: Managed Care, Other (non HMO) | Admitting: Urology

## 2021-07-05 VITALS — BP 142/94 | HR 56 | Ht 70.0 in | Wt 250.0 lb

## 2021-07-05 DIAGNOSIS — N529 Male erectile dysfunction, unspecified: Secondary | ICD-10-CM

## 2021-07-05 DIAGNOSIS — R7989 Other specified abnormal findings of blood chemistry: Secondary | ICD-10-CM

## 2021-07-05 DIAGNOSIS — E291 Testicular hypofunction: Secondary | ICD-10-CM | POA: Diagnosis not present

## 2021-07-05 LAB — TESTOSTERONE, FREE, TOTAL, SHBG
Sex Hormone Binding: 30.1 nmol/L (ref 16.5–55.9)
Testosterone, Free: 9.1 pg/mL (ref 6.8–21.5)
Testosterone: 236 ng/dL — ABNORMAL LOW (ref 264–916)

## 2021-07-05 MED ORDER — CLOMID 50 MG PO TABS: 25.0000 mg | ORAL_TABLET | Freq: Every day | ORAL | 3 refills | Status: DC

## 2021-07-05 NOTE — Progress Notes (Signed)
07/05/21 5:36 PM   Joe Gutierrez Apr 03, 1975 [id number]  Referring provider:  Venita Lick, NP [address] [place],  [place] [address] Chief Complaint  Patient presents with   Hypogonadism     HPI: Joe Gutierrez is a 46 y.o.male for further evaluation of hypogonadism.   He had his testosterone drawn on 05/29/2021 it was 258. He had it redrawn on 06/28/2021 and was 236.   He was seen decreased orgasm and mild ED. He has decreased libido increasing fatigue but he also has stressors. He clarified that he does not have colon cancer but he is precancer.   He also reports that he has had some mild to moderate ED.  He does have a prescription for sildenafil but is never used it.  He has children and not interested in further pregnancies.  They are using contraception.    Androgen Deficiency in the Aging Male     Joe Gutierrez 07/05/21 1500         Androgen Deficiency in the Aging Male   Do you have a decrease in libido (sex drive) Yes     Do you have lack of energy Yes     Do you have a decrease in strength and/or endurance No     Have you lost height No     Have you noticed a decreased "enjoyment of life" Yes     Are you sad and/or grumpy Yes     Are your erections less strong Yes     Have you noticed a recent deterioration in your ability to play sports No     Are you falling asleep after dinner No     Has there been a recent deterioration in your work performance No              SHIM     Row Gutierrez 07/05/21 1503         SHIM: Over the last 6 months:   How do you rate your confidence that you could get and keep an erection? Low     When you had erections with sexual stimulation, how often were your erections hard enough for penetration (entering your partner)? Most Times (much more than half the time)     During sexual intercourse, how often were you able to maintain your erection after you had penetrated (entered) your partner? Sometimes (about half the time)      During sexual intercourse, how difficult was it to maintain your erection to completion of intercourse? Difficult     When you attempted sexual intercourse, how often was it satisfactory for you? A Few Times (much less than half the time)       SHIM Total Score   SHIM 14               PMH: Past Medical History:  Diagnosis Date   Anxiety    Arthritis    Colon polyp    Depression    Heart murmur    History of kidney stones    Hypertension    Obesity    Sleep apnea     Surgical History: Past Surgical History:  Procedure Laterality Date   APPENDECTOMY     CLOSED REDUCTION NASAL FRACTURE N/A 06/11/2021   Procedure: CLOSED REDUCTION NASAL FRACTURE;  Surgeon: [name], MD;  Location: [facility] [facility];  Service: Plastics;  Laterality: N/A;   COLONOSCOPY WITH PROPOFOL N/A 11/16/2020   Procedure: COLONOSCOPY WITH PROPOFOL;  Surgeon:  [name], MD;  Location: [facility];  Service: Gastroenterology;  Laterality: N/A;   LAPAROSCOPIC RIGHT COLECTOMY N/A 01/24/2021   Procedure: LAPAROSCOPIC RIGHT COLECTOMY with Otho Ket, PA-C assisting;  Surgeon: Jules Husbands, MD;  Location: [facility] ORS;  Service: General;  Laterality: N/A;   LITHOTRIPSY      Home Medications:  Allergies as of 07/05/2021       Reactions   Codeine Hives   Sulfa Antibiotics Hives        Medication List        Accurate as of Jul 05, 2021 11:59 PM. If you have any questions, ask your nurse or doctor.          acetaminophen 500 MG tablet Commonly known as: TYLENOL Take 1,000 mg by mouth every 6 (six) hours as needed for moderate pain.   buPROPion 150 MG 24 hr tablet Commonly known as: Wellbutrin XL Take 1 tablet (150 mg total) by mouth daily.   busPIRone 5 MG tablet Commonly known as: BUSPAR Take 1 tablet (5 mg total) by mouth 2 (two) times daily.   Clomid 50 MG tablet Generic drug: clomiPHENE Take 0.5 tablets (25 mg total) by mouth daily. Started by: Hollice Espy, MD   lisinopril 40 MG  tablet Commonly known as: ZESTRIL TAKE 1 TABLET(40 MG) BY MOUTH DAILY   methylPREDNISolone 4 MG Tbpk tablet Commonly known as: MEDROL DOSEPAK Take as instructed on package.   morphine 15 MG tablet Commonly known as: MSIR Take 0.5 tablets (7.5 mg total) by mouth every 4 (four) hours as needed for severe pain. What changed: how much to take   ondansetron 4 MG disintegrating tablet Commonly known as: ZOFRAN-ODT Take 1 tablet (4 mg total) by mouth every 8 (eight) hours as needed for nausea or vomiting.   oxyCODONE 5 MG immediate release tablet Commonly known as: Roxicodone Take 1 tablet (5 mg total) by mouth every 8 (eight) hours as needed.   sertraline 100 MG tablet Commonly known as: ZOLOFT Take 2 tablets (200 mg total) by mouth daily.   Vitamin D (Cholecalciferol) 25 MCG (1000 UT) Tabs Take 1,000 Units by mouth daily.        Allergies:  Allergies  Allergen Reactions   Codeine Hives   Sulfa Antibiotics Hives    Family History: Family History  Problem Relation Age of Onset   Diabetes Mother    Hypertension Mother    Alcohol abuse Father    Heart disease Father    Hypertension Father    Diabetes Maternal Grandmother    Stroke Maternal Grandfather    Hypertension Brother    Asthma Daughter    Heart disease Paternal Grandfather     Social History:  reports that he has never smoked. He has never used smokeless tobacco. He reports that he does not drink alcohol and does not use drugs.   Physical Exam: BP (!) 142/94   Pulse (!) 56   Ht '5\' 10"'$  (1.778 m)   Wt 250 lb (113.4 kg)   BMI 35.87 kg/m   Constitutional:  Alert and oriented, No acute distress. HEENT: [place] AT, moist mucus membranes.  Trachea midline, no masses. Cardiovascular: No clubbing, cyanosis, or edema. Respiratory: Normal respiratory effort, no increased work of breathing. Skin: No rashes, bruises or suspicious lesions. Neurologic: Grossly intact, no focal deficits, moving all 4  extremities. Psychiatric: Normal mood and affect.  Laboratory Data:  Lab Results  Component Value Date   CREATININE 0.83 05/29/2021   Lab Results  Component Value Date   HGBA1C 5.1 05/29/2021    Assessment & Plan:    Hypogonadism  -We discussed different treatment options such as medication, injection, or gel.  We discussed risk and benefits for each.  Was also discussed an alternative, off label use of Clomid to boost natural testosterone. -Will draw labs for [facility], and prolactin to rule out any other underlying conditions, TSH and all other labs appear to be appropriate.  PSA is also up-to-date. -25 mg of Clomid daily, will check testosterone and LFTs in 3 months  2.  Erectile dysfunction -We discussed that low testosterone and erectile dysfunction do share some overlap in terms of diagnoses and function but are also mutually exclusive.  We discussed if his erections do not improve with increased testosterone levels, consider trying sildenafil.  We discussed optimal timing and administration.  Follow-up labs in 3 months  Conley Rolls as a scribe for Hollice Espy, MD.,have documented all relevant documentation on the behalf of Hollice Espy, MD,as directed by  Hollice Espy, MD while in the presence of Hollice Espy, [place] [address], [place], [place] [address] [phone]

## 2021-07-06 LAB — PROLACTIN: Prolactin: 6.4 ng/mL (ref 4.0–15.2)

## 2021-07-06 LAB — FSH/LH
FSH: 3 m[IU]/mL (ref 1.5–12.4)
LH: 3 m[IU]/mL (ref 1.7–8.6)

## 2021-07-07 DIAGNOSIS — R7989 Other specified abnormal findings of blood chemistry: Secondary | ICD-10-CM | POA: Insufficient documentation

## 2021-07-07 NOTE — Patient Instructions (Signed)

## 2021-07-08 ENCOUNTER — Other Ambulatory Visit: Payer: Self-pay

## 2021-07-08 DIAGNOSIS — R7989 Other specified abnormal findings of blood chemistry: Secondary | ICD-10-CM

## 2021-07-10 ENCOUNTER — Encounter: Payer: Self-pay | Admitting: Nurse Practitioner

## 2021-07-10 ENCOUNTER — Ambulatory Visit (INDEPENDENT_AMBULATORY_CARE_PROVIDER_SITE_OTHER): Payer: Managed Care, Other (non HMO) | Admitting: Nurse Practitioner

## 2021-07-10 ENCOUNTER — Telehealth: Payer: Self-pay | Admitting: Urology

## 2021-07-10 VITALS — BP 118/84 | HR 61 | Temp 97.8°F | Ht 70.0 in | Wt 257.6 lb

## 2021-07-10 DIAGNOSIS — M7541 Impingement syndrome of right shoulder: Secondary | ICD-10-CM | POA: Diagnosis not present

## 2021-07-10 DIAGNOSIS — F418 Other specified anxiety disorders: Secondary | ICD-10-CM

## 2021-07-10 DIAGNOSIS — R7989 Other specified abnormal findings of blood chemistry: Secondary | ICD-10-CM | POA: Diagnosis not present

## 2021-07-10 MED ORDER — LISINOPRIL 40 MG PO TABS: ORAL_TABLET | ORAL | 4 refills | Status: DC

## 2021-07-10 MED ORDER — CLOMID 50 MG PO TABS: 25.0000 mg | ORAL_TABLET | Freq: Every day | ORAL | 3 refills | Status: AC

## 2021-07-10 NOTE — Progress Notes (Signed)
BP 118/84 (BP Location: Left Arm, Patient Position: Sitting, Cuff Size: Normal)   Pulse 61   Temp 97.8 F (36.6 C) (Oral)   Ht '5\' 10"'$  (1.778 m)   Wt 257 lb 9.6 oz (116.8 kg)   SpO2 97%   BMI 36.96 kg/m    Subjective:    Patient ID: Joe Gutierrez, male    DOB: [birth year], 46 y.o.   MRN: [id number]  HPI: Joe Gutierrez is a 46 y.o. male  Chief Complaint  Patient presents with   Mood    Patient states his mood is gotten a lot better.    Numbness    Patient is here for six week follow up on Numbness. Patient states the numbness he is experiencing in his R hand is still there. Patient states the numbness sensation is constant.    DEPRESSION Follow-up for mood today, added on Wellbutrin XL 150 MG at visit on 05/29/21 which he reports improvement in mood and motivation at this time.  Continues on Sertraline 200 MG daily + Buspar 5 BID.  Currently being followed by urology for low testosterone levels and was ordered Clomid, has not started yet due to no supply at pharmacy.   Mood status: stable Satisfied with current treatment?: yes Symptom severity: mild  Duration of current treatment : chronic Side effects: no Medication compliance: good compliance Psychotherapy/counseling: none Previous psychiatric medications: Paxil and Buspar Depressed mood: none Anxious mood: none Anhedonia: none Significant weight loss or gain: no Insomnia: yes, about 5 hours of sleep Fatigue: about the same Feelings of worthlessness or guilt: none Impaired concentration/indecisiveness: better Suicidal ideations: no Hopelessness: none Crying spells: no     07/10/2021    9:10 AM 05/29/2021    9:49 AM 11/26/2020    8:26 AM 10/01/2020    2:35 PM 08/01/2020    3:33 PM  Depression screen PHQ 2/9  Decreased Interest 1 2 0 1 1  Down, Depressed, Hopeless 1 2 0 0 1  PHQ - 2 Score 2 4 0 1 2  Altered sleeping 2 3 0 0 2  Tired, decreased energy '2 3 1 '$ 0 0  Change in appetite 2 3 0 0 1  Feeling bad or  failure about yourself  1 2 0 0 0  Trouble concentrating '1 2 1 1 1  '$ Moving slowly or fidgety/restless 1 2 0 0 0  Suicidal thoughts 0 1 0 0 0  PHQ-9 Score '11 20 2 2 6  '$ Difficult doing work/chores   Not difficult at all Not difficult at all Somewhat difficult       07/10/2021    9:10 AM 05/29/2021    9:49 AM 10/01/2020    2:09 PM 08/01/2020    3:58 PM  GAD 7 : Generalized Anxiety Score  Nervous, Anxious, on Edge 1 3 0 1  Control/stop worrying 1 2 0 1  Worry too much - different things 1 2 0 1  Trouble relaxing '1 2 1 1  '$ Restless 1 2 0 1  Easily annoyed or irritable '1 3 1 1  '$ Afraid - awful might happen 0 1 0 1  Total GAD 7 Score '6 15 2 7  '$ Anxiety Difficulty  Very difficult Not difficult at all Somewhat difficult   NUMBNESS Follow-up for numbness, provided steroid pack on 05/29/21.  Right thumb and up to shoulder.  Did have a crick in right side of neck a couple weeks prior to this starting.  He feels like the thumb at  this time is getting worse.  Continues to be present up in right shoulder to thumb.  When grips something it does not feel the same, strength is there just can not feel. Duration: weeks Onset: sudden Location: right side Bilateral: no Symmetric: no Decreased sensation: no  Weakness: no Pain: no Quality:  numbness Severity: moderate Frequency: constant Trauma: no Recent illness: no Diabetes: none Thyroid disease: no  HIV: no  Alcoholism: no  Spinal cord injury: no Alleviating factors: nothing Aggravating factors: unknown Status: worsening Treatments attempted:  Prednisone taper  Relevant past medical, surgical, family and social history reviewed and updated as indicated. Interim medical history since our last visit reviewed. Allergies and medications reviewed and updated.  Review of Systems  Constitutional:  Negative for activity change, diaphoresis, fatigue and fever.  Respiratory:  Negative for cough, chest tightness, shortness of breath and wheezing.    Cardiovascular:  Negative for chest pain, palpitations and leg swelling.  Gastrointestinal: Negative.   Endocrine: Negative.   Neurological:  Positive for numbness. Negative for dizziness, tremors, syncope, facial asymmetry, weakness and headaches.  Psychiatric/Behavioral:  Positive for decreased concentration and sleep disturbance. Negative for self-injury and suicidal ideas. The patient is nervous/anxious.    Per HPI unless specifically indicated above     Objective:    BP 118/84 (BP Location: Left Arm, Patient Position: Sitting, Cuff Size: Normal)   Pulse 61   Temp 97.8 F (36.6 C) (Oral)   Ht '5\' 10"'$  (1.778 m)   Wt 257 lb 9.6 oz (116.8 kg)   SpO2 97%   BMI 36.96 kg/m   Wt Readings from Last 3 Encounters:  07/10/21 257 lb 9.6 oz (116.8 kg)  07/05/21 250 lb (113.4 kg)  06/11/21 255 lb (115.7 kg)    Physical Exam Vitals and nursing note reviewed.  Constitutional:      General: He is awake. He is not in acute distress.    Appearance: He is well-developed, well-groomed and overweight. He is not ill-appearing.  HENT:     Head: Normocephalic and atraumatic.     Right Ear: Hearing normal. No drainage.     Left Ear: Hearing normal. No drainage.  Eyes:     General: Lids are normal.        Right eye: No discharge.        Left eye: No discharge.     Conjunctiva/sclera: Conjunctivae normal.     Pupils: Pupils are equal, round, and reactive to light.  Neck:     Vascular: No carotid bruit.  Cardiovascular:     Rate and Rhythm: Normal rate and regular rhythm.     Heart sounds: Normal heart sounds, S1 normal and S2 normal. No murmur heard.   No gallop.  Pulmonary:     Effort: Pulmonary effort is normal. No accessory muscle usage or respiratory distress.     Breath sounds: Normal breath sounds.  Abdominal:     General: Bowel sounds are normal.     Palpations: Abdomen is soft.  Musculoskeletal:     Right shoulder: No swelling, tenderness or crepitus. Normal range of motion.  Normal strength.     Left shoulder: Normal.     Right wrist: No swelling, tenderness, bony tenderness or crepitus. Normal range of motion.     Left wrist: Normal.     Right hand: No swelling, tenderness or bony tenderness. Normal range of motion. Normal strength. Decreased sensation of the radial distribution. Normal capillary refill.     Left hand: Normal.  Cervical back: Normal range of motion and neck supple.     Right knee: Normal.     Left knee: Normal.     Right lower leg: No edema.     Left lower leg: No edema.  Skin:    General: Skin is warm and dry.  Neurological:     Mental Status: He is alert and oriented to person, place, and time.     Cranial Nerves: Cranial nerves 2-12 are intact.     Motor: Motor function is intact.     Coordination: Coordination is intact.     Gait: Gait is intact.     Deep Tendon Reflexes: Reflexes are normal and symmetric.     Comments: Negative Tinel and Phalen.  Psychiatric:        Attention and Perception: Attention normal.        Mood and Affect: Mood normal.        Speech: Speech normal.        Behavior: Behavior normal. Behavior is cooperative.    Results for orders placed or performed during the hospital encounter of 07/05/21  FSH/LH  Result Value Ref Range   LH 3.0 1.7 - 8.6 mIU/mL   FSH 3.0 1.5 - 12.4 mIU/mL  Prolactin  Result Value Ref Range   Prolactin 6.4 4.0 - 15.2 ng/mL      Assessment & Plan:   Problem List Items Addressed This Visit       Musculoskeletal and Integument   Coracoid impingement of right shoulder    Acute and ongoing with no improvement -- will place referral for physical therapy since Prednisone offered no benefit.  Discussed with patient and he is in agreeance with this plan.  If no improvement after PT then will send to ortho.         Relevant Orders   Ambulatory referral to Physical Therapy     Other   Depression with anxiety - Primary    Chronic, ongoing.  Denies SI/HI. PHQ9 = 11 and GAD7 = 6  today. Continue Sertraline to 200 MG (max dose) and Buspar 5 MG BID + Wellbutrin XL 150 MG daily, Wellbutrin is offering benefit and scores improving.  Continue to monitor and adjust as needed.       Low testosterone in male    New diagnosis, will continue to follow along with urology -- suspect may be able to decrease mood medications once testosterone levels improved.         Follow up plan: Return in about 5 weeks (around 08/14/2021) for Right shoulder numbness.

## 2021-07-10 NOTE — Telephone Encounter (Signed)
[facility] called stating [facility] isn't able to refill as it has been on back order.  clomiPHENE (CLOMID) 50 MG tablet  Where can he get this medication filled? Please advise

## 2021-07-10 NOTE — Telephone Encounter (Signed)
Spoke with patient, sent Clomid to [facility]

## 2021-07-10 NOTE — Assessment & Plan Note (Signed)
Chronic, ongoing.  Denies SI/HI. PHQ9 = 11 and GAD7 = 6 today. Continue Sertraline to 200 MG (max dose) and Buspar 5 MG BID + Wellbutrin XL 150 MG daily, Wellbutrin is offering benefit and scores improving.  Continue to monitor and adjust as needed.

## 2021-07-10 NOTE — Assessment & Plan Note (Signed)
Acute and ongoing with no improvement -- will place referral for physical therapy since Prednisone offered no benefit.  Discussed with patient and he is in agreeance with this plan.  If no improvement after PT then will send to ortho.

## 2021-07-10 NOTE — Assessment & Plan Note (Signed)
New diagnosis, will continue to follow along with urology -- suspect may be able to decrease mood medications once testosterone levels improved.

## 2021-07-24 ENCOUNTER — Ambulatory Visit (INDEPENDENT_AMBULATORY_CARE_PROVIDER_SITE_OTHER): Payer: Self-pay | Admitting: Plastic Surgery

## 2021-07-24 ENCOUNTER — Encounter: Payer: Self-pay | Admitting: Plastic Surgery

## 2021-07-24 DIAGNOSIS — S022XXD Fracture of nasal bones, subsequent encounter for fracture with routine healing: Secondary | ICD-10-CM

## 2021-07-24 DIAGNOSIS — H04212 Epiphora due to excess lacrimation, left lacrimal gland: Secondary | ICD-10-CM

## 2021-07-24 NOTE — Addendum Note (Signed)
Addended by: [name] on: 07/24/2021 05:21 PM   Modules accepted: Level of Service

## 2021-07-24 NOTE — Progress Notes (Signed)
   Referring Provider Venita Lick, NP [place],  [place] [address]   CC:  Chief Complaint  Patient presents with   Follow-up      Joe Gutierrez is an 46 y.o. male.  HPI: Patient presents about 6 weeks postop from closed reduction of nasal fracture.  This was from direct trauma from softball.  He feels like he is continuing to have watering in his left eye and difficulty with his sense of smell.  Review of Systems General: Denies fevers and chills  Physical Exam    07/10/2021    9:33 AM 07/10/2021    9:04 AM 07/05/2021    3:02 PM  Vitals with BMI  Height  '5\' 10"'$  '5\' 10"'$   Weight  257 lbs 10 oz 250 lbs  BMI  07.62 26.33  Systolic 354 562 563  Diastolic 84 91 94  Pulse  61 56    General:  No acute distress,  Alert and oriented, Non-Toxic, Normal speech and affect Examination shows reasonably well aligned nasal dorsum.  There is perhaps some slight deviation of the nasal bones to his right side.  Otherwise nares are patent and is well-healed.  Minimal if any step-off in the area of the frontal sinus outer table fracture.  Assessment/Plan Patient continues to have watery eyes after closed reduction of a nasal fracture particularly on the left side.  I will plan to refer him to oculoplastics for this.  Regarding his sense of smell I do think it will improve to some degree but may always be somewhat changed.  We discussed the anatomical rationale for this and he is understanding.  I do not think that there is anything that can be done surgically to improve or affect that.  We will plan to follow-up on the oculoplastics consult and go from there.  All of his questions were answered.  [name] 07/24/2021, 12:33 PM

## 2021-08-14 ENCOUNTER — Ambulatory Visit: Payer: Self-pay | Admitting: Nurse Practitioner

## 2021-08-21 ENCOUNTER — Encounter: Payer: Self-pay | Admitting: Physical Therapy

## 2021-08-21 ENCOUNTER — Other Ambulatory Visit: Payer: Self-pay

## 2021-08-21 ENCOUNTER — Ambulatory Visit: Payer: 59 | Attending: Nurse Practitioner | Admitting: Physical Therapy

## 2021-08-21 DIAGNOSIS — M7541 Impingement syndrome of right shoulder: Secondary | ICD-10-CM | POA: Insufficient documentation

## 2021-08-21 DIAGNOSIS — R200 Anesthesia of skin: Secondary | ICD-10-CM | POA: Diagnosis present

## 2021-08-21 DIAGNOSIS — R202 Paresthesia of skin: Secondary | ICD-10-CM | POA: Diagnosis present

## 2021-08-21 DIAGNOSIS — M5412 Radiculopathy, cervical region: Secondary | ICD-10-CM | POA: Insufficient documentation

## 2021-08-21 NOTE — Therapy (Signed)
OUTPATIENT PHYSICAL THERAPY SHOULDER EVALUATION   Patient Name: Joe Gutierrez MRN: [id number] DOB:Jul 10, 1975, 46 y.o., male Today's Date: 08/22/2021   PT End of Session - 08/22/21 1043     Visit Number 1    Number of Visits 16    Date for PT Re-Evaluation 10/17/21    Authorization Type Self Pay    PT Start Time 1800    PT Stop Time 1845    PT Time Calculation (min) 45 min    Activity Tolerance Patient tolerated treatment well    Behavior During Therapy WFL for tasks assessed/performed             Past Medical History:  Diagnosis Date   Anxiety    Arthritis    Colon polyp    Depression    Heart murmur    History of kidney stones    Hypertension    Obesity    Sleep apnea    Past Surgical History:  Procedure Laterality Date   APPENDECTOMY     CLOSED REDUCTION NASAL FRACTURE N/A 06/11/2021   Procedure: CLOSED REDUCTION NASAL FRACTURE;  Surgeon: [name], MD;  Location: [facility];  Service: Plastics;  Laterality: N/A;   COLONOSCOPY WITH PROPOFOL N/A 11/16/2020   Procedure: COLONOSCOPY WITH PROPOFOL;  Surgeon: [name], MD;  Location: [facility] ENDOSCOPY;  Service: Gastroenterology;  Laterality: N/A;   LAPAROSCOPIC RIGHT COLECTOMY N/A 01/24/2021   Procedure: LAPAROSCOPIC RIGHT COLECTOMY with Otho Ket, PA-C assisting;  Surgeon: Jules Husbands, MD;  Location: [facility];  Service: General;  Laterality: N/A;   LITHOTRIPSY     Patient Active Problem List   Diagnosis Date Noted   Coracoid impingement of right shoulder 07/10/2021   Low testosterone in male 07/07/2021   Type O blood, Rh positive 05/25/2021   S/P laparoscopic colectomy 01/24/2021   Erectile dysfunction 11/26/2020   Abnormal colonoscopy 11/26/2020   Elevated low density lipoprotein (LDL) cholesterol level 11/24/2020   Dupuytren's disease of palm of left hand 07/28/2020   IFG (impaired fasting glucose) 03/23/2018   Vitamin D deficiency 03/23/2018   Sleep apnea 01/13/2017   Depression with anxiety  11/10/2016   Obesity 11/10/2016   Essential hypertension, benign 08/27/2015   Benign prostatic hyperplasia without lower urinary tract symptoms 05/03/2012    PCP: Dr. Ned Card   REFERRING PROVIDER: Dr. Maryln Gottron DIAG: M75.41 (ICD-10-CM) - Coracoid impingement of right shoulder  THERAPY DIAG:  Radiculopathy, cervical region  Numbness and tingling of right thumb  Rationale for Evaluation and Treatment Rehabilitation  ONSET DATE: 05/22/21  SUBJECTIVE:  SUBJECTIVE STATEMENT: Pt reports that numbness and tingling started down his right shoulder down to his right thumb. This numbness and tingling is constant.  PERTINENT HISTORY: Per Dr. Suezanne Jacquet note on 07/10/21    Musculoskeletal and Integument   Coracoid impingement of right shoulder      Acute and ongoing with no improvement -- will place referral for physical therapy since Prednisone offered no benefit.  Discussed with patient and he is in agreeance with this plan.  If no improvement after PT then will send to ortho.    PAIN:  Are you having pain? No  PRECAUTIONS: None  WEIGHT BEARING RESTRICTIONS No  FALLS:  Has patient fallen in last 6 months? No  LIVING ENVIRONMENT: Lives with: lives with their family Lives in: House/apartment Stairs: Yes: External: 3 steps; can reach both Has following equipment at home: None  OCCUPATION: Maintenance and uses hands a lot   PLOF: Independent  PATIENT GOALS Wants numbness and tingling in right thumb to go away   OBJECTIVE:               VITALS: BP 144/97 HR 80 SpO2 99  DIAGNOSTIC FINDINGS:  None listed for this impairment   PATIENT SURVEYS:  FOTO 59/71   COGNITION:  Overall cognitive status: Within functional limits for tasks assessed     SENSATION: Light touch: Impaired  and N/T  in right thumb that extends upwards lateral side of forearm to right shoulder   POSTURE: Forward Flexed Rounded Shoulders   UPPER EXTREMITY ROM:   ROM: Cervical.                                                            AROM   PROM          NORM               flex:     50                                 50                     ext:     60                                 60           AROM R/L      PROM                          NORM     SB:         45 /45                                                     45  rot:          70/70             70/70  70-90        Active ROM Right eval Left eval  Shoulder flexion 180 180  Shoulder extension 60 60  Shoulder abduction 180  180  Shoulder adduction    Shoulder internal rotation 70 70  Shoulder external rotation 90 90  Elbow flexion 150 150  Elbow extension 60 60  Wrist flexion 80 80  Wrist extension 70 70  Wrist ulnar deviation 30 30  Wrist radial deviation 20 20  Wrist pronation    Wrist supination    (Blank rows = not tested)      MMT    Active ROM Right eval Left eval  Shoulder flexion 4+ 4+  Shoulder extension 5 5  Shoulder abduction 4+ 4+  Shoulder adduction    Shoulder internal rotation 4+ 4+  Shoulder external rotation 4+ 4+  Elbow flexion 5 5  Elbow extension 5 5  Wrist flexion 5 5  Wrist extension 5 5  Wrist ulnar deviation    Wrist radial deviation    Wrist pronation    Wrist supination    (Blank rows = not tested)                                               Grip Strength R/L Good Good                                               Mid Trap R/L 4+/4+ Lower Trap R/L 4-/4-             CERVICAL SPECIAL TESTS:             Spurling's: + R                       Upper Limb Tension: + R                       Cervical Traction: +  SHOULDER SPECIAL TESTS:  Impingement tests: Painful arc test: negative  SLAP lesions:  Not performed   Instability tests:  Not performed    Rotator cuff assessment: Drop arm test: negative, Full can test: negative, and Infraspinatus test: negative  Biceps assessment:  Negative   JOINT MOBILITY TESTING:  Not tested   PALPATION:  Right lateral forearm and right thumb    TODAY'S TREATMENT:  Radial Nerve Glides 3 x 10  PNF D2 Flexion with Yellow TB 3 x 10    PATIENT EDUCATION: Education details: form and technique for appropriate exercise and explanation about cervical radiculopathy  Person educated: Patient Education method: Explanation, Demonstration, Verbal cues, and Handouts Education comprehension: verbalized understanding, returned demonstration, verbal cues required, and tactile cues required   HOME EXERCISE PROGRAM: Access Code: [email or web address] URL: [email or web address] Date: 08/21/2021 Prepared by: Bradly Chris  Exercises - Standing Radial Nerve Glide  - 1 x daily - 7 x weekly - 3 sets - 10 reps - Standing Shoulder Single Arm PNF D2 Flexion with Resistance  - 1 x daily - 3 x weekly - 3 sets - 10 reps  ASSESSMENT:  CLINICAL IMPRESSION: Patient is a 46 y.o. white male who was seen today for physical therapy evaluation and treatment for right sided shoulder, forearm and thumb numbness  and tingling. He exhibits signs and symptoms of right sided cervical radiculopathy with positive cervical special test cluster and numbness and tingling that radiates from neck down lateral side of right shoulder through forearm into thumb. RTC and forearm musculature ruled out during examination. His deficits include impaired sensation over right thumb and lateral forearm. He will benefit from skilled PT to decrease n/t to carry out reaching a gripping tasks without experiencing symptoms.       OBJECTIVE IMPAIRMENTS decreased strength, impaired sensation, and postural dysfunction.   ACTIVITY LIMITATIONS carrying and lifting  PARTICIPATION LIMITATIONS: occupation  PERSONAL FACTORS Profession and 1 comorbidity:  anxiety   are also affecting patient's functional outcome.   REHAB POTENTIAL: Good  CLINICAL DECISION MAKING: Stable/uncomplicated  EVALUATION COMPLEXITY: Low   GOALS: Goals reviewed with patient? No  SHORT TERM GOALS: Target date: 09/05/2021  (Remove Blue Hyperlink)  Pt will be independent with HEP in order to improve strength and balance in order to decrease fall risk and improve function at home and work. Baseline: NT  Goal status: INITIAL   LONG TERM GOALS: Target date: 10/17/2021  (Remove Blue Hyperlink)  Patient will have improved function and activity level as evidenced by an increase in FOTO score by 10 points or more.  Baseline: 59 Goal status: INITIAL  2.  Patient will improved sitting and standing posture with decreased shoulder elevation and positioned below ears to increase space for cervical structures to reduce symptoms.  Baseline: Kyphotic Posture  Goal status: INITIAL  3.  Patient will exhibit a reduction of numbness and tingling in right sided radicular numbness and tingling with a 50% reduction in occurrence throughout day.  Baseline: Experiences symptoms all day  Goal status: INITIAL  4. Patient will improve parascapular strength by >=1/2 grade MMT to maintain improved posture for decrease in symptoms.  Baseline: Mid Trap R/L 4+/4+ Lower Trap R/L 4-/4- Goal status: INITIAL   PLAN: PT FREQUENCY: 1-2x/week  PT DURATION:  16 sessions  PLANNED INTERVENTIONS: Therapeutic exercises, Therapeutic activity, Patient/Family education, Joint manipulation, Joint mobilization, Orthotic/Fit training, Dry Needling, Electrical stimulation, Spinal manipulation, Spinal mobilization, Cryotherapy, Moist heat, Manual therapy, and Re-evaluation  PLAN FOR NEXT SESSION: Wrist pronation and supination MMT  Bradly Chris PT, DPT  08/22/2021, 10:46 AM

## 2021-08-22 ENCOUNTER — Ambulatory Visit: Payer: Self-pay | Admitting: Physical Therapy

## 2021-08-22 NOTE — Addendum Note (Signed)
Addended by: Daneil Dan on: 08/22/2021 10:54 AM   Modules accepted: Orders

## 2021-08-25 NOTE — Patient Instructions (Signed)

## 2021-08-26 ENCOUNTER — Ambulatory Visit: Payer: 59 | Admitting: Physical Therapy

## 2021-08-27 ENCOUNTER — Encounter: Payer: Managed Care, Other (non HMO) | Admitting: Physical Therapy

## 2021-08-28 ENCOUNTER — Ambulatory Visit: Payer: 59 | Admitting: Physical Therapy

## 2021-08-28 ENCOUNTER — Encounter: Payer: Self-pay | Admitting: Physical Therapy

## 2021-08-28 DIAGNOSIS — R202 Paresthesia of skin: Secondary | ICD-10-CM

## 2021-08-28 DIAGNOSIS — M5412 Radiculopathy, cervical region: Secondary | ICD-10-CM | POA: Diagnosis not present

## 2021-08-28 NOTE — Therapy (Signed)
OUTPATIENT PHYSICAL THERAPY SHOULDER EVALUATION   Patient Name: Joe Gutierrez MRN: [id number] DOB:1975-05-18, 46 y.o., male Today's Date: 08/28/2021   PT End of Session - 08/28/21 1801     Visit Number 2    Number of Visits 16    Date for PT Re-Evaluation 10/17/21    Authorization Type Self Pay    PT Start Time 1800    PT Stop Time 1845    PT Time Calculation (min) 45 min    Activity Tolerance Patient tolerated treatment well    Behavior During Therapy WFL for tasks assessed/performed             Past Medical History:  Diagnosis Date   Anxiety    Arthritis    Colon polyp    Depression    Heart murmur    History of kidney stones    Hypertension    Obesity    Sleep apnea    Past Surgical History:  Procedure Laterality Date   APPENDECTOMY     CLOSED REDUCTION NASAL FRACTURE N/A 06/11/2021   Procedure: CLOSED REDUCTION NASAL FRACTURE;  Surgeon: Allena Napoleon, MD;  Location: [facility];  Service: Plastics;  Laterality: N/A;   COLONOSCOPY WITH PROPOFOL N/A 11/16/2020   Procedure: COLONOSCOPY WITH PROPOFOL;  Surgeon: Wyline Mood, MD;  Location: [facility]COPY;  Service: Gastroenterology;  Laterality: N/A;   LAPAROSCOPIC RIGHT COLECTOMY N/A 01/24/2021   Procedure: LAPAROSCOPIC RIGHT COLECTOMY with Laqueta Due, PA-C assisting;  Surgeon: Leafy Ro, MD;  Location: [facility] ORS;  Service: General;  Laterality: N/A;   LITHOTRIPSY     Patient Active Problem List   Diagnosis Date Noted   Coracoid impingement of right shoulder 07/10/2021   Low testosterone in male 07/07/2021   Type O blood, Rh positive 05/25/2021   S/P laparoscopic colectomy 01/24/2021   Erectile dysfunction 11/26/2020   Abnormal colonoscopy 11/26/2020   Elevated low density lipoprotein (LDL) cholesterol level 11/24/2020   Dupuytren's disease of palm of left hand 07/28/2020   IFG (impaired fasting glucose) 03/23/2018   Vitamin D deficiency 03/23/2018   Sleep apnea 01/13/2017   Depression with anxiety  11/10/2016   Obesity 11/10/2016   Essential hypertension, benign 08/27/2015   Benign prostatic hyperplasia without lower urinary tract symptoms 05/03/2012    PCP: Dr. Harvest Dark   REFERRING PROVIDER: Dr. Dahlia Bailiff DIAG: M75.41 (ICD-10-CM) - Coracoid impingement of right shoulder  THERAPY DIAG:  Radiculopathy, cervical region  Numbness and tingling of right thumb  Rationale for Evaluation and Treatment Rehabilitation  ONSET DATE: 05/22/21  SUBJECTIVE:  SUBJECTIVE STATEMENT: Pt reports that he is having a dramatic improvement in the n/t down his right arm and that he would like to start working out gym soon.   PERTINENT HISTORY: Per Dr. Carren Rang note on 07/10/21    Musculoskeletal and Integument   Coracoid impingement of right shoulder      Acute and ongoing with no improvement -- will place referral for physical therapy since Prednisone offered no benefit.  Discussed with patient and he is in agreeance with this plan.  If no improvement after PT then will send to ortho.    PAIN:  Are you having pain? No  PRECAUTIONS: None  WEIGHT BEARING RESTRICTIONS No  FALLS:  Has patient fallen in last 6 months? No  LIVING ENVIRONMENT: Lives with: lives with their family Lives in: House/apartment Stairs: Yes: External: 3 steps; can reach both Has following equipment at home: None  OCCUPATION: Maintenance and uses hands a lot   PLOF: Independent  PATIENT GOALS Wants numbness and tingling in right thumb to go away   OBJECTIVE:               VITALS: BP 144/97 HR 80 SpO2 99  DIAGNOSTIC FINDINGS:  None listed for this impairment   PATIENT SURVEYS:  FOTO 59/71   COGNITION:  Overall cognitive status: Within functional limits for tasks assessed     SENSATION: Light touch: Impaired  and  N/T in right thumb that extends upwards lateral side of forearm to right shoulder   POSTURE: Forward Flexed Rounded Shoulders   UPPER EXTREMITY ROM:   ROM: Cervical.                                                            AROM   PROM          NORM               flex:     50                                 50                     ext:     60                                 60           AROM R/L      PROM                          NORM     SB:         45 /45                                                     45  rot:          70/70             70/70  70-90        Active ROM Right eval Left eval  Shoulder flexion 180 180  Shoulder extension 60 60  Shoulder abduction 180  180  Shoulder adduction    Shoulder internal rotation 70 70  Shoulder external rotation 90 90  Elbow flexion 150 150  Elbow extension 60 60  Wrist flexion 80 80  Wrist extension 70 70  Wrist ulnar deviation 30 30  Wrist radial deviation 20 20  Wrist pronation    Wrist supination    (Blank rows = not tested)      MMT    Active MMT Right eval Left eval  Shoulder flexion 4+ 4+  Shoulder extension 5 5  Shoulder abduction 4+ 4+  Shoulder adduction    Shoulder internal rotation 4+ 4+  Shoulder external rotation 4+ 4+  Elbow flexion 5 5  Elbow extension 5 5  Wrist flexion 5 5  Wrist extension 5 5  Wrist ulnar deviation    Wrist radial deviation    Wrist pronation    Wrist supination    (Blank rows = not tested)                                               Grip Strength R/L Good Good                                               Mid Trap R/L 4+/4+ Lower Trap R/L 4-/4-             CERVICAL SPECIAL TESTS:             Spurling's: + R                       Upper Limb Tension: + R                       Cervical Traction: +  SHOULDER SPECIAL TESTS:  Impingement tests: Painful arc test: negative  SLAP lesions:  Not performed   Instability tests:  Not performed    Rotator cuff assessment: Drop arm test: negative, Full can test: negative, and Infraspinatus test: negative  Biceps assessment:  Negative   JOINT MOBILITY TESTING:  Not tested   PALPATION:  Right lateral forearm and right thumb    TODAY'S TREATMENT:       08/28/21:            UBE level 4 resistance for 5 min             Cervical retraction 3 x 10 with 3 sec hold            Horizontal Abduction with Yellow TB 1 x 10            Horizontal Abduction with Red TB 2 x 10              PNF D2 Flexion with Red TB 2 x 10            OMEGA Lat Pull Down #55 3 x 10             -min VC to decrease speed of eccentric portion of exercise  OMEGA Seated Rows #55 3 x 10                                      Initial:  Radial Nerve Glides 3 x 10  PNF D2 Flexion with Yellow TB 3 x 10    PATIENT EDUCATION: Education details: form and technique for appropriate exercise and explanation about cervical radiculopathy  Person educated: Patient Education method: Explanation, Demonstration, Verbal cues, and Handouts Education comprehension: verbalized understanding, returned demonstration, verbal cues required, and tactile cues required   HOME EXERCISE PROGRAM: Access Code: [email or web address] URL: [email or web address] Date: 08/28/2021 Prepared by: Ellin Goodie  Exercises - Standing Radial Nerve Glide  - 1 x daily - 7 x weekly - 3 sets - 10 reps - Standing Shoulder Single Arm PNF D2 Flexion with Resistance  - 1 x daily - 3 x weekly - 3 sets - 10 reps - Seated Cervical Retraction  - 1 x daily - 7 x weekly - 3 sets - 10 reps - Standing Shoulder Horizontal Abduction with Resistance  - 1 x daily - 3 x weekly - 3 sets - 10 reps - Lat Pull Down  - 1 x daily - 3 x weekly - 3 sets - 10 reps - Seated Row Cable Machine  - 1 x daily - 3 x weekly - 3 sets - 10 reps  ASSESSMENT:  CLINICAL IMPRESSION:  Pt presents to session with a significant improvement in his symptoms with a decrease in  his numbness in tingling in right hand throughout entire day. He was able to perform all exercises without an increase in his symptoms demonstrating increased parascapular strength with ability to perform D2 PNF with increased resistance. Portion of session spent reviewing UE exercise machines to improve parascapular strength, because pt will starting gym membership. He will continue to benefit from skilled PT to decrease n/t to carry out reaching a gripping tasks without experiencing symptoms.     OBJECTIVE IMPAIRMENTS decreased strength, impaired sensation, and postural dysfunction.   ACTIVITY LIMITATIONS carrying and lifting  PARTICIPATION LIMITATIONS: occupation  PERSONAL FACTORS Profession and 1 comorbidity: anxiety   are also affecting patient's functional outcome.   REHAB POTENTIAL: Good  CLINICAL DECISION MAKING: Stable/uncomplicated  EVALUATION COMPLEXITY: Low   GOALS: Goals reviewed with patient? No  SHORT TERM GOALS: Target date: 09/11/2021    Pt will be independent with HEP in order to improve strength and balance in order to decrease fall risk and improve function at home and work. Baseline: NT  Goal status: INITIAL   LONG TERM GOALS: Target date: 10/23/2021    Patient will have improved function and activity level as evidenced by an increase in FOTO score by 10 points or more.  Baseline: 59 Goal status: ONGOING   2.  Patient will improved sitting and standing posture with decreased shoulder elevation and positioned below ears to increase space for cervical structures to reduce symptoms.  Baseline: Kyphotic Posture  Goal status: ONGOING   3.  Patient will exhibit a reduction of numbness and tingling in right sided radicular numbness and tingling with a 50% reduction in occurrence throughout day.  Baseline: Experiences symptoms all day  Goal status: ONGOING  4. Patient will improve parascapular strength by >=1/2 grade MMT to maintain improved posture for decrease in  symptoms.  Baseline: Mid Trap R/L 4+/4+ Lower Trap R/L 4-/4- Goal status: ONGOING    PLAN: PT FREQUENCY:  1-2x/week  PT DURATION:  16 sessions  PLANNED INTERVENTIONS: Therapeutic exercises, Therapeutic activity, Patient/Family education, Joint manipulation, Joint mobilization, Orthotic/Fit training, Dry Needling, Electrical stimulation, Spinal manipulation, Spinal mobilization, Cryotherapy, Moist heat, Manual therapy, and Re-evaluation  PLAN FOR NEXT SESSION: Face Pulls, RDLS. Wrist pronation and supination MMT. Progress HEP   Ellin Goodie PT, DPT  08/28/2021, 6:03 PM

## 2021-08-29 ENCOUNTER — Encounter: Payer: Self-pay | Admitting: Nurse Practitioner

## 2021-08-29 ENCOUNTER — Encounter: Payer: Self-pay | Admitting: Physical Therapy

## 2021-08-29 ENCOUNTER — Encounter: Payer: Managed Care, Other (non HMO) | Admitting: Physical Therapy

## 2021-08-29 ENCOUNTER — Ambulatory Visit: Payer: 59 | Admitting: Nurse Practitioner

## 2021-08-29 DIAGNOSIS — M25511 Pain in right shoulder: Secondary | ICD-10-CM | POA: Insufficient documentation

## 2021-08-29 NOTE — Assessment & Plan Note (Signed)
Acute and improving with physical therapy.  Continue collaboration with PT and ortho.

## 2021-08-29 NOTE — Progress Notes (Signed)
BP 115/78   Pulse 61   Temp 98 F (36.7 C)   Wt 259 lb (117.5 kg)   SpO2 97%   BMI 37.16 kg/m    Subjective:    Patient ID: Joe Gutierrez, male    DOB: 06/09/1975, 47 y.o.   MRN: [id number]  HPI: Joe Gutierrez is a 46 y.o. male  Chief Complaint  Patient presents with   Numbness    Patient states his right shoulder numbness is doing better, has started PT    SHOULDER PAIN Follow-up for shoulder pain right side, saw 07/10/21.  Started therapy with [facility] last Monday and is starting to notice improvement.  Less numbness in the thumb.   Duration: months Involved shoulder: right Mechanism of injury: unknown Status: better Treatments attempted: heat, APAP, ibuprofen, and physical therapy  Relief with NSAIDs?:  mild Weakness: no Numbness: no Decreased grip strength: no Redness: no Swelling: no Bruising: no Fevers: no   Relevant past medical, surgical, family and social history reviewed and updated as indicated. Interim medical history since our last visit reviewed. Allergies and medications reviewed and updated.  Review of Systems  Constitutional:  Negative for activity change, diaphoresis, fatigue and fever.  Respiratory:  Negative for cough, chest tightness, shortness of breath and wheezing.   Cardiovascular:  Negative for chest pain, palpitations and leg swelling.  Gastrointestinal: Negative.   Endocrine: Negative.   Neurological: Negative.   Psychiatric/Behavioral: Negative.      Per HPI unless specifically indicated above     Objective:    BP 115/78   Pulse 61   Temp 98 F (36.7 C)   Wt 259 lb (117.5 kg)   SpO2 97%   BMI 37.16 kg/m   Wt Readings from Last 3 Encounters:  08/29/21 259 lb (117.5 kg)  07/10/21 257 lb 9.6 oz (116.8 kg)  07/05/21 250 lb (113.4 kg)    Physical Exam Vitals and nursing note reviewed.  Constitutional:      General: He is awake. He is not in acute distress.    Appearance: He is well-developed, well-groomed and  overweight. He is not ill-appearing.  HENT:     Head: Normocephalic and atraumatic.     Right Ear: Hearing normal. No drainage.     Left Ear: Hearing normal. No drainage.  Eyes:     General: Lids are normal.        Right eye: No discharge.        Left eye: No discharge.     Conjunctiva/sclera: Conjunctivae normal.     Pupils: Pupils are equal, round, and reactive to light.  Neck:     Vascular: No carotid bruit.  Cardiovascular:     Rate and Rhythm: Normal rate and regular rhythm.     Heart sounds: Normal heart sounds, S1 normal and S2 normal. No murmur heard.    No gallop.  Pulmonary:     Effort: Pulmonary effort is normal. No accessory muscle usage or respiratory distress.     Breath sounds: Normal breath sounds.  Abdominal:     General: Bowel sounds are normal.     Palpations: Abdomen is soft.  Musculoskeletal:     Right shoulder: Crepitus present. No swelling or tenderness. Normal range of motion. Normal strength.     Left shoulder: Normal.     Cervical back: Normal range of motion and neck supple.     Right knee: Normal.     Left knee: Normal.     Right  lower leg: No edema.     Left lower leg: No edema.  Skin:    General: Skin is warm and dry.  Neurological:     Mental Status: He is alert and oriented to person, place, and time.     Cranial Nerves: Cranial nerves 2-12 are intact.     Motor: Motor function is intact.     Coordination: Coordination is intact.     Gait: Gait is intact.     Deep Tendon Reflexes: Reflexes are normal and symmetric.  Psychiatric:        Attention and Perception: Attention normal.        Mood and Affect: Mood normal.        Speech: Speech normal.        Behavior: Behavior normal. Behavior is cooperative.    Results for orders placed or performed during the hospital encounter of 07/05/21  FSH/LH  Result Value Ref Range   LH 3.0 1.7 - 8.6 mIU/mL   FSH 3.0 1.5 - 12.4 mIU/mL  Prolactin  Result Value Ref Range   Prolactin 6.4 4.0 - 15.2  ng/mL      Assessment & Plan:   Problem List Items Addressed This Visit       Other   Right shoulder pain    Acute and improving with physical therapy.  Continue collaboration with PT and ortho.          Follow up plan: Return in about 3 months (around 11/27/2021) for Annual physical after 11/26/21.

## 2021-09-02 ENCOUNTER — Ambulatory Visit: Payer: 59 | Admitting: Physical Therapy

## 2021-09-02 ENCOUNTER — Encounter: Payer: Self-pay | Admitting: Physical Therapy

## 2021-09-02 DIAGNOSIS — M5412 Radiculopathy, cervical region: Secondary | ICD-10-CM | POA: Diagnosis not present

## 2021-09-02 DIAGNOSIS — R200 Anesthesia of skin: Secondary | ICD-10-CM

## 2021-09-02 NOTE — Therapy (Unsigned)
OUTPATIENT PHYSICAL THERAPY SHOULDER TREATMENT    Patient Name: Joe Gutierrez MRN: [id number] DOB:May 21, 1975, 46 y.o., male Today's Date: 09/02/2021   PT End of Session - 09/02/21 1813     Visit Number 3    Number of Visits 16    Date for PT Re-Evaluation 10/17/21    Authorization Type Self Pay    PT Start Time 1800    PT Stop Time 1845    PT Time Calculation (min) 45 min    Activity Tolerance Patient tolerated treatment well    Behavior During Therapy WFL for tasks assessed/performed             Past Medical History:  Diagnosis Date   Anxiety    Arthritis    Colon polyp    Depression    Heart murmur    History of kidney stones    Hypertension    Obesity    Sleep apnea    Past Surgical History:  Procedure Laterality Date   APPENDECTOMY     CLOSED REDUCTION NASAL FRACTURE N/A 06/11/2021   Procedure: CLOSED REDUCTION NASAL FRACTURE;  Surgeon: [name], MD;  Location: [facility];  Service: Plastics;  Laterality: N/A;   COLONOSCOPY WITH PROPOFOL N/A 11/16/2020   Procedure: COLONOSCOPY WITH PROPOFOL;  Surgeon: [name], MD;  Location: [facility] ENDOSCOPY;  Service: Gastroenterology;  Laterality: N/A;   LAPAROSCOPIC RIGHT COLECTOMY N/A 01/24/2021   Procedure: LAPAROSCOPIC RIGHT COLECTOMY with Otho Ket, PA-C assisting;  Surgeon: Jules Husbands, MD;  Location: [facility];  Service: General;  Laterality: N/A;   LITHOTRIPSY     Patient Active Problem List   Diagnosis Date Noted   Right shoulder pain 08/29/2021   Coracoid impingement of right shoulder 07/10/2021   Low testosterone in male 07/07/2021   Type O blood, Rh positive 05/25/2021   S/P laparoscopic colectomy 01/24/2021   Erectile dysfunction 11/26/2020   Abnormal colonoscopy 11/26/2020   Elevated low density lipoprotein (LDL) cholesterol level 11/24/2020   Dupuytren's disease of palm of left hand 07/28/2020   IFG (impaired fasting glucose) 03/23/2018   Vitamin D deficiency 03/23/2018   Sleep apnea 01/13/2017    Depression with anxiety 11/10/2016   Obesity 11/10/2016   Essential hypertension, benign 08/27/2015   Benign prostatic hyperplasia without lower urinary tract symptoms 05/03/2012    PCP: Dr. Ned Card   REFERRING PROVIDER: Dr. Maryln Gottron DIAG: M75.41 (ICD-10-CM) - Coracoid impingement of right shoulder  THERAPY DIAG:  Radiculopathy, cervical region  Numbness and tingling of right thumb  Rationale for Evaluation and Treatment Rehabilitation  ONSET DATE: 05/22/21  SUBJECTIVE:  SUBJECTIVE STATEMENT: Pt reports continued improvement in n/t in arm and he recently joined gym where he is completing exercises that he learns in clinic.    PERTINENT HISTORY: Per Dr. Suezanne Jacquet note on 07/10/21    Musculoskeletal and Integument   Coracoid impingement of right shoulder      Acute and ongoing with no improvement -- will place referral for physical therapy since Prednisone offered no benefit.  Discussed with patient and he is in agreeance with this plan.  If no improvement after PT then will send to ortho.    PAIN:  Are you having pain? No  PRECAUTIONS: None  WEIGHT BEARING RESTRICTIONS No  FALLS:  Has patient fallen in last 6 months? No  LIVING ENVIRONMENT: Lives with: lives with their family Lives in: House/apartment Stairs: Yes: External: 3 steps; can reach both Has following equipment at home: None  OCCUPATION: Maintenance and uses hands a lot   PLOF: Independent  PATIENT GOALS Wants numbness and tingling in right thumb to go away   OBJECTIVE:               VITALS: BP 144/97 HR 80 SpO2 99  DIAGNOSTIC FINDINGS:  None listed for this impairment   PATIENT SURVEYS:  FOTO 59/71   COGNITION:  Overall cognitive status: Within functional limits for tasks  assessed     SENSATION: Light touch: Impaired  and N/T in right thumb that extends upwards lateral side of forearm to right shoulder   POSTURE: Forward Flexed Rounded Shoulders   UPPER EXTREMITY ROM:   ROM: Cervical.                                                            AROM   PROM          NORM               flex:     50                                 50                     ext:     60                                 60           AROM R/L      PROM                          NORM     SB:         45 /45                                                     45  rot:          70/70             70/70  70-90        Active ROM Right eval Left eval  Shoulder flexion 180 180  Shoulder extension 60 60  Shoulder abduction 180  180  Shoulder adduction    Shoulder internal rotation 70 70  Shoulder external rotation 90 90  Elbow flexion 150 150  Elbow extension 60 60  Wrist flexion 80 80  Wrist extension 70 70  Wrist ulnar deviation 30 30  Wrist radial deviation 20 20  Wrist pronation    Wrist supination    (Blank rows = not tested)      MMT    Active MMT Right eval Left eval  Shoulder flexion 4+ 4+  Shoulder extension 5 5  Shoulder abduction 4+ 4+  Shoulder adduction    Shoulder internal rotation 4+ 4+  Shoulder external rotation 4+ 4+  Elbow flexion 5 5  Elbow extension 5 5  Wrist flexion 5 5  Wrist extension 5 5  Wrist ulnar deviation    Wrist radial deviation    Wrist pronation    Wrist supination    (Blank rows = not tested)                                               Grip Strength R/L Good Good                                               Mid Trap R/L 4+/4+ Lower Trap R/L 4-/4-             CERVICAL SPECIAL TESTS:             Spurling's: + R                       Upper Limb Tension: + R                       Cervical Traction: +  SHOULDER SPECIAL TESTS:  Impingement tests: Painful arc test: negative  SLAP lesions:   Not performed   Instability tests:  Not performed   Rotator cuff assessment: Drop arm test: negative, Full can test: negative, and Infraspinatus test: negative  Biceps assessment:  Negative   JOINT MOBILITY TESTING:  Not tested   PALPATION:  Right lateral forearm and right thumb    TODAY'S TREATMENT:       09/02/21:           UBE level 4 resistance for 5 min            Face Pulls with #15 3 x 10            RDLS with #81 3 x 10            Quadruped Horizontal Abduction with #5 DB 3 x 10     08/28/21:            UBE level 4 resistance for 5 min             Cervical retraction 3 x 10 with 3 sec hold            Horizontal Abduction with Yellow TB 1 x 10  Horizontal Abduction with Red TB 2 x 10              PNF D2 Flexion with Red TB 2 x 10            OMEGA Lat Pull Down #55 3 x 10             -min VC to decrease speed of eccentric portion of exercise             OMEGA Seated Rows #55 3 x 10                                      Initial:  Radial Nerve Glides 3 x 10  PNF D2 Flexion with Yellow TB 3 x 10    PATIENT EDUCATION: Education details: form and technique for appropriate exercise and explanation about cervical radiculopathy  Person educated: Patient Education method: Explanation, Demonstration, Verbal cues, and Handouts Education comprehension: verbalized understanding, returned demonstration, verbal cues required, and tactile cues required   HOME EXERCISE PROGRAM: Access Code: [email or web address] URL: [email or web address] Date: 09/02/2021 Prepared by: Bradly Chris  Exercises - Standing Radial Nerve Glide  - 1 x daily - 7 x weekly - 3 sets - 10 reps - Standing Shoulder Single Arm PNF D2 Flexion with Resistance  - 1 x daily - 3 x weekly - 3 sets - 10 reps - Seated Cervical Retraction  - 1 x daily - 7 x weekly - 3 sets - 10 reps - Lat Pull Down  - 1 x daily - 3 x weekly - 3 sets - 10 reps - Seated Row Cable Machine  - 1 x daily - 3 x weekly - 3 sets -  10 reps - Single Arm Bent Over Shoulder Horizontal Abduction with Dumbbell - Palm Down  - 1 x daily - 3 x weekly - 3 sets - 10 reps - Kettlebell Deadlift  - 1 x daily - 3 x weekly - 3 sets - 10 reps  ASSESSMENT:  CLINICAL IMPRESSION:  Pt continues to exhibit resolution of symptoms with no n/t down RUE. He was able to complete all exercises without an exacerbation of his symptoms even with progression of parascapular exercises. He is nearing end of POC and next session PT will complete reassessment of goals if pt continues to have no symptoms.    OBJECTIVE IMPAIRMENTS decreased strength, impaired sensation, and postural dysfunction.   ACTIVITY LIMITATIONS carrying and lifting  PARTICIPATION LIMITATIONS: occupation  PERSONAL FACTORS Profession and 1 comorbidity: anxiety   are also affecting patient's functional outcome.   REHAB POTENTIAL: Good  CLINICAL DECISION MAKING: Stable/uncomplicated  EVALUATION COMPLEXITY: Low   GOALS: Goals reviewed with patient? No  SHORT TERM GOALS: Target date: 09/16/2021    Pt will be independent with HEP in order to improve strength and balance in order to decrease fall risk and improve function at home and work. Baseline: NT  Goal status: INITIAL   LONG TERM GOALS: Target date: 10/28/2021    Patient will have improved function and activity level as evidenced by an increase in FOTO score by 10 points or more.  Baseline: 59 Goal status: ONGOING   2.  Patient will improved sitting and standing posture with decreased shoulder elevation and positioned below ears to increase space for cervical structures to reduce symptoms.  Baseline: Kyphotic Posture  Goal status: ONGOING   3.  Patient will exhibit a  reduction of numbness and tingling in right sided radicular numbness and tingling with a 50% reduction in occurrence throughout day.  Baseline: Experiences symptoms all day  Goal status: ONGOING  4. Patient will improve parascapular strength by  >=1/2 grade MMT to maintain improved posture for decrease in symptoms.  Baseline: Mid Trap R/L 4+/4+ Lower Trap R/L 4-/4- Goal status: ONGOING    PLAN: PT FREQUENCY: 1-2x/week  PT DURATION:  16 sessions  PLANNED INTERVENTIONS: Therapeutic exercises, Therapeutic activity, Patient/Family education, Joint manipulation, Joint mobilization, Orthotic/Fit training, Dry Needling, Electrical stimulation, Spinal manipulation, Spinal mobilization, Cryotherapy, Moist heat, Manual therapy, and Re-evaluation  PLAN FOR NEXT SESSION:  Reassess goals. Wrist pronation and supination MMT. Progress HEP   Bradly Chris PT, DPT  09/02/2021, 6:13 PM

## 2021-09-03 ENCOUNTER — Encounter: Payer: Managed Care, Other (non HMO) | Admitting: Physical Therapy

## 2021-09-04 ENCOUNTER — Ambulatory Visit: Payer: 59 | Admitting: Physical Therapy

## 2021-09-05 ENCOUNTER — Encounter: Payer: Managed Care, Other (non HMO) | Admitting: Physical Therapy

## 2021-09-09 ENCOUNTER — Encounter: Payer: Self-pay | Admitting: Physical Therapy

## 2021-09-09 ENCOUNTER — Ambulatory Visit: Payer: 59 | Admitting: Physical Therapy

## 2021-09-09 DIAGNOSIS — R202 Paresthesia of skin: Secondary | ICD-10-CM

## 2021-09-09 DIAGNOSIS — M5412 Radiculopathy, cervical region: Secondary | ICD-10-CM | POA: Diagnosis not present

## 2021-09-09 NOTE — Therapy (Unsigned)
OUTPATIENT PHYSICAL THERAPY SHOULDER TREATMENT    Patient Name: Joe Gutierrez MRN: [id number] DOB:Mar 20, 1975, 46 y.o., male Today's Date: 09/09/2021   PT End of Session - 09/09/21 1751     Visit Number 4    Number of Visits 16    Date for PT Re-Evaluation 10/17/21    Authorization Type Self Pay    PT Start Time 1750    PT Stop Time 1820    PT Time Calculation (min) 30 min    Activity Tolerance Patient tolerated treatment well    Behavior During Therapy WFL for tasks assessed/performed             Past Medical History:  Diagnosis Date   Anxiety    Arthritis    Colon polyp    Depression    Heart murmur    History of kidney stones    Hypertension    Obesity    Sleep apnea    Past Surgical History:  Procedure Laterality Date   APPENDECTOMY     CLOSED REDUCTION NASAL FRACTURE N/A 06/11/2021   Procedure: CLOSED REDUCTION NASAL FRACTURE;  Surgeon: [name], MD;  Location: [place];  Service: Plastics;  Laterality: N/A;   COLONOSCOPY WITH PROPOFOL N/A 11/16/2020   Procedure: COLONOSCOPY WITH PROPOFOL;  Surgeon: [name], MD;  Location: [facility] ENDOSCOPY;  Service: Gastroenterology;  Laterality: N/A;   LAPAROSCOPIC RIGHT COLECTOMY N/A 01/24/2021   Procedure: LAPAROSCOPIC RIGHT COLECTOMY with Otho Ket, PA-C assisting;  Surgeon: Jules Husbands, MD;  Location: [facility];  Service: General;  Laterality: N/A;   LITHOTRIPSY     Patient Active Problem List   Diagnosis Date Noted   Right shoulder pain 08/29/2021   Coracoid impingement of right shoulder 07/10/2021   Low testosterone in male 07/07/2021   Type O blood, Rh positive 05/25/2021   S/P laparoscopic colectomy 01/24/2021   Erectile dysfunction 11/26/2020   Abnormal colonoscopy 11/26/2020   Elevated low density lipoprotein (LDL) cholesterol level 11/24/2020   Dupuytren's disease of palm of left hand 07/28/2020   IFG (impaired fasting glucose) 03/23/2018   Vitamin D deficiency 03/23/2018   Sleep apnea 01/13/2017    Depression with anxiety 11/10/2016   Obesity 11/10/2016   Essential hypertension, benign 08/27/2015   Benign prostatic hyperplasia without lower urinary tract symptoms 05/03/2012    PCP: Dr. Ned Card   REFERRING PROVIDER: Dr. Maryln Gottron DIAG: M75.41 (ICD-10-CM) - Coracoid impingement of right shoulder  THERAPY DIAG:  Radiculopathy, cervical region  Numbness and tingling of right thumb  Rationale for Evaluation and Treatment Rehabilitation  ONSET DATE: 05/22/21  SUBJECTIVE:  SUBJECTIVE STATEMENT: Pt reports continued improvement in n/t in arm and he recently joined gym where he is completing exercises that he learns in clinic.    PERTINENT HISTORY: Per Dr. Suezanne Jacquet note on 07/10/21    Musculoskeletal and Integument   Coracoid impingement of right shoulder      Acute and ongoing with no improvement -- will place referral for physical therapy since Prednisone offered no benefit.  Discussed with patient and he is in agreeance with this plan.  If no improvement after PT then will send to ortho.    PAIN:  Are you having pain? No  PRECAUTIONS: None  WEIGHT BEARING RESTRICTIONS No  FALLS:  Has patient fallen in last 6 months? No  LIVING ENVIRONMENT: Lives with: lives with their family Lives in: House/apartment Stairs: Yes: External: 3 steps; can reach both Has following equipment at home: None  OCCUPATION: Maintenance and uses hands a lot   PLOF: Independent  PATIENT GOALS Wants numbness and tingling in right thumb to go away   OBJECTIVE:               VITALS: BP 144/97 HR 80 SpO2 99  DIAGNOSTIC FINDINGS:  None listed for this impairment   PATIENT SURVEYS:  FOTO 59/71   COGNITION:  Overall cognitive status: Within functional limits for tasks  assessed     SENSATION: Light touch: Impaired  and N/T in right thumb that extends upwards lateral side of forearm to right shoulder   POSTURE: Forward Flexed Rounded Shoulders   UPPER EXTREMITY ROM:   ROM: Cervical.                                                            AROM   PROM          NORM               flex:     50                                 50                     ext:     60                                 60           AROM R/L      PROM                          NORM     SB:         45 /45                                                     45  rot:          70/70             70/70  70-90        Active ROM Right eval Left eval  Shoulder flexion 180 180  Shoulder extension 60 60  Shoulder abduction 180  180  Shoulder adduction    Shoulder internal rotation 70 70  Shoulder external rotation 90 90  Elbow flexion 150 150  Elbow extension 60 60  Wrist flexion 80 80  Wrist extension 70 70  Wrist ulnar deviation 30 30  Wrist radial deviation 20 20  Wrist pronation    Wrist supination    (Blank rows = not tested)      MMT    Active MMT Right eval Left eval  Shoulder flexion 4+ 4+  Shoulder extension 5 5  Shoulder abduction 4+ 4+  Shoulder adduction    Shoulder internal rotation 4+ 4+  Shoulder external rotation 4+ 4+  Elbow flexion 5 5  Elbow extension 5 5  Wrist flexion 5 5  Wrist extension 5 5  Wrist ulnar deviation    Wrist radial deviation    Wrist pronation    Wrist supination    (Blank rows = not tested)                                               Grip Strength R/L Good Good                                               Mid Trap R/L 4+/4+ Lower Trap R/L 4-/4-             CERVICAL SPECIAL TESTS:             Spurling's: + R                       Upper Limb Tension: + R                       Cervical Traction: +  SHOULDER SPECIAL TESTS:  Impingement tests: Painful arc test: negative  SLAP lesions:   Not performed   Instability tests:  Not performed   Rotator cuff assessment: Drop arm test: negative, Full can test: negative, and Infraspinatus test: negative  Biceps assessment:  Negative   JOINT MOBILITY TESTING:  Not tested   PALPATION:  Right lateral forearm and right thumb    TODAY'S TREATMENT:   09/09/21:         UBE level 4 resistance for 5 min         Parascapular MMT:         -Mid Trap R/L 5/5        -Lower Trap R/L 4+/4+        Supine Shoulder Extension #15 1 x 10    Quadruped Y's with #5 DB 2 x 10     Bench Press       -min VC about position where DB should land on chest below nipple line and to keep shoulder from elevating to avoid irritation.    Bicep Curls alterning starting in supination #15 1 x 10        09/02/21:           UBE level 4 resistance for 5 min  Face Pulls with #15 3 x 10            RDLS with #81 3 x 10            Quadruped Horizontal Abduction with #5 DB 3 x 10     08/28/21:            UBE level 4 resistance for 5 min             Cervical retraction 3 x 10 with 3 sec hold            Horizontal Abduction with Yellow TB 1 x 10            Horizontal Abduction with Red TB 2 x 10              PNF D2 Flexion with Red TB 2 x 10            OMEGA Lat Pull Down #55 3 x 10             -min VC to decrease speed of eccentric portion of exercise             OMEGA Seated Rows #55 3 x 10                                      Initial:  Radial Nerve Glides 3 x 10  PNF D2 Flexion with Yellow TB 3 x 10    PATIENT EDUCATION: Education details: form and technique for appropriate exercise and explanation about cervical radiculopathy  Person educated: Patient Education method: Explanation, Demonstration, Verbal cues, and Handouts Education comprehension: verbalized understanding, returned demonstration, verbal cues required, and tactile cues required   HOME EXERCISE PROGRAM: Access Code: [email or web address] URL: [email or web address] Date:  09/02/2021 Prepared by: Bradly Chris  Exercises - Standing Radial Nerve Glide  - 1 x daily - 7 x weekly - 3 sets - 10 reps - Standing Shoulder Single Arm PNF D2 Flexion with Resistance  - 1 x daily - 3 x weekly - 3 sets - 10 reps - Seated Cervical Retraction  - 1 x daily - 7 x weekly - 3 sets - 10 reps - Lat Pull Down  - 1 x daily - 3 x weekly - 3 sets - 10 reps - Seated Row Cable Machine  - 1 x daily - 3 x weekly - 3 sets - 10 reps - Single Arm Bent Over Shoulder Horizontal Abduction with Dumbbell - Palm Down  - 1 x daily - 3 x weekly - 3 sets - 10 reps - Kettlebell Deadlift  - 1 x daily - 3 x weekly - 3 sets - 10 reps  ASSESSMENT:  CLINICAL IMPRESSION: Pt has met all of his PT goals and he is now ready for discharge from PT with a home exercise plan. He shows improved shoulder strength and decreased symptoms even with activity. Will complete HEP independently.    OBJECTIVE IMPAIRMENTS decreased strength, impaired sensation, and postural dysfunction.   ACTIVITY LIMITATIONS carrying and lifting  PARTICIPATION LIMITATIONS: occupation  PERSONAL FACTORS Profession and 1 comorbidity: anxiety   are also affecting patient's functional outcome.   REHAB POTENTIAL: Good  CLINICAL DECISION MAKING: Stable/uncomplicated  EVALUATION COMPLEXITY: Low   GOALS: Goals reviewed with patient? No  SHORT TERM GOALS: Target date: 09/23/2021    Pt will be independent with HEP in order to improve strength and balance  in order to decrease fall risk and improve function at home and work. Baseline: NT  Goal status: INITIAL   LONG TERM GOALS: Target date: 11/04/2021    Patient will have improved function and activity level as evidenced by an increase in FOTO score by 10 points or more.  Baseline: 59  09/09/21: 76 Goal status: ACHIEVED   2.  Patient will improved sitting and standing posture with decreased shoulder elevation and positioned below ears to increase space for cervical structures to  reduce symptoms.  Baseline: Kyphotic Posture 09/09/21: Still same posture  Goal status: ACHIEVED   3.  Patient will exhibit a reduction of numbness and tingling in right sided radicular numbness and tingling with a 50% reduction in occurrence throughout day.  Baseline: Experiences symptoms all day 09/09/21: No longer experiencing symptoms  Goal status: ACHIEVED   4. Patient will improve parascapular strength by >=1/2 grade MMT to maintain improved posture for decrease in symptoms.  Baseline: Mid Trap R/L 4+/4+ Lower Trap R/L 4-/4- 09/09/21: Mid Trap R/L 5/5 Lower Trap 4+/4+ Goal status: ACHIEVED    PLAN: PT FREQUENCY: 1-2x/week  PT DURATION:  16 sessions  PLANNED INTERVENTIONS: Therapeutic exercises, Therapeutic activity, Patient/Family education, Joint manipulation, Joint mobilization, Orthotic/Fit training, Dry Needling, Electrical stimulation, Spinal manipulation, Spinal mobilization, Cryotherapy, Moist heat, Manual therapy, and Re-evaluation  PLAN FOR NEXT SESSION:  Discharge from [place] PT, DPT  09/09/2021, 5:53 PM                           Patient will benefit from skilled therapeutic intervention in order to improve the following deficits and impairments:     Visit Diagnosis: Radiculopathy, cervical region  Numbness and tingling of right thumb     Bradly Chris PT, DPT  09/09/2021, 5:52 PM  [facility] [address], [place], [address] Phone: [phone]   Fax:  [phone]  Name: Joe Gutierrez MRN: [id number] Date of Birth: 1975/04/17

## 2021-09-10 ENCOUNTER — Encounter: Payer: Managed Care, Other (non HMO) | Admitting: Physical Therapy

## 2021-09-11 ENCOUNTER — Ambulatory Visit: Payer: 59 | Admitting: Physical Therapy

## 2021-09-12 ENCOUNTER — Encounter: Payer: Managed Care, Other (non HMO) | Admitting: Physical Therapy

## 2021-09-14 ENCOUNTER — Other Ambulatory Visit: Payer: Self-pay | Admitting: Nurse Practitioner

## 2021-09-16 ENCOUNTER — Ambulatory Visit: Payer: 59 | Admitting: Physical Therapy

## 2021-09-16 ENCOUNTER — Encounter: Payer: Managed Care, Other (non HMO) | Admitting: Physical Therapy

## 2021-09-16 NOTE — Telephone Encounter (Signed)
Requested Prescriptions  Pending Prescriptions Disp Refills  . sertraline (ZOLOFT) 100 MG tablet [Pharmacy Med Name: SERTRALINE HCL 100 MG TABLET] 180 tablet 1    Sig: TAKE 2 TABLETS BY MOUTH EVERY DAY     Psychiatry:  Antidepressants - SSRI - sertraline Passed - 09/14/2021  3:16 PM      Passed - AST in normal range and within 360 days    AST  Date Value Ref Range Status  11/26/2020 16 0 - 40 IU/L Final   SGOT(AST)  Date Value Ref Range Status  08/09/2012 24 15 - 37 Unit/L Final         Passed - ALT in normal range and within 360 days    ALT  Date Value Ref Range Status  11/26/2020 14 0 - 44 IU/L Final   SGPT (ALT)  Date Value Ref Range Status  08/09/2012 22 12 - 78 U/L Final         Passed - Completed PHQ-2 or PHQ-9 in the last 360 days      Passed - Valid encounter within last 6 months    Recent Outpatient Visits          2 weeks ago Acute pain of right shoulder   [place], [place] T, NP   2 months ago Depression with anxiety   [facility] [place], [place] T, NP   3 months ago Essential hypertension, benign   [facility] [place], [place] T, NP   9 months ago Depression with anxiety   [place], Jolene T, NP   10 months ago Acute pain of right wrist   [place], Barbaraann Faster, NP      Future Appointments            In 3 weeks [place], Hunt Oris, PA-C [place]   In 2 months [place], Barbaraann Faster, NP [employer], [facility]

## 2021-09-18 ENCOUNTER — Encounter: Payer: Self-pay | Admitting: Physical Therapy

## 2021-09-19 ENCOUNTER — Encounter: Payer: Managed Care, Other (non HMO) | Admitting: Physical Therapy

## 2021-10-07 ENCOUNTER — Ambulatory Visit: Payer: 59 | Admitting: Urology

## 2021-10-07 ENCOUNTER — Encounter: Payer: Self-pay | Admitting: Urology

## 2021-10-07 ENCOUNTER — Other Ambulatory Visit
Admission: RE | Admit: 2021-10-07 | Discharge: 2021-10-07 | Disposition: A | Discharge status: Home / Self Care | Payer: 59 | Attending: Urology | Admitting: Urology

## 2021-10-07 VITALS — BP 131/81 | HR 89 | Ht 70.0 in | Wt 256.0 lb

## 2021-10-07 DIAGNOSIS — N529 Male erectile dysfunction, unspecified: Secondary | ICD-10-CM

## 2021-10-07 DIAGNOSIS — E349 Endocrine disorder, unspecified: Secondary | ICD-10-CM

## 2021-10-07 DIAGNOSIS — R7989 Other specified abnormal findings of blood chemistry: Secondary | ICD-10-CM | POA: Diagnosis present

## 2021-10-07 DIAGNOSIS — E291 Testicular hypofunction: Secondary | ICD-10-CM | POA: Diagnosis not present

## 2021-10-07 LAB — HEPATIC FUNCTION PANEL
ALT: 17 U/L (ref 0–44)
AST: 21 U/L (ref 15–41)
Albumin: 4.2 g/dL (ref 3.5–5.0)
Alkaline Phosphatase: 53 U/L (ref 38–126)
Bilirubin, Direct: <0.1 mg/dL (ref 0.0–0.2)
Total Bilirubin: 0.5 mg/dL (ref 0.3–1.2)
Total Protein: 6.9 g/dL (ref 6.5–8.1)

## 2021-10-07 LAB — PSA: Prostatic Specific Antigen: 1.1 ng/mL (ref 0.00–4.00)

## 2021-10-07 NOTE — Progress Notes (Signed)
10/07/21 3:42 PM   Joe Gutierrez 02/14/1976 [id number]  Referring provider:  Venita Lick, NP [address] [place],  [place] [address]  Urological history  Hypogonadism  -  He had his testosterone drawn on 05/29/2021 it was 258. He had it redrawn on 06/28/2021 and was 236.  - LH 3.0 and FSH 3.0 on 07/05/2021  Prolactin 6.4 on 07/05/2021 - Managed on Clomid '25mg'$    2.  Erectile dysfunction -IPSS 9/2  Chief Complaint  Patient presents with   Hypogonadism    3 mo follow up    HPI: Joe Gutierrez is a 46 y.o.malewho presents today for a follow-up with IPSS, PSA, and testosterone.    He reports that his erections have improved. He is still having some issues with his ejaculation. His fatigue has slightly improved. He reports that he has sleep apnea and that he does not sleep with a CPAP.   He has no pain with ejaculation or blood in ejaculatory fluid.   He reports that 25% of the time he feels that he is not emptying his bladder fully but this is not accompanied by pain.   Patient denies any modifying or aggravating factors.  Patient denies any gross hematuria, dysuria or suprapubic/flank pain.  Patient denies any fevers, chills, nausea or vomiting.    IPSS     Row Name 10/07/21 1500         International Prostate Symptom Score   How often have you had the sensation of not emptying your bladder? Less than half the time     How often have you had to urinate less than every two hours? About half the time     How often have you found you stopped and started again several times when you urinated? Less than half the time     How often have you found it difficult to postpone urination? Not at All     How often have you had a weak urinary stream? Not at All     How often have you had to strain to start urination? Less than 1 in 5 times     How many times did you typically get up at night to urinate? 1 Time     Total IPSS Score 9       Quality of Life due to urinary symptoms   If  you were to spend the rest of your life with your urinary condition just the way it is now how would you feel about that? Mostly Satisfied              Score:  1-7 Mild 8-19 Moderate 20-35 Severe   SHIM     Row Name 10/07/21 1517         SHIM: Over the last 6 months:   How do you rate your confidence that you could get and keep an erection? Moderate     When you had erections with sexual stimulation, how often were your erections hard enough for penetration (entering your partner)? Sometimes (about half the time)     During sexual intercourse, how often were you able to maintain your erection after you had penetrated (entered) your partner? Almost Always or Always     During sexual intercourse, how difficult was it to maintain your erection to completion of intercourse? Not Difficult     When you attempted sexual intercourse, how often was it satisfactory for you? A Few Times (much less than half the time)  SHIM Total Score   SHIM 18              Score: 1-7 Severe ED 8-11 Moderate ED 12-16 Mild-Moderate ED 17-21 Mild ED 22-25 No ED   PMH: Past Medical History:  Diagnosis Date   Anxiety    Arthritis    Colon polyp    Depression    Heart murmur    History of kidney stones    Hypertension    Obesity    Sleep apnea     Surgical History: Past Surgical History:  Procedure Laterality Date   APPENDECTOMY     CLOSED REDUCTION NASAL FRACTURE N/A 06/11/2021   Procedure: CLOSED REDUCTION NASAL FRACTURE;  Surgeon: [name], MD;  Location: [facility];  Service: Plastics;  Laterality: N/A;   COLONOSCOPY WITH PROPOFOL N/A 11/16/2020   Procedure: COLONOSCOPY WITH PROPOFOL;  Surgeon: [name], MD;  Location: [facility] ENDOSCOPY;  Service: Gastroenterology;  Laterality: N/A;   LAPAROSCOPIC RIGHT COLECTOMY N/A 01/24/2021   Procedure: LAPAROSCOPIC RIGHT COLECTOMY with Otho Ket, PA-C assisting;  Surgeon: Jules Husbands, MD;  Location: [facility];  Service: General;   Laterality: N/A;   LITHOTRIPSY      Home Medications:  Allergies as of 10/07/2021       Reactions   Codeine Hives   Sulfa Antibiotics Hives        Medication List        Accurate as of October 07, 2021  3:42 PM. If you have any questions, ask your nurse or doctor.          STOP taking these medications    acetaminophen 500 MG tablet Commonly known as: TYLENOL Stopped by: Zara Council, PA-C       TAKE these medications    buPROPion 150 MG 24 hr tablet Commonly known as: Wellbutrin XL Take 1 tablet (150 mg total) by mouth daily.   busPIRone 5 MG tablet Commonly known as: BUSPAR Take 1 tablet (5 mg total) by mouth 2 (two) times daily.   Clomid 50 MG tablet Generic drug: clomiPHENE Take 0.5 tablets (25 mg total) by mouth daily.   lisinopril 40 MG tablet Commonly known as: ZESTRIL TAKE 1 TABLET(40 MG) BY MOUTH DAILY   sertraline 100 MG tablet Commonly known as: ZOLOFT TAKE 2 TABLETS BY MOUTH EVERY DAY   Vitamin D (Cholecalciferol) 25 MCG (1000 UT) Tabs Take 1,000 Units by mouth daily.        Allergies:  Allergies  Allergen Reactions   Codeine Hives   Sulfa Antibiotics Hives    Family History: Family History  Problem Relation Age of Onset   Diabetes Mother    Hypertension Mother    Alcohol abuse Father    Heart disease Father    Hypertension Father    Diabetes Maternal Grandmother    Stroke Maternal Grandfather    Hypertension Brother    Asthma Daughter    Heart disease Paternal Grandfather     Social History:  reports that he has never smoked. He has never used smokeless tobacco. He reports that he does not drink alcohol and does not use drugs.   Physical Exam: BP 131/81   Pulse 89   Ht '5\' 10"'$  (1.778 m)   Wt 256 lb (116.1 kg)   BMI 36.73 kg/m   Constitutional:  Alert and oriented, No acute distress. HEENT: Minor AT, moist mucus membranes.  Trachea midline Cardiovascular: No clubbing, cyanosis, or edema. Respiratory: Normal  respiratory effort, no increased work of  breathing. Neurologic: Grossly intact, no focal deficits, moving all 4 extremities. Psychiatric: Normal mood and affect.  Laboratory Data: Pending   Assessment & Plan:    Hypogonadism  - Symptoms slightly improved  - Continue Clomid 25 mg  - Testosterone; pending    2.  Erectile dysfunction - Symptoms have improved on Clomid  - discussed that if his testosterone level returned at therapeutic levels, he would want to give the Clomid more time to see if erections improved - return in three months to recheck levels exam and SHIM  - if testosterone level did not improve, will increase to Clomid 50 mg daily and recheck levels in three months   Return in about 3 months (around 01/07/2022) for testosterone, H & H, LFT's , IPSS, SHIM, PVR and exam.  [facility] [address], [place], [place] [address] [phone]  I, Estill Dooms, acting as a Education administrator for [facility], PA-C.,have documented all relevant documentation on the behalf of Jatorian Renault, PA-C,as directed by  [facility], PA-C while in the presence of Shandreka Dante, PA-C.  I have reviewed the above documentation for accuracy and completeness, and I agree with the above.    Zara Council, PA-C

## 2021-10-08 LAB — TESTOSTERONE: Testosterone: 444 ng/dL (ref 264–916)

## 2021-10-21 ENCOUNTER — Other Ambulatory Visit: Payer: Self-pay | Admitting: Nurse Practitioner

## 2021-10-23 NOTE — Telephone Encounter (Signed)
Refilled 05/29/2021 #[id number] refills - confirmed by same pharmacy. Requested Prescriptions  Pending Prescriptions Disp Refills  . buPROPion (WELLBUTRIN XL) 150 MG 24 hr tablet [Pharmacy Med Name: BUPROPION HCL XL 150 MG TABLET] 90 tablet 3    Sig: TAKE 1 TABLET BY MOUTH EVERY DAY     Psychiatry: Antidepressants - bupropion Passed - 10/21/2021  9:30 AM      Passed - Cr in normal range and within 360 days    Creatinine  Date Value Ref Range Status  08/09/2012 0.96 0.60 - 1.30 mg/dL Final   Creatinine, Ser  Date Value Ref Range Status  05/29/2021 0.83 0.76 - 1.27 mg/dL Final         Passed - AST in normal range and within 360 days    AST  Date Value Ref Range Status  10/07/2021 21 15 - 41 U/L Final   SGOT(AST)  Date Value Ref Range Status  08/09/2012 24 15 - 37 Unit/L Final         Passed - ALT in normal range and within 360 days    ALT  Date Value Ref Range Status  10/07/2021 17 0 - 44 U/L Final   SGPT (ALT)  Date Value Ref Range Status  08/09/2012 22 12 - 78 U/L Final         Passed - Completed PHQ-2 or PHQ-9 in the last 360 days      Passed - Last BP in normal range    BP Readings from Last 1 Encounters:  10/07/21 131/81         Passed - Valid encounter within last 6 months    Recent Outpatient Visits          1 month ago Acute pain of right shoulder   [place], Henrine Screws T, NP   3 months ago Depression with anxiety   [place], Randall T, NP   4 months ago Essential hypertension, benign   [facility] [place], [place] T, NP   11 months ago Depression with anxiety   [place], Elbert T, NP   12 months ago Acute pain of right wrist   [place], Barbaraann Faster, NP      Future Appointments            In 1 month Cannady, Barbaraann Faster, NP [employer], [place]   In 2 months [place], Gordan Payment [place]

## 2021-12-08 NOTE — Patient Instructions (Signed)

## 2021-12-09 ENCOUNTER — Encounter: Payer: Self-pay | Admitting: Nurse Practitioner

## 2021-12-09 ENCOUNTER — Ambulatory Visit (INDEPENDENT_AMBULATORY_CARE_PROVIDER_SITE_OTHER): Payer: 59 | Admitting: Nurse Practitioner

## 2021-12-09 VITALS — BP 118/82 | HR 72 | Temp 98.6°F | Ht 70.0 in | Wt 267.1 lb

## 2021-12-09 DIAGNOSIS — F418 Other specified anxiety disorders: Secondary | ICD-10-CM | POA: Diagnosis not present

## 2021-12-09 DIAGNOSIS — E559 Vitamin D deficiency, unspecified: Secondary | ICD-10-CM

## 2021-12-09 DIAGNOSIS — E7800 Pure hypercholesterolemia, unspecified: Secondary | ICD-10-CM

## 2021-12-09 DIAGNOSIS — I10 Essential (primary) hypertension: Secondary | ICD-10-CM

## 2021-12-09 DIAGNOSIS — Z0000 Encounter for general adult medical examination without abnormal findings: Secondary | ICD-10-CM

## 2021-12-09 DIAGNOSIS — R7301 Impaired fasting glucose: Secondary | ICD-10-CM

## 2021-12-09 DIAGNOSIS — G4733 Obstructive sleep apnea (adult) (pediatric): Secondary | ICD-10-CM

## 2021-12-09 DIAGNOSIS — E6609 Other obesity due to excess calories: Secondary | ICD-10-CM

## 2021-12-09 DIAGNOSIS — Z6835 Body mass index (BMI) 35.0-35.9, adult: Secondary | ICD-10-CM

## 2021-12-09 DIAGNOSIS — R7989 Other specified abnormal findings of blood chemistry: Secondary | ICD-10-CM

## 2021-12-09 DIAGNOSIS — Z23 Encounter for immunization: Secondary | ICD-10-CM

## 2021-12-09 DIAGNOSIS — N522 Drug-induced erectile dysfunction: Secondary | ICD-10-CM

## 2021-12-09 MED ORDER — BUPROPION HCL ER (XL) 150 MG PO TB24: 150.0000 mg | ORAL_TABLET | Freq: Every day | ORAL | 4 refills | Status: DC

## 2021-12-09 MED ORDER — BUSPIRONE HCL 5 MG PO TABS: 5.0000 mg | ORAL_TABLET | Freq: Two times a day (BID) | ORAL | 4 refills | Status: DC

## 2021-12-09 MED ORDER — LISINOPRIL 40 MG PO TABS: ORAL_TABLET | ORAL | 4 refills | Status: DC

## 2021-12-09 MED ORDER — SERTRALINE HCL 100 MG PO TABS: 200.0000 mg | ORAL_TABLET | Freq: Every day | ORAL | 4 refills | Status: DC

## 2021-12-09 NOTE — Assessment & Plan Note (Signed)
Consider repeat sleep study in future, at this time continue side sleeping as instructed.

## 2021-12-09 NOTE — Assessment & Plan Note (Signed)
Noted on past labs, check lipid panel today and CMP.  ASCVD 2.5%, diet focus at this time.

## 2021-12-09 NOTE — Assessment & Plan Note (Signed)
Chronic, stable.  BP well below goal today. Continue Lisinopril 40 MG at this time and if elevations above goal in future consider addition of Amlodipine.  Recommend he continue to monitor BP at home and document + focus on DASH diet.  To notify provider if any dry cough presents.  LABS: CMP, CBC, TSH.  Refills sent in.

## 2021-12-09 NOTE — Assessment & Plan Note (Signed)
Chronic, stable, improvement with Wellbutrin on board. Continue Sertraline to 200 MG (max dose), Wellbutrin XL 150 MG daily, and Buspar 5 MG BID.  Denies SI/HI. Return in 6 months.  Continue to collaborate with urology on low testosterone.

## 2021-12-09 NOTE — Assessment & Plan Note (Signed)
BMI 38.32.  Recommended eating smaller high protein, low fat meals more frequently and exercising 30 mins a day 5 times a week with a goal of 10-15lb weight loss in the next 3 months. Patient voiced their understanding and motivation to adhere to these recommendations.

## 2021-12-09 NOTE — Assessment & Plan Note (Signed)
Ongoing.  Will continue to follow along with urology, recent notes reviewed.

## 2021-12-09 NOTE — Progress Notes (Signed)
BP 118/82 (BP Location: Left Arm, Patient Position: Sitting, Cuff Size: Normal)   Pulse 72   Temp 98.6 F (37 C) (Oral)   Ht '5\' 10"'$  (1.778 m)   Wt 267 lb 1.6 oz (121.2 kg)   SpO2 97%   BMI 38.32 kg/m    Subjective:    Patient ID: Joe Gutierrez, male    DOB: [birth year], 46 y.o.   MRN: [id number]  HPI: Joe Gutierrez is a 46 y.o. male presenting on 12/09/2021 for comprehensive medical examination. Current medical complaints include:none  He currently lives with: wife and children Interim Problems from his last visit: no   HYPERTENSION Continues on Lisinopril 40 MG daily, did take this morning.  No current statin. Hypertension status: controlled Satisfied with current treatment? yes Duration of hypertension: chronic BP monitoring frequency: not checking BP range: BP medication side effects:  no Medication compliance: good compliance Aspirin: no Recurrent headaches: no Visual changes: no Palpitations: no Dyspnea: no Chest pain: no Lower extremity edema: no Dizzy/lightheaded: no  The 10-year ASCVD risk score (Arnett DK, et al., 2019) is: 2.5%   Values used to calculate the score:     Age: 64 years     Sex: Male     Is Non-Hispanic African American: No     Diabetic: No     Tobacco smoker: No     Systolic Blood Pressure: 741 mmHg     Is BP treated: Yes     HDL Cholesterol: 45 mg/dL     Total Cholesterol: 195 mg/dL  DEPRESSION Continues on Sertraline 200 MG daily, Wellbutrin XL 150 MG daily, + Buspar 5 BID.  Takes Vitamin D for history low levels.   Mood status: stable Satisfied with current treatment?: yes Symptom severity: mild  Duration of current treatment : chronic Side effects: no Medication compliance: good compliance Psychotherapy/counseling: none Previous psychiatric medications: Paxil and Buspar Depressed mood: no Anxious mood: yes Anhedonia: no Significant weight loss or gain: no Insomnia: none Fatigue: no Feelings of worthlessness or guilt:  no Impaired concentration/indecisiveness: no Suicidal ideations: no Hopelessness: no Crying spells: no     12/09/2021    8:14 AM 08/29/2021   10:04 AM 07/10/2021    9:10 AM 05/29/2021    9:49 AM 11/26/2020    8:26 AM  Depression screen PHQ 2/9  Decreased Interest '1 1 1 2 '$ 0  Down, Depressed, Hopeless 0 0 1 2 0  PHQ - 2 Score '1 1 2 4 '$ 0  Altered sleeping '1 1 2 3 '$ 0  Tired, decreased energy '1 1 2 3 1  '$ Change in appetite 0 '1 2 3 '$ 0  Feeling bad or failure about yourself  0 0 1 2 0  Trouble concentrating '1 1 1 2 1  '$ Moving slowly or fidgety/restless 0 0 1 2 0  Suicidal thoughts  0 0 1 0  PHQ-9 Score '4 5 11 20 2  '$ Difficult doing work/chores Not difficult at all Not difficult at all   Not difficult at all      12/09/2021    8:15 AM 08/29/2021   10:04 AM 07/10/2021    9:10 AM 05/29/2021    9:49 AM  GAD 7 : Generalized Anxiety Score  Nervous, Anxious, on Edge '1 1 1 3  '$ Control/stop worrying 0 0 1 2  Worry too much - different things 0 0 1 2  Trouble relaxing '1 1 1 2  '$ Restless 1 0 1 2  Easily annoyed or irritable 0 1  1 3  Afraid - awful might happen 0 0 0 1  Total GAD 7 Score '3 3 6 15  '$ Anxiety Difficulty Not difficult at all Not difficult at all  Very difficult    IFG:  Recent labs with glucose 100 to 105 -- A1c 5.1% last check.  Polydipsia/polyuria: no Visual disturbance: no Chest pain: no Paresthesias: no  SLEEP APNEA Went for testing in past -- but can not afford CPAP at this time. Sleep apnea status: stable Duration: chronic Satisfied with current treatment?:  no CPAP use:  no Sleep quality with CPAP use: poor Treament compliance:poor compliance Last sleep study:  Treatments attempted:  Wakes feeling refreshed:  no Daytime hypersomnolence:  none Fatigue:  none Insomnia:  none Good sleep hygiene:  yes Difficulty falling asleep:  none Difficulty staying asleep:  none Snoring bothers bed partner:  none Observed apnea by bed partner: none Obesity:  yes Hypertension:  yes  Pulmonary hypertension:  no Coronary artery disease:  no  Functional Status Survey: Is the patient deaf or have difficulty hearing?: No Does the patient have difficulty seeing, even when wearing glasses/contacts?: No Does the patient have difficulty concentrating, remembering, or making decisions?: No Does the patient have difficulty walking or climbing stairs?: No Does the patient have difficulty dressing or bathing?: No Does the patient have difficulty doing errands alone such as visiting a doctor's office or shopping?: No  FALL RISK:    12/09/2021    8:18 AM 12/09/2021    8:14 AM 08/29/2021   10:04 AM 07/10/2021    9:10 AM 05/29/2021    9:49 AM  Fall Risk   Falls in the past year? 0 0 0 0 0  Number falls in past yr: 0 0 0 0 0  Injury with Fall? 0 0 0 0 0  Risk for fall due to : No Fall Risks No Fall Risks No Fall Risks No Fall Risks No Fall Risks  Follow up Falls prevention discussed  Falls evaluation completed Falls evaluation completed Falls evaluation completed    Past Medical History:  Past Medical History:  Diagnosis Date   Anxiety    Arthritis    Colon polyp    Depression    Heart murmur    History of kidney stones    Hypertension    Obesity    Sleep apnea     Surgical History:  Past Surgical History:  Procedure Laterality Date   APPENDECTOMY     CLOSED REDUCTION NASAL FRACTURE N/A 06/11/2021   Procedure: CLOSED REDUCTION NASAL FRACTURE;  Surgeon: [name], MD;  Location: [facility];  Service: Plastics;  Laterality: N/A;   COLONOSCOPY WITH PROPOFOL N/A 11/16/2020   Procedure: COLONOSCOPY WITH PROPOFOL;  Surgeon: [name], MD;  Location: [facility] ENDOSCOPY;  Service: Gastroenterology;  Laterality: N/A;   LAPAROSCOPIC RIGHT COLECTOMY N/A 01/24/2021   Procedure: LAPAROSCOPIC RIGHT COLECTOMY with Otho Ket, PA-C assisting;  Surgeon: Jules Husbands, MD;  Location: [facility];  Service: General;  Laterality: N/A;   LITHOTRIPSY      Medications:  Current  Outpatient Medications on File Prior to Visit  Medication Sig   clomiPHENE (CLOMID) 50 MG tablet Take 0.5 tablets (25 mg total) by mouth daily.   Vitamin D, Cholecalciferol, 25 MCG (1000 UT) TABS Take 1,000 Units by mouth daily.   No current facility-administered medications on file prior to visit.    Allergies:  Allergies  Allergen Reactions   Codeine Hives   Sulfa Antibiotics Hives  Social History:  Social History   Socioeconomic History   Marital status: Married    Spouse name: Tabatha   Number of children: 3   Years of education: Not on file   Highest education level: Not on file  Occupational History   Not on file  Tobacco Use   Smoking status: Never   Smokeless tobacco: Never  Vaping Use   Vaping Use: Never used  Substance and Sexual Activity   Alcohol use: No   Drug use: No   Sexual activity: Yes  Other Topics Concern   Not on file  Social History Narrative   Not on file   Social Determinants of Health   Financial Resource Strain: Low Risk  (11/26/2020)   Overall Financial Resource Strain (CARDIA)    Difficulty of Paying Living Expenses: Not hard at all  Food Insecurity: No Food Insecurity (11/26/2020)   Hunger Vital Sign    Worried About Running Out of Food in the Last Year: Never true    Ran Out of Food in the Last Year: Never true  Transportation Needs: No Transportation Needs (11/26/2020)   PRAPARE - Hydrologist (Medical): No    Lack of Transportation (Non-Medical): No  Physical Activity: Sufficiently Active (11/26/2020)   Exercise Vital Sign    Days of Exercise per Week: 5 days    Minutes of Exercise per Session: 30 min  Stress: Stress Concern Present (11/26/2020)   [place]    Feeling of Stress : To some extent  Social Connections: Moderately Integrated (11/26/2020)   Social Connection and Isolation Panel [NHANES]    Frequency of Communication  with Friends and Family: Three times a week    Frequency of Social Gatherings with Friends and Family: Three times a week    Attends Religious Services: More than 4 times per year    Active Member of Clubs or Organizations: No    Attends Archivist Meetings: Never    Marital Status: Married  Human resources officer Violence: Not At Risk (11/26/2020)   Humiliation, Afraid, Rape, and Kick questionnaire    Fear of Current or Ex-Partner: No    Emotionally Abused: No    Physically Abused: No    Sexually Abused: No   Social History   Tobacco Use  Smoking Status Never  Smokeless Tobacco Never   Social History   Substance and Sexual Activity  Alcohol Use No    Family History:  Family History  Problem Relation Age of Onset   Diabetes Mother    Hypertension Mother    Alcohol abuse Father    Heart disease Father    Hypertension Father    Diabetes Maternal Grandmother    Stroke Maternal Grandfather    Hypertension Brother    Asthma Daughter    Heart disease Paternal Grandfather     Past medical history, surgical history, medications, allergies, family history and social history reviewed with patient today and changes made to appropriate areas of the chart.   Review of Systems - negative All other ROS negative except what is listed above and in the HPI.      Objective:    BP 118/82 (BP Location: Left Arm, Patient Position: Sitting, Cuff Size: Normal)   Pulse 72   Temp 98.6 F (37 C) (Oral)   Ht '5\' 10"'$  (1.778 m)   Wt 267 lb 1.6 oz (121.2 kg)   SpO2 97%   BMI 38.32 kg/m  Wt Readings from Last 3 Encounters:  12/09/21 267 lb 1.6 oz (121.2 kg)  10/07/21 256 lb (116.1 kg)  08/29/21 259 lb (117.5 kg)    Physical Exam Constitutional:      General: He is awake. He is not in acute distress.    Appearance: He is well-developed and well-groomed. He is obese. He is not ill-appearing or toxic-appearing.  HENT:     Head: Normocephalic.     Right Ear: Hearing, tympanic  membrane, ear canal and external ear normal.     Left Ear: Hearing, tympanic membrane, ear canal and external ear normal.     Nose: Nose normal.     Mouth/Throat:     Mouth: Mucous membranes are moist.     Pharynx: Oropharynx is clear.  Eyes:     General: Lids are normal. No scleral icterus.    Extraocular Movements: Extraocular movements intact.     Conjunctiva/sclera: Conjunctivae normal.     Pupils: Pupils are equal, round, and reactive to light.     Visual Fields: Right eye visual fields normal and left eye visual fields normal.  Neck:     Thyroid: No thyromegaly.     Vascular: No carotid bruit.     Trachea: Trachea normal.  Cardiovascular:     Rate and Rhythm: Normal rate and regular rhythm.     Heart sounds: No murmur heard. Pulmonary:     Effort: No accessory muscle usage or respiratory distress.  Abdominal:     General: Bowel sounds are normal. There is no distension.     Palpations: Abdomen is soft.     Tenderness: There is no abdominal tenderness.  Musculoskeletal:     Cervical back: Full passive range of motion without pain and normal range of motion.     Right lower leg: No edema.     Left lower leg: No edema.  Lymphadenopathy:     Head:     Right side of head: No submental, submandibular, tonsillar, preauricular or posterior auricular adenopathy.     Left side of head: No submental, submandibular, tonsillar, preauricular or posterior auricular adenopathy.     Cervical: No cervical adenopathy.     Right cervical: No superficial or posterior cervical adenopathy.    Left cervical: No superficial or posterior cervical adenopathy.  Skin:    General: Skin is warm.     Capillary Refill: Capillary refill takes less than 2 seconds.     Findings: No rash.  Neurological:     Mental Status: He is alert.     Motor: Motor function is intact.     Coordination: Coordination is intact.     Gait: Gait is intact.     Deep Tendon Reflexes: Reflexes are normal and symmetric.      Reflex Scores:      Brachioradialis reflexes are 2+ on the right side and 2+ on the left side.      Patellar reflexes are 2+ on the right side and 2+ on the left side. Psychiatric:        Attention and Perception: Attention normal.        Mood and Affect: Mood normal.        Speech: Speech normal.        Behavior: Behavior normal. Behavior is cooperative.        Thought Content: Thought content normal.     Results for orders placed or performed during the hospital encounter of 10/07/21  Hepatic function panel  Result Value Ref  Range   Total Protein 6.9 6.5 - 8.1 g/dL   Albumin 4.2 3.5 - 5.0 g/dL   AST 21 15 - 41 U/L   ALT 17 0 - 44 U/L   Alkaline Phosphatase 53 38 - 126 U/L   Total Bilirubin 0.5 0.3 - 1.2 mg/dL   Bilirubin, Direct <0.1 0.0 - 0.2 mg/dL   Indirect Bilirubin NOT CALCULATED 0.3 - 0.9 mg/dL  PSA  Result Value Ref Range   Prostatic Specific Antigen 1.10 0.00 - 4.00 ng/mL  Testosterone  Result Value Ref Range   Testosterone 444 264 - 916 ng/dL      Assessment & Plan:   Problem List Items Addressed This Visit       Cardiovascular and Mediastinum   Essential hypertension, benign - Primary    Chronic, stable.  BP well below goal today. Continue Lisinopril 40 MG at this time and if elevations above goal in future consider addition of Amlodipine.  Recommend he continue to monitor BP at home and document + focus on DASH diet.  To notify provider if any dry cough presents.  LABS: CMP, CBC, TSH.  Refills sent in.      Relevant Medications   lisinopril (ZESTRIL) 40 MG tablet   Other Relevant Orders   CBC with Differential/Platelet   Comprehensive metabolic panel   TSH     Respiratory   Sleep apnea    Consider repeat sleep study in future, at this time continue side sleeping as instructed.      Relevant Orders   CBC with Differential/Platelet   Comprehensive metabolic panel     Endocrine   IFG (impaired fasting glucose)    A1c remains stable today at 5.1%.   Recheck annually.      Relevant Orders   HgB A1c     Other   Depression with anxiety    Chronic, stable, improvement with Wellbutrin on board. Continue Sertraline to 200 MG (max dose), Wellbutrin XL 150 MG daily, and Buspar 5 MG BID.  Denies SI/HI. Return in 6 months.  Continue to collaborate with urology on low testosterone.      Relevant Medications   buPROPion (WELLBUTRIN XL) 150 MG 24 hr tablet   busPIRone (BUSPAR) 5 MG tablet   sertraline (ZOLOFT) 100 MG tablet   Other Relevant Orders   Comprehensive metabolic panel   Elevated low density lipoprotein (LDL) cholesterol level    Noted on past labs, check lipid panel today and CMP.  ASCVD 2.5%, diet focus at this time.      Relevant Orders   Comprehensive metabolic panel   Lipid Panel w/o Chol/HDL Ratio   Low testosterone in male    Ongoing.  Will continue to follow along with urology, recent notes reviewed.      Obesity    BMI 38.32.  Recommended eating smaller high protein, low fat meals more frequently and exercising 30 mins a day 5 times a week with a goal of 10-15lb weight loss in the next 3 months. Patient voiced their understanding and motivation to adhere to these recommendations.       Vitamin D deficiency    Ongoing, stable.  Continue supplement daily and recheck level next visit.      Relevant Orders   VITAMIN D 25 Hydroxy (Vit-D Deficiency, Fractures)   Other Visit Diagnoses     Flu vaccine need       Flu vaccine in office today.   Relevant Orders   Flu Vaccine QUAD 6+ mos  PF IM (Fluarix Quad PF) (Completed)   Encounter for annual physical exam       Annual physical today with labs and health maintenance reviewed, discussed with patient.       Discussed aspirin prophylaxis for myocardial infarction prevention and decision was it was not indicated  LABORATORY TESTING:  Health maintenance labs ordered today as discussed above.   The natural history of prostate cancer and ongoing controversy  regarding screening and potential treatment outcomes of prostate cancer has been discussed with the patient. The meaning of a false positive PSA and a false negative PSA has been discussed. He indicates understanding of the limitations of this screening test and wishes to proceed with screening PSA testing when he turns 50.   IMMUNIZATIONS:   - Tdap: Tetanus vaccination status reviewed: last tetanus booster within 10 years. - Influenza: Up to date -- provided today - Pneumovax: Not applicable - Prevnar: Not applicable - Zostavax vaccine: Not applicable  SCREENING: - Colonoscopy: Up To Date Discussed with patient purpose of the colonoscopy is to detect colon cancer at curable precancerous or early stages   - AAA Screening: Not applicable  -Hearing Test: Not applicable  -Spirometry: Not applicable   PATIENT COUNSELING:    Sexuality: Discussed sexually transmitted diseases, partner selection, use of condoms, avoidance of unintended pregnancy  and contraceptive alternatives.   Advised to avoid cigarette smoking.  I discussed with the patient that most people either abstain from alcohol or drink within safe limits (<=14/week and <=4 drinks/occasion for males, <=7/weeks and <= 3 drinks/occasion for females) and that the risk for alcohol disorders and other health effects rises proportionally with the number of drinks per week and how often a drinker exceeds daily limits.  Discussed cessation/primary prevention of drug use and availability of treatment for abuse.   Diet: Encouraged to adjust caloric intake to maintain  or achieve ideal body weight, to reduce intake of dietary saturated fat and total fat, to limit sodium intake by avoiding high sodium foods and not adding table salt, and to maintain adequate dietary potassium and calcium preferably from fresh fruits, vegetables, and low-fat dairy products.    Stressed the importance of regular exercise  Injury prevention: Discussed safety  belts, safety helmets, smoke detector, smoking near bedding or upholstery.   Dental health: Discussed importance of regular tooth brushing, flossing, and dental visits.   Follow up plan: NEXT PREVENTATIVE PHYSICAL DUE IN 1 YEAR. Return in about 6 months (around 06/10/2022) for HTN/HLD, MOOD.

## 2021-12-09 NOTE — Assessment & Plan Note (Signed)
Ongoing, stable.  Continue supplement daily and recheck level next visit.

## 2021-12-09 NOTE — Assessment & Plan Note (Signed)
A1c remains stable today at 5.1%.  Recheck annually.

## 2021-12-10 ENCOUNTER — Other Ambulatory Visit: Payer: Self-pay | Admitting: Nurse Practitioner

## 2021-12-10 ENCOUNTER — Encounter: Payer: Self-pay | Admitting: Nurse Practitioner

## 2021-12-10 DIAGNOSIS — R7989 Other specified abnormal findings of blood chemistry: Secondary | ICD-10-CM

## 2021-12-10 LAB — COMPREHENSIVE METABOLIC PANEL
ALT: 14 IU/L (ref 0–44)
AST: 18 IU/L (ref 0–40)
Albumin/Globulin Ratio: 2 (ref 1.2–2.2)
Albumin: 4.3 g/dL (ref 4.1–5.1)
Alkaline Phosphatase: 84 IU/L (ref 44–121)
BUN/Creatinine Ratio: 12 (ref 9–20)
BUN: 11 mg/dL (ref 6–24)
Bilirubin Total: 0.5 mg/dL (ref 0.0–1.2)
CO2: 21 mmol/L (ref 20–29)
Calcium: 9 mg/dL (ref 8.7–10.2)
Chloride: 102 mmol/L (ref 96–106)
Creatinine, Ser: 0.92 mg/dL (ref 0.76–1.27)
Globulin, Total: 2.1 g/dL (ref 1.5–4.5)
Glucose: 90 mg/dL (ref 70–99)
Potassium: 4.1 mmol/L (ref 3.5–5.2)
Sodium: 138 mmol/L (ref 134–144)
Total Protein: 6.4 g/dL (ref 6.0–8.5)
eGFR: 104 mL/min/{1.73_m2} (ref >59)

## 2021-12-10 LAB — CBC WITH DIFFERENTIAL/PLATELET
Basophils Absolute: 0.1 10*3/uL (ref 0.0–0.2)
Basos: 0 %
EOS (ABSOLUTE): 0.1 10*3/uL (ref 0.0–0.4)
Eos: 1 %
Hematocrit: 43.7 % (ref 37.5–51.0)
Hemoglobin: 14.6 g/dL (ref 13.0–17.7)
Immature Grans (Abs): 0 10*3/uL (ref 0.0–0.1)
Immature Granulocytes: 0 %
Lymphocytes Absolute: 1.5 10*3/uL (ref 0.7–3.1)
Lymphs: 11 %
MCH: 29.2 pg (ref 26.6–33.0)
MCHC: 33.4 g/dL (ref 31.5–35.7)
MCV: 87 fL (ref 79–97)
Monocytes Absolute: 1.2 10*3/uL — ABNORMAL HIGH (ref 0.1–0.9)
Monocytes: 8 %
Neutrophils Absolute: 11 10*3/uL — ABNORMAL HIGH (ref 1.4–7.0)
Neutrophils: 80 %
Platelets: 217 10*3/uL (ref 150–450)
RBC: 5 x10E6/uL (ref 4.14–5.80)
RDW: 12.8 % (ref 11.6–15.4)
WBC: 13.9 10*3/uL — ABNORMAL HIGH (ref 3.4–10.8)

## 2021-12-10 LAB — HEMOGLOBIN A1C
Est. average glucose Bld gHb Est-mCnc: 117 mg/dL
Hgb A1c MFr Bld: 5.7 % — ABNORMAL HIGH (ref 4.8–5.6)

## 2021-12-10 LAB — LIPID PANEL W/O CHOL/HDL RATIO
Cholesterol, Total: 163 mg/dL (ref 100–199)
HDL: 43 mg/dL (ref >39)
LDL Chol Calc (NIH): 100 mg/dL — ABNORMAL HIGH (ref 0–99)
Triglycerides: 108 mg/dL (ref 0–149)
VLDL Cholesterol Cal: 20 mg/dL (ref 5–40)

## 2021-12-10 LAB — TSH: TSH: 0.52 u[IU]/mL (ref 0.450–4.500)

## 2021-12-10 LAB — VITAMIN D 25 HYDROXY (VIT D DEFICIENCY, FRACTURES): Vit D, 25-Hydroxy: 27.7 ng/mL — ABNORMAL LOW (ref 30.0–100.0)

## 2021-12-10 NOTE — Progress Notes (Signed)
Contacted via [facility] -- needs lab only visit in 4 weeks please  The 10-year ASCVD risk score (Arnett DK, et al., 2019) is: 2%   Values used to calculate the score:     Age: 46 years     Sex: Male     Is Non-Hispanic African American: No     Diabetic: No     Tobacco smoker: No     Systolic Blood Pressure: 093 mmHg     Is BP treated: Yes     HDL Cholesterol: 43 mg/dL     Total Cholesterol: 163 mg/dL   Good evening Joe Gutierrez, your labs have returned: - CBC showing mild elevation in white blood cell count and neutrophils.  Have you been sick recently?  When was last surgery again?  Would like to recheck on outpatient labs in 4 weeks please, staff will call. - Cholesterol labs remain elevated, but have trended down.  Good job!!  Continue diet focus and regular activity. - Vitamin D a little low, ensure to take supplement daily. - A1c in prediabetic range at 5.7%, any level 5.7% to 6.4% is prediabetes - focus on diet change and regular exercise. - Remainder of labs stable.  Any questions? Keep being amazing!!  Thank you for allowing me to participate in your care.  I appreciate you. Kindest regards, Alisa Stjames

## 2021-12-24 ENCOUNTER — Encounter: Payer: Self-pay | Admitting: Nurse Practitioner

## 2021-12-30 ENCOUNTER — Ambulatory Visit: Payer: 59 | Admitting: Nurse Practitioner

## 2021-12-30 ENCOUNTER — Encounter: Payer: Self-pay | Admitting: Nurse Practitioner

## 2021-12-30 VITALS — BP 110/73 | HR 71 | Temp 98.0°F | Ht 70.0 in | Wt 257.2 lb

## 2021-12-30 DIAGNOSIS — R051 Acute cough: Secondary | ICD-10-CM

## 2021-12-30 DIAGNOSIS — R059 Cough, unspecified: Secondary | ICD-10-CM | POA: Insufficient documentation

## 2021-12-30 MED ORDER — AMOXICILLIN-POT CLAVULANATE 875-125 MG PO TABS: 1.0000 | ORAL_TABLET | Freq: Two times a day (BID) | ORAL | 0 refills | Status: AC

## 2021-12-30 MED ORDER — ALBUTEROL SULFATE HFA 108 (90 BASE) MCG/ACT IN AERS: 2.0000 | INHALATION_SPRAY | Freq: Four times a day (QID) | RESPIRATORY_TRACT | 3 refills | Status: DC | PRN

## 2021-12-30 MED ORDER — PREDNISONE 20 MG PO TABS: 40.0000 mg | ORAL_TABLET | Freq: Every day | ORAL | 0 refills | Status: AC

## 2021-12-30 MED ORDER — BENZONATATE 100 MG PO CAPS: 100.0000 mg | ORAL_CAPSULE | Freq: Three times a day (TID) | ORAL | 0 refills | Status: DC | PRN

## 2021-12-30 NOTE — Assessment & Plan Note (Signed)
Acute for one week with negative Covid testing at home.  Suspect more bacterial at this time, waxing and waning for over a week.  Will start Augmentin BID for 7 days, Prednisone 40 MG daily for 5 days, Albuterol inhaler PRN, and Tessalon PRN.  Recommend: - Increased rest - Increasing Fluids - Acetaminophen as needed for fever/pain.  - Salt water gargling, chloraseptic spray and throat lozenges - OTC Coricidin - Mucinex.  - Humidifying the air.  Return for worsening or ongoing.

## 2021-12-30 NOTE — Patient Instructions (Signed)

## 2021-12-30 NOTE — Progress Notes (Signed)
Acute Office Visit  Subjective:     Patient ID: Joe Gutierrez, male    DOB: 11-13-1975, 46 y.o.   MRN: [id number]  Chief Complaint  Patient presents with   Cough    Started last Monday, chest aching. Has been taking OTC Coricidin with no relief     Has had a cough present for one week.  Taking over the counter Coricidin, helps a little bit but not 100%.  Did test for Covid and this was negative.  One week ago started with aching and pressure to chest with cough.  Waxes and wanes from improving to worse.    Cough This is a new problem. The current episode started 1 to 4 weeks ago. The problem has been unchanged. The problem occurs every few minutes. The cough is Productive of sputum. Associated symptoms include myalgias, postnasal drip, shortness of breath (a little bit with coughing) and wheezing. Pertinent negatives include no chest pain, chills, ear congestion, ear pain, fever, headaches, hemoptysis, nasal congestion, rash, rhinorrhea, sore throat, sweats or weight loss.   Patient is in today for cough present.  Review of Systems  Constitutional:  Negative for chills, fever and weight loss.  HENT:  Positive for postnasal drip. Negative for congestion, ear discharge, ear pain, rhinorrhea, sinus pain and sore throat.   Respiratory:  Positive for cough, shortness of breath (a little bit with coughing) and wheezing. Negative for hemoptysis.   Cardiovascular:  Negative for chest pain, palpitations, orthopnea and leg swelling.  Gastrointestinal: Negative.   Musculoskeletal:  Positive for myalgias.  Skin:  Negative for rash.  Neurological:  Negative for headaches.  Psychiatric/Behavioral: Negative.        Objective:    BP 110/73   Pulse 71   Temp 98 F (36.7 C) (Oral)   Ht '5\' 10"'$  (1.778 m)   Wt 257 lb 3.2 oz (116.7 kg)   SpO2 96%   BMI 36.90 kg/m  BP Readings from Last 3 Encounters:  12/30/21 110/73  12/09/21 118/82  10/07/21 131/81   Wt Readings from Last 3  Encounters:  12/30/21 257 lb 3.2 oz (116.7 kg)  12/09/21 267 lb 1.6 oz (121.2 kg)  10/07/21 256 lb (116.1 kg)   Physical Exam Vitals and nursing note reviewed.  Constitutional:      General: He is awake. He is not in acute distress.    Appearance: He is well-developed, well-groomed and overweight. He is not ill-appearing.  HENT:     Head: Normocephalic and atraumatic.     Right Ear: Hearing, ear canal and external ear normal. No drainage. A middle ear effusion is present. There is no impacted cerumen. Tympanic membrane is not injected or perforated.     Left Ear: Hearing, ear canal and external ear normal. No drainage. A middle ear effusion is present. There is no impacted cerumen. Tympanic membrane is not injected or perforated.     Nose: Rhinorrhea present. Rhinorrhea is clear.     Right Sinus: No maxillary sinus tenderness or frontal sinus tenderness.     Left Sinus: No maxillary sinus tenderness or frontal sinus tenderness.     Mouth/Throat:     Mouth: Mucous membranes are moist.     Pharynx: Posterior oropharyngeal erythema (mild with cobblestone appearance) present. No pharyngeal swelling or oropharyngeal exudate.  Eyes:     General: Lids are normal.        Right eye: No discharge.        Left eye: No discharge.  Conjunctiva/sclera: Conjunctivae normal.     Pupils: Pupils are equal, round, and reactive to light.  Neck:     Vascular: No carotid bruit.  Cardiovascular:     Rate and Rhythm: Normal rate and regular rhythm.     Heart sounds: Normal heart sounds, S1 normal and S2 normal. No murmur heard.    No gallop.  Pulmonary:     Effort: Pulmonary effort is normal. No accessory muscle usage or respiratory distress.     Breath sounds: Wheezing present.     Comments: Scattered expiratory wheezes throughout. Abdominal:     General: Bowel sounds are normal.     Palpations: Abdomen is soft.  Musculoskeletal:     Cervical back: Normal range of motion and neck supple.      Right knee: Normal.     Left knee: Normal.     Right lower leg: No edema.     Left lower leg: No edema.  Skin:    General: Skin is warm and dry.  Neurological:     Mental Status: He is alert and oriented to person, place, and time.  Psychiatric:        Attention and Perception: Attention normal.        Mood and Affect: Mood normal.        Speech: Speech normal.        Behavior: Behavior normal. Behavior is cooperative.    No results found for any visits on 12/30/21.     Assessment & Plan:   Problem List Items Addressed This Visit       Other   Cough - Primary    Acute for one week with negative Covid testing at home.  Suspect more bacterial at this time, waxing and waning for over a week.  Will start Augmentin BID for 7 days, Prednisone 40 MG daily for 5 days, Albuterol inhaler PRN, and Tessalon PRN.  Recommend: - Increased rest - Increasing Fluids - Acetaminophen as needed for fever/pain.  - Salt water gargling, chloraseptic spray and throat lozenges - OTC Coricidin - Mucinex.  - Humidifying the air.  Return for worsening or ongoing.       Meds ordered this encounter  Medications   amoxicillin-clavulanate (AUGMENTIN) 875-125 MG tablet    Sig: Take 1 tablet by mouth 2 (two) times daily for 7 days.    Dispense:  14 tablet    Refill:  0   predniSONE (DELTASONE) 20 MG tablet    Sig: Take 2 tablets (40 mg total) by mouth daily with breakfast for 5 days.    Dispense:  10 tablet    Refill:  0   albuterol (VENTOLIN HFA) 108 (90 Base) MCG/ACT inhaler    Sig: Inhale 2 puffs into the lungs every 6 (six) hours as needed for wheezing or shortness of breath.    Dispense:  8 g    Refill:  3   benzonatate (TESSALON PERLES) 100 MG capsule    Sig: Take 1 capsule (100 mg total) by mouth 3 (three) times daily as needed for cough.    Dispense:  45 capsule    Refill:  0    Return if symptoms worsen or fail to improve.  Venita Lick, NP

## 2022-01-03 NOTE — Progress Notes (Deleted)
01/03/22 9:23 PM   Joe Gutierrez November 13, 1975 [id number]  Referring provider:  Venita Lick, NP [address] [place],  [place] [address]  Urological history  1. Testosterone deficiency -contributing factors of age, sleep apnea -testosterone level pending -Clomid 50 mg daily   2.  Erectile dysfunction -contributing factors of age, sleep apnea, HTN, BPH, HLD and testosterone deficiency -SHIM ***  3. Prostate cancer screening -PSA (09/2021) 1.10  4. LU TS -Contributing factors of age, hypertension, sleep apnea, depression, anxiety, obesity, No chief complaint on file.   HPI: Joe Gutierrez is a 46 y.o.malewho presents today for 3 month follow-up.  At his visit on 10/07/2021, he reported that his erections had improved. He wa still having some issues with his ejaculation. His fatigue had slightly improved. He reported that he had sleep apnea and that he does not sleep with a CPAP.   He had no pain with ejaculation or blood in ejaculatory fluid.   He reported that 25% of the time he felt that he was not emptying his bladder fully but this was not accompanied by pain.  His testosterone level returned at 444.  He was then instructed to increase his Clomid 50 mg daily.  I PSS ***  PVR ***      Score:  1-7 Mild 8-19 Moderate 20-35 Severe  SHIM ***   Score: 1-7 Severe ED 8-11 Moderate ED 12-16 Mild-Moderate ED 17-21 Mild ED 22-25 No ED   PMH: Past Medical History:  Diagnosis Date   Anxiety    Arthritis    Colon polyp    Depression    Heart murmur    History of kidney stones    Hypertension    Obesity    Sleep apnea     Surgical History: Past Surgical History:  Procedure Laterality Date   APPENDECTOMY     CLOSED REDUCTION NASAL FRACTURE N/A 06/11/2021   Procedure: CLOSED REDUCTION NASAL FRACTURE;  Surgeon: [name], MD;  Location: [facility];  Service: Plastics;  Laterality: N/A;   COLONOSCOPY WITH PROPOFOL N/A 11/16/2020   Procedure: COLONOSCOPY WITH  PROPOFOL;  Surgeon: [name], MD;  Location: [facility] ENDOSCOPY;  Service: Gastroenterology;  Laterality: N/A;   LAPAROSCOPIC RIGHT COLECTOMY N/A 01/24/2021   Procedure: LAPAROSCOPIC RIGHT COLECTOMY with Otho Ket, PA-C assisting;  Surgeon: Jules Husbands, MD;  Location: [facility] ORS;  Service: General;  Laterality: N/A;   LITHOTRIPSY      Home Medications:  Allergies as of 01/06/2022       Reactions   Codeine Hives   Sulfa Antibiotics Hives        Medication List        Accurate as of January 03, 2022  9:23 PM. If you have any questions, ask your nurse or doctor.          albuterol 108 (90 Base) MCG/ACT inhaler Commonly known as: VENTOLIN HFA Inhale 2 puffs into the lungs every 6 (six) hours as needed for wheezing or shortness of breath.   amoxicillin-clavulanate 875-125 MG tablet Commonly known as: AUGMENTIN Take 1 tablet by mouth 2 (two) times daily for 7 days.   benzonatate 100 MG capsule Commonly known as: Tessalon Perles Take 1 capsule (100 mg total) by mouth 3 (three) times daily as needed for cough.   buPROPion 150 MG 24 hr tablet Commonly known as: Wellbutrin XL Take 1 tablet (150 mg total) by mouth daily.   busPIRone 5 MG tablet Commonly known as: BUSPAR Take 1  tablet (5 mg total) by mouth 2 (two) times daily.   Clomid 50 MG tablet Generic drug: clomiPHENE Take 0.5 tablets (25 mg total) by mouth daily.   lisinopril 40 MG tablet Commonly known as: ZESTRIL TAKE 1 TABLET(40 MG) BY MOUTH DAILY   sertraline 100 MG tablet Commonly known as: ZOLOFT Take 2 tablets (200 mg total) by mouth daily.   Vitamin D (Cholecalciferol) 25 MCG (1000 UT) Tabs Take 1,000 Units by mouth daily.        Allergies:  Allergies  Allergen Reactions   Codeine Hives   Sulfa Antibiotics Hives    Family History: Family History  Problem Relation Age of Onset   Diabetes Mother    Hypertension Mother    Alcohol abuse Father    Heart disease Father    Hypertension  Father    Diabetes Maternal Grandmother    Stroke Maternal Grandfather    Hypertension Brother    Asthma Daughter    Heart disease Paternal Grandfather     Social History:  reports that he has never smoked. He has never used smokeless tobacco. He reports that he does not drink alcohol and does not use drugs.   Physical Exam: There were no vitals taken for this visit.  Constitutional:  Well nourished. Alert and oriented, No acute distress. HEENT: [place] AT, moist mucus membranes.  Trachea midline Cardiovascular: No clubbing, cyanosis, or edema. Respiratory: Normal respiratory effort, no increased work of breathing. Neurologic: Grossly intact, no focal deficits, moving all 4 extremities. Psychiatric: Normal mood and affect.   Laboratory Data: Component     Latest Ref Rng 12/09/2021  Hemoglobin A1C     4.8 - 5.6 % 5.7 (H)   Est. average glucose Bld gHb Est-mCnc     mg/dL 117     Legend: (H) High  Component     Latest Ref Rng 12/09/2021  TSH     0.450 - 4.500 uIU/mL 0.520    Component     Latest Ref Rng 12/09/2021  Cholesterol, Total     100 - 199 mg/dL 163   Triglycerides     0 - 149 mg/dL 108   HDL Cholesterol     >39 mg/dL 43   VLDL Cholesterol Cal     5 - 40 mg/dL 20   LDL Chol Calc (NIH)     0 - 99 mg/dL 100 (H)     Legend: (H) High     Latest Ref Rng & Units 12/09/2021    8:27 AM 05/29/2021   10:20 AM 01/26/2021    6:42 AM  CBC  WBC 3.4 - 10.8 x10E3/uL 13.9  9.0  11.0   Hemoglobin 13.0 - 17.7 g/dL 14.6  15.8  12.0   Hematocrit 37.5 - 51.0 % 43.7  48.3  36.1   Platelets 150 - 450 x10E3/uL 217  246  163        Latest Ref Rng & Units 12/09/2021    8:27 AM 10/07/2021    3:04 PM 05/29/2021   10:20 AM  CMP  Glucose 70 - 99 mg/dL 90   91   BUN 6 - 24 mg/dL 11   14   Creatinine 0.76 - 1.27 mg/dL 0.92   0.83   Sodium 134 - 144 mmol/L 138   142   Potassium 3.5 - 5.2 mmol/L 4.1   4.3   Chloride 96 - 106 mmol/L 102   102   CO2 20 - 29 mmol/L 21   21  Calcium 8.7 - 10.2 mg/dL 9.0   9.1   Total Protein 6.0 - 8.5 g/dL 6.4  6.9    Total Bilirubin 0.0 - 1.2 mg/dL 0.5  0.5    Alkaline Phos 44 - 121 IU/L 84  53    AST 0 - 40 IU/L 18  21    ALT 0 - 44 IU/L 14  17     I have reviewed the labs.   Pertinent Imaging ***    Assessment & Plan:    1. Testosterone deficiency - Testosterone; pending  -continue Clomid 50 mg daily  2.  Erectile dysfunction - Symptoms have improved on Clomid  - discussed that if his testosterone level returned at therapeutic levels, he would want to give the Clomid more time to see if erections improved - return in three months to recheck levels exam and SHIM  - if testosterone level did not improve, will increase to Clomid 50 mg daily and recheck levels in three months   No follow-ups on file.   Joe Gutierrez  [facility] [address], [place], [place] [address] [phone]

## 2022-01-06 ENCOUNTER — Ambulatory Visit: Payer: Managed Care, Other (non HMO) | Admitting: Urology

## 2022-01-06 DIAGNOSIS — E349 Endocrine disorder, unspecified: Secondary | ICD-10-CM

## 2022-01-06 DIAGNOSIS — N529 Male erectile dysfunction, unspecified: Secondary | ICD-10-CM

## 2022-01-08 ENCOUNTER — Other Ambulatory Visit: Payer: 59

## 2022-01-08 DIAGNOSIS — R7989 Other specified abnormal findings of blood chemistry: Secondary | ICD-10-CM

## 2022-01-09 LAB — CBC WITH DIFFERENTIAL/PLATELET
Basophils Absolute: 0 10*3/uL (ref 0.0–0.2)
Basos: 0 %
EOS (ABSOLUTE): 0.2 10*3/uL (ref 0.0–0.4)
Eos: 2 %
Hematocrit: 43.8 % (ref 37.5–51.0)
Hemoglobin: 14.8 g/dL (ref 13.0–17.7)
Immature Grans (Abs): 0.1 10*3/uL (ref 0.0–0.1)
Immature Granulocytes: 1 %
Lymphocytes Absolute: 2.4 10*3/uL (ref 0.7–3.1)
Lymphs: 22 %
MCH: 29.5 pg (ref 26.6–33.0)
MCHC: 33.8 g/dL (ref 31.5–35.7)
MCV: 87 fL (ref 79–97)
Monocytes Absolute: 1.3 10*3/uL — ABNORMAL HIGH (ref 0.1–0.9)
Monocytes: 11 %
Neutrophils Absolute: 7.1 10*3/uL — ABNORMAL HIGH (ref 1.4–7.0)
Neutrophils: 64 %
Platelets: 250 10*3/uL (ref 150–450)
RBC: 5.02 x10E6/uL (ref 4.14–5.80)
RDW: 12.7 % (ref 11.6–15.4)
WBC: 11 10*3/uL — ABNORMAL HIGH (ref 3.4–10.8)

## 2022-01-11 NOTE — Progress Notes (Signed)
Contacted via [place] morning Ika[place], your labs have returned and white blood cell count is trending down.  Great news!!  Still mild elevation present, but suspect this may be related to your recent cough and illness.  Recommend we recheck this next visit.  Any questions? Keep being awesome!!  Thank you for allowing me to participate in your care.  I appreciate you. Kindest regards, Jeferson Boozer

## 2022-01-16 ENCOUNTER — Encounter: Payer: Self-pay | Admitting: Nurse Practitioner

## 2022-01-17 ENCOUNTER — Encounter: Payer: Self-pay | Admitting: Nurse Practitioner

## 2022-01-17 ENCOUNTER — Ambulatory Visit: Payer: Self-pay | Admitting: *Deleted

## 2022-01-17 ENCOUNTER — Other Ambulatory Visit: Payer: Self-pay | Admitting: Nurse Practitioner

## 2022-01-17 ENCOUNTER — Ambulatory Visit
Admission: RE | Admit: 2022-01-17 | Discharge: 2022-01-17 | Disposition: A | Discharge status: Home / Self Care | Payer: 59 | Source: Ambulatory Visit | Attending: Nurse Practitioner | Admitting: Nurse Practitioner

## 2022-01-17 ENCOUNTER — Other Ambulatory Visit: Payer: Self-pay

## 2022-01-17 ENCOUNTER — Ambulatory Visit: Payer: 59 | Admitting: Nurse Practitioner

## 2022-01-17 VITALS — Wt 257.3 lb

## 2022-01-17 DIAGNOSIS — R7989 Other specified abnormal findings of blood chemistry: Secondary | ICD-10-CM

## 2022-01-17 DIAGNOSIS — N50812 Left testicular pain: Secondary | ICD-10-CM

## 2022-01-17 DIAGNOSIS — K921 Melena: Secondary | ICD-10-CM

## 2022-01-17 DIAGNOSIS — E349 Endocrine disorder, unspecified: Secondary | ICD-10-CM

## 2022-01-17 DIAGNOSIS — N529 Male erectile dysfunction, unspecified: Secondary | ICD-10-CM

## 2022-01-17 NOTE — Progress Notes (Signed)
Called and spoke with patient and gave him results over the phone.  Questions answered to the best of my ability.

## 2022-01-17 NOTE — Telephone Encounter (Signed)
Reason for Disposition  Family history of cancer of intestines    He had a precancerous mass removed last Dec in his colon.  Answer Assessment - Initial Assessment Questions 1. APPEARANCE of BLOOD: "What color is it?" "Is it passed separately, on the surface of the stool, or mixed in with the stool?"      Blood in stool yesterday and this morning.   My left testicle is sore and swollen.  Part of my colon was removed last Dec.    I had a mass that was precancerous.      No blood in urine.    2. AMOUNT: "How much blood was passed?"      The blood was on toilet paper a little in toilet yesterday and this morning.  I've been very tired lately. 3. FREQUENCY: "How many times has blood been passed with the stools?"      Twice, once yesterday and once this morning 4. ONSET: "When was the blood first seen in the stools?" (Days or weeks)      Yesterday morning 5. DIARRHEA: "Is there also some diarrhea?" If Yes, ask: "How many diarrhea stools in the past 24 hours?"      Yes intermittently all the time 6. CONSTIPATION: "Do you have constipation?" If Yes, ask: "How bad is it?"     Not asked 7. RECURRENT SYMPTOMS: "Have you had blood in your stools before?" If Yes, ask: "When was the last time?" and "What happened that time?"      No 8. BLOOD THINNERS: "Do you take any blood thinners?" (e.g., Coumadin/warfarin, Pradaxa/dabigatran, aspirin)     No 9. OTHER SYMPTOMS: "Do you have any other symptoms?"  (e.g., abdomen pain, vomiting, dizziness, fever)     No 10. PREGNANCY: "Is there any chance you are pregnant?" "When was your last menstrual period?"       N/A  Protocols used: Rectal Bleeding-A-AH  Chief Complaint: blood in stool twice and a sore left testicle Symptoms: blood on toilet paper after BM yesterday and this morning Frequency: twice total Pertinent Negatives: Patient denies dizziness but is very tired Disposition: '[]'$ ED /'[]'$ Urgent Care (no appt availability in office) / '[x]'$ Appointment(In  office/virtual)/ '[]'$  [facility]/ '[]'$ Home Care/ '[]'$ Refused Recommended Disposition /'[]'$ [facility]/ '[]'$  Follow-up with PCP Additional Notes: Appt. Made today with Jon Billings for 11:20.

## 2022-01-17 NOTE — Progress Notes (Signed)
Wt 257 lb 4.8 oz (116.7 kg)   BMI 36.92 kg/m    Subjective:    Patient ID: Joe Gutierrez, male    DOB: 1975-08-17, 46 y.o.   MRN: [id number]  HPI: Joe Gutierrez is a 46 y.o. male  Chief Complaint  Patient presents with   Testicle Pain    Pt states he noticed pain in his L testicle last week for one day, went away. Pain came back last night. States pain comes and goes, rates pain at a 3 to 5 when he has the pain.    Rectal Bleeding    Pt states he noticed blood in his stool first thing yesterday morning. Noticed blood in the commode and on the toilet paper. States this only happened yesterday morning and then again this morning. States he has not noticed any changes in bowel habits.    TESTICULAR PAIN Pt states he noticed pain in his L testicle last week for one day, went away. Pain came back last night. States pain comes and goes, rates pain at a 3 to 5 when he has the pain.  Patient denies any pain with urination.  He does not sit a lot for work.  Denies any fevers. Not aware of a family history of prostate cancer.   It is a little bit painful to touch his testicle and feels slightly larger than the other one.    Pt states he noticed blood in his stool first thing yesterday morning. Noticed blood in the commode and on the toilet paper. States this only happened yesterday morning and then again this morning. States he has not noticed any changes in bowel habits.  He had 14 inches of his colon removed last December.  Patient states his stool was solid then more liquid which is common for him. He denies a history of hemorrhoids.      Relevant past medical, surgical, family and social history reviewed and updated as indicated. Interim medical history since our last visit reviewed. Allergies and medications reviewed and updated.  Review of Systems  Constitutional:  Negative for fever.  Gastrointestinal:  Positive for blood in stool.  Genitourinary:  Positive for testicular pain.  Negative for dysuria.    Per HPI unless specifically indicated above     Objective:    Wt 257 lb 4.8 oz (116.7 kg)   BMI 36.92 kg/m   Wt Readings from Last 3 Encounters:  01/17/22 257 lb 4.8 oz (116.7 kg)  12/30/21 257 lb 3.2 oz (116.7 kg)  12/09/21 267 lb 1.6 oz (121.2 kg)    Physical Exam Vitals and nursing note reviewed.  Constitutional:      General: He is not in acute distress.    Appearance: Normal appearance. He is not ill-appearing, toxic-appearing or diaphoretic.  HENT:     Head: Normocephalic.     Right Ear: External ear normal.     Left Ear: External ear normal.     Nose: Nose normal. No congestion or rhinorrhea.     Mouth/Throat:     Mouth: Mucous membranes are moist.  Eyes:     General:        Right eye: No discharge.        Left eye: No discharge.     Extraocular Movements: Extraocular movements intact.     Conjunctiva/sclera: Conjunctivae normal.     Pupils: Pupils are equal, round, and reactive to light.  Cardiovascular:     Rate and Rhythm: Normal rate and  regular rhythm.     Heart sounds: No murmur heard. Pulmonary:     Effort: Pulmonary effort is normal. No respiratory distress.     Breath sounds: Normal breath sounds. No wheezing, rhonchi or rales.  Abdominal:     General: Abdomen is flat. Bowel sounds are normal.  Genitourinary:    Testes:        Left: Tenderness and swelling present. Mass not present.  Musculoskeletal:     Cervical back: Normal range of motion and neck supple.  Skin:    General: Skin is warm and dry.     Capillary Refill: Capillary refill takes less than 2 seconds.  Neurological:     General: No focal deficit present.     Mental Status: He is alert and oriented to person, place, and time.  Psychiatric:        Mood and Affect: Mood normal.        Behavior: Behavior normal.        Thought Content: Thought content normal.        Judgment: Judgment normal.     Results for orders placed or performed in visit on 01/08/22   CBC with Differential/Platelet  Result Value Ref Range   WBC 11.0 (H) 3.4 - 10.8 x10E3/uL   RBC 5.02 4.14 - 5.80 x10E6/uL   Hemoglobin 14.8 13.0 - 17.7 g/dL   Hematocrit 43.8 37.5 - 51.0 %   MCV 87 79 - 97 fL   MCH 29.5 26.6 - 33.0 pg   MCHC 33.8 31.5 - 35.7 g/dL   RDW 12.7 11.6 - 15.4 %   Platelets 250 150 - 450 x10E3/uL   Neutrophils 64 Not Estab. %   Lymphs 22 Not Estab. %   Monocytes 11 Not Estab. %   Eos 2 Not Estab. %   Basos 0 Not Estab. %   Neutrophils Absolute 7.1 (H) 1.4 - 7.0 x10E3/uL   Lymphocytes Absolute 2.4 0.7 - 3.1 x10E3/uL   Monocytes Absolute 1.3 (H) 0.1 - 0.9 x10E3/uL   EOS (ABSOLUTE) 0.2 0.0 - 0.4 x10E3/uL   Basophils Absolute 0.0 0.0 - 0.2 x10E3/uL   Immature Granulocytes 1 Not Estab. %   Immature Grans (Abs) 0.1 0.0 - 0.1 x10E3/uL      Assessment & Plan:   Problem List Items Addressed This Visit   None Visit Diagnoses     Pain in left testicle    -  Primary   Ongoing x 1 day. Some swelling and pain on exam. Will order STAT US, PSA and CBC.  Will make recommendaitons based on results.   Relevant Orders   PSA   CBC w/Diff   Fecal occult blood, imunochemical   US Scrotum   Blood in stool       No hemorroids on exam. Will order Ifbot. Will make recommedations based on labs.        Follow up plan: Return if symptoms worsen or fail to improve.

## 2022-01-18 LAB — CBC WITH DIFFERENTIAL/PLATELET
Basophils Absolute: 0 10*3/uL (ref 0.0–0.2)
Basos: 0 %
EOS (ABSOLUTE): 0.2 10*3/uL (ref 0.0–0.4)
Eos: 2 %
Hematocrit: 44.3 % (ref 37.5–51.0)
Hemoglobin: 14.5 g/dL (ref 13.0–17.7)
Immature Grans (Abs): 0 10*3/uL (ref 0.0–0.1)
Immature Granulocytes: 0 %
Lymphocytes Absolute: 2.3 10*3/uL (ref 0.7–3.1)
Lymphs: 29 %
MCH: 28.4 pg (ref 26.6–33.0)
MCHC: 32.7 g/dL (ref 31.5–35.7)
MCV: 87 fL (ref 79–97)
Monocytes Absolute: 0.9 10*3/uL (ref 0.1–0.9)
Monocytes: 12 %
Neutrophils Absolute: 4.5 10*3/uL (ref 1.4–7.0)
Neutrophils: 57 %
Platelets: 248 10*3/uL (ref 150–450)
RBC: 5.11 x10E6/uL (ref 4.14–5.80)
RDW: 12.5 % (ref 11.6–15.4)
WBC: 7.9 10*3/uL (ref 3.4–10.8)

## 2022-01-18 LAB — PSA: Prostate Specific Ag, Serum: 1 ng/mL (ref 0.0–4.0)

## 2022-01-19 LAB — FECAL OCCULT BLOOD, IMMUNOCHEMICAL: Fecal Occult Bld: POSITIVE — AB

## 2022-01-19 NOTE — Progress Notes (Unsigned)
01/20/22 9:25 AM   Joe Gutierrez 1975/11/24 [id number]  Referring provider:  Venita Lick, NP [address] [place],  [place] [address]  Urological history  1. Testosterone deficiency -contributing factors of age, sleep apnea -testosterone level pending -hemoglobin/HCT (01/20/2022) 14.3/42.7 -Clomid 50 mg daily   2.  Erectile dysfunction -contributing factors of age, sleep apnea, HTN, BPH, HLD and testosterone deficiency -SHIM 16  3. Prostate cancer screening -PSA (09/2021) 1.10  4. BPH w/ LU TS -Contributing factors of age, hypertension, sleep apnea, depression, anxiety and obesity -PVR 70 mL   Chief Complaint  Patient presents with   Hypogonadism    HPI: Joe Gutierrez is a 46 y.o.male who presents today for 3 month follow-up.  At his visit on 10/07/2021, he reported that his erections had improved. He wa still having some issues with his ejaculation. His fatigue had slightly improved. He reported that he had sleep apnea and that he does not sleep with a CPAP.   He had no pain with ejaculation or blood in ejaculatory fluid.   He reported that 25% of the time he felt that he was not emptying his bladder fully but this was not accompanied by pain.  His testosterone level returned at 444.  He was then instructed to increase his Clomid 50 mg daily.  On January 17, 2022, he was seen at his PCP's office for left testicular pain and rectal bleeding.  A STAT PSA, CBC, fecal occult blood and scrotal ultrasound.  PSA was 1.0.  CBC was WNL.  Fecal occult blood was positive.  Scrotal ultrasound noted small bilateral hydroceles and small bilateral varicoceles.    He states the left testicular pain has abated.  He has an occasional issue the feeling of incomplete bladder emptying.    Patient denies any modifying or aggravating factors.  Patient denies any gross hematuria, dysuria or suprapubic/flank pain.  Patient denies any fevers, chills, nausea or vomiting.    I PSS 10/2  PVR 70  mL   IPSS     Row Name 01/20/22 0800         International Prostate Symptom Score   How often have you had the sensation of not emptying your bladder? Less than half the time     How often have you had to urinate less than every two hours? Less than half the time     How often have you found you stopped and started again several times when you urinated? Less than half the time     How often have you found it difficult to postpone urination? Not at All     How often have you had a weak urinary stream? Not at All     How often have you had to strain to start urination? Less than half the time     How many times did you typically get up at night to urinate? 2 Times     Total IPSS Score 10       Quality of Life due to urinary symptoms   If you were to spend the rest of your life with your urinary condition just the way it is now how would you feel about that? Mostly Satisfied               Score:  1-7 Mild 8-19 Moderate 20-35 Severe   SHIM 16  He still continues to have issues with low libido.  He is starting to have spontaneous erections.  He denies  any curvature or pain with erections.    SHIM     Row Name 01/20/22 0847         SHIM: Over the last 6 months:   How do you rate your confidence that you could get and keep an erection? Low     When you had erections with sexual stimulation, how often were your erections hard enough for penetration (entering your partner)? Most Times (much more than half the time)     During sexual intercourse, how often were you able to maintain your erection after you had penetrated (entered) your partner? Most Times (much more than half the time)     During sexual intercourse, how difficult was it to maintain your erection to completion of intercourse? Difficult     When you attempted sexual intercourse, how often was it satisfactory for you? Sometimes (about half the time)       SHIM Total Score   SHIM 16             Score: 1-7  Severe ED 8-11 Moderate ED 12-16 Mild-Moderate ED 17-21 Mild ED 22-25 No ED   PMH: Past Medical History:  Diagnosis Date   Anxiety    Arthritis    Colon polyp    Depression    Heart murmur    History of kidney stones    Hypertension    Obesity    Sleep apnea     Surgical History: Past Surgical History:  Procedure Laterality Date   APPENDECTOMY     CLOSED REDUCTION NASAL FRACTURE N/A 06/11/2021   Procedure: CLOSED REDUCTION NASAL FRACTURE;  Surgeon: [name], MD;  Location: [place];  Service: Plastics;  Laterality: N/A;   COLONOSCOPY WITH PROPOFOL N/A 11/16/2020   Procedure: COLONOSCOPY WITH PROPOFOL;  Surgeon: [name], MD;  Location: [facility] ENDOSCOPY;  Service: Gastroenterology;  Laterality: N/A;   LAPAROSCOPIC RIGHT COLECTOMY N/A 01/24/2021   Procedure: LAPAROSCOPIC RIGHT COLECTOMY with Otho Ket, PA-C assisting;  Surgeon: Jules Husbands, MD;  Location: [facility] ORS;  Service: General;  Laterality: N/A;   LITHOTRIPSY      Home Medications:  Allergies as of 01/20/2022       Reactions   Codeine Hives   Sulfa Antibiotics Hives        Medication List        Accurate as of January 20, 2022  9:25 AM. If you have any questions, ask your nurse or doctor.          albuterol 108 (90 Base) MCG/ACT inhaler Commonly known as: VENTOLIN HFA Inhale 2 puffs into the lungs every 6 (six) hours as needed for wheezing or shortness of breath.   buPROPion 150 MG 24 hr tablet Commonly known as: Wellbutrin XL Take 1 tablet (150 mg total) by mouth daily.   busPIRone 5 MG tablet Commonly known as: BUSPAR Take 1 tablet (5 mg total) by mouth 2 (two) times daily.   Clomid 50 MG tablet Generic drug: clomiPHENE Take 0.5 tablets (25 mg total) by mouth daily.   lisinopril 40 MG tablet Commonly known as: ZESTRIL TAKE 1 TABLET(40 MG) BY MOUTH DAILY   sertraline 100 MG tablet Commonly known as: ZOLOFT Take 2 tablets (200 mg total) by mouth daily.   Vitamin D  (Cholecalciferol) 25 MCG (1000 UT) Tabs Take 1,000 Units by mouth daily.        Allergies:  Allergies  Allergen Reactions   Codeine Hives   Sulfa Antibiotics Hives    Family  History: Family History  Problem Relation Age of Onset   Diabetes Mother    Hypertension Mother    Alcohol abuse Father    Heart disease Father    Hypertension Father    Diabetes Maternal Grandmother    Stroke Maternal Grandfather    Hypertension Brother    Asthma Daughter    Heart disease Paternal Grandfather     Social History:  reports that he has never smoked. He has never used smokeless tobacco. He reports that he does not drink alcohol and does not use drugs.   Physical Exam: BP 124/83 (BP Location: Left Arm, Patient Position: Sitting, Cuff Size: Large)   Ht '5\' 10"'$  (1.778 m)   Wt 260 lb (117.9 kg)   BMI 37.31 kg/m   Constitutional:  Well nourished. Alert and oriented, No acute distress. HEENT: [place] AT, moist mucus membranes.  Trachea midline Cardiovascular: No clubbing, cyanosis, or edema. Respiratory: Normal respiratory effort, no increased work of breathing. GU: No CVA tenderness.  No bladder fullness or masses.  Patient with uncircumcised phallus.  Foreskin easily retracted  Urethral meatus is patent.  No penile discharge. No penile lesions or rashes. Scrotum without lesions, cysts, rashes and/or edema.  Testicles are located scrotally bilaterally. No masses are appreciated in the testicles. Left and right epididymis are normal. Rectal: Patient with  normal sphincter tone. Anus and perineum without scarring or rashes. No rectal masses are appreciated. Prostate is approximately 50 + grams, could only palpate the apex and the midportion of the gland, no nodules are appreciated. Seminal vesicles could not be palpated.  Neurologic: Grossly intact, no focal deficits, moving all 4 extremities. Psychiatric: Normal mood and affect.   Laboratory Data: Component     Latest Ref Rng 12/09/2021   Hemoglobin A1C     4.8 - 5.6 % 5.7 (H)   Est. average glucose Bld gHb Est-mCnc     mg/dL 117     Legend: (H) High  Component     Latest Ref Rng 12/09/2021  TSH     0.450 - 4.500 uIU/mL 0.520    Component     Latest Ref Rng 12/09/2021  Cholesterol, Total     100 - 199 mg/dL 163   Triglycerides     0 - 149 mg/dL 108   HDL Cholesterol     >39 mg/dL 43   VLDL Cholesterol Cal     5 - 40 mg/dL 20   LDL Chol Calc (NIH)     0 - 99 mg/dL 100 (H)     Legend: (H) High     Latest Ref Rng & Units 01/20/2022    8:17 AM 01/17/2022   11:41 AM 01/08/2022    8:20 AM  CBC  WBC 3.4 - 10.8 x10E3/uL  7.9  11.0   Hemoglobin 13.0 - 17.0 g/dL 14.3  14.5  14.8   Hematocrit 39.0 - 52.0 % 42.7  44.3  43.8   Platelets 150 - 450 x10E3/uL  248  250        Latest Ref Rng & Units 01/20/2022    8:17 AM 12/09/2021    8:27 AM 10/07/2021    3:04 PM  CMP  Glucose 70 - 99 mg/dL  90    BUN 6 - 24 mg/dL  11    Creatinine 0.76 - 1.27 mg/dL  0.92    Sodium 134 - 144 mmol/L  138    Potassium 3.5 - 5.2 mmol/L  4.1    Chloride 96 -  106 mmol/L  102    CO2 20 - 29 mmol/L  21    Calcium 8.7 - 10.2 mg/dL  9.0    Total Protein 6.5 - 8.1 g/dL 6.8  6.4  6.9   Total Bilirubin 0.3 - 1.2 mg/dL 0.7  0.5  0.5   Alkaline Phos 38 - 126 U/L 71  84  53   AST 15 - 41 U/L 32  18  21   ALT 0 - 44 U/L '23  14  17     '$ Component     Latest Ref Rng 01/17/2022  Prostate Specific Ag, Serum     0.0 - 4.0 ng/mL 1.0   I have reviewed the labs.   Pertinent Imaging  01/20/22 08:48  Scan Result 35m   Assessment & Plan:    1. Testosterone deficiency - Testosterone; pending  - hemoglobin and hematocrit: normal - hepatic function panel; pending - still having issues with low libido - if testosterone is not at therapeutic levels, he would like to switch to testosterone cypionate   2.  Erectile dysfunction - slight improvement in function, but still having issues for low libido   3. Left testicular pain -pain has  abated - explained the scrotal ultrasound findings and that small bilateral hydroceles and varicoceles typically do not cause discomfort  - continue to use conservative measures  4. PSA screening - PSA stable - continue annual screening  Return for pending labs .   SRoyden Purl [facility] [address] [place] [phone]

## 2022-01-20 ENCOUNTER — Other Ambulatory Visit
Admission: RE | Admit: 2022-01-20 | Discharge: 2022-01-20 | Disposition: A | Discharge status: Home / Self Care | Payer: 59 | Attending: Urology | Admitting: Urology

## 2022-01-20 ENCOUNTER — Ambulatory Visit: Payer: 59 | Admitting: Urology

## 2022-01-20 ENCOUNTER — Encounter: Payer: Self-pay | Admitting: Nurse Practitioner

## 2022-01-20 ENCOUNTER — Encounter: Payer: Self-pay | Admitting: Urology

## 2022-01-20 VITALS — BP 124/83 | Ht 70.0 in | Wt 260.0 lb

## 2022-01-20 DIAGNOSIS — E291 Testicular hypofunction: Secondary | ICD-10-CM

## 2022-01-20 DIAGNOSIS — N529 Male erectile dysfunction, unspecified: Secondary | ICD-10-CM | POA: Insufficient documentation

## 2022-01-20 DIAGNOSIS — N138 Other obstructive and reflux uropathy: Secondary | ICD-10-CM

## 2022-01-20 DIAGNOSIS — R195 Other fecal abnormalities: Secondary | ICD-10-CM

## 2022-01-20 DIAGNOSIS — N50812 Left testicular pain: Secondary | ICD-10-CM

## 2022-01-20 DIAGNOSIS — E349 Endocrine disorder, unspecified: Secondary | ICD-10-CM

## 2022-01-20 DIAGNOSIS — N401 Enlarged prostate with lower urinary tract symptoms: Secondary | ICD-10-CM

## 2022-01-20 DIAGNOSIS — R7989 Other specified abnormal findings of blood chemistry: Secondary | ICD-10-CM | POA: Diagnosis present

## 2022-01-20 LAB — HEMOGLOBIN AND HEMATOCRIT, BLOOD
HCT: 42.7 % (ref 39.0–52.0)
Hemoglobin: 14.3 g/dL (ref 13.0–17.0)

## 2022-01-20 LAB — BLADDER SCAN AMB NON-IMAGING

## 2022-01-20 LAB — HEPATIC FUNCTION PANEL
ALT: 23 U/L (ref 0–44)
AST: 32 U/L (ref 15–41)
Albumin: 4 g/dL (ref 3.5–5.0)
Alkaline Phosphatase: 71 U/L (ref 38–126)
Bilirubin, Direct: <0.1 mg/dL (ref 0.0–0.2)
Total Bilirubin: 0.7 mg/dL (ref 0.3–1.2)
Total Protein: 6.8 g/dL (ref 6.5–8.1)

## 2022-01-20 NOTE — Progress Notes (Signed)
Please let patient know that his complete blood count was normal.  No evidence of infection.  His Prostate cancer screening was also normal.  His stool test was positive.  He will need to see GI for further evaluation of the blood in his stool.

## 2022-01-21 LAB — TESTOSTERONE: Testosterone: 248 ng/dL — ABNORMAL LOW (ref 264–916)

## 2022-01-22 ENCOUNTER — Other Ambulatory Visit: Payer: Self-pay | Admitting: Urology

## 2022-01-22 ENCOUNTER — Telehealth: Payer: Self-pay

## 2022-01-22 DIAGNOSIS — E349 Endocrine disorder, unspecified: Secondary | ICD-10-CM

## 2022-01-22 MED ORDER — TESTOSTERONE CYPIONATE 200 MG/ML IM SOLN: 200.0000 mg | INTRAMUSCULAR | 0 refills | Status: DC

## 2022-01-22 NOTE — Telephone Encounter (Signed)
Pt states he is in agreement with this plan. Injection teaching appt booked for Monday 01/27/22 in [place].

## 2022-01-22 NOTE — Telephone Encounter (Signed)
-----   Message from Nori Riis, PA-C sent at 01/22/2022  8:04 AM EST ----- Please let Mr. Repass know that his testosterone level returned subtherapeutic.  I recommend that we change to testosterone cypionate as we discussed at his appointment.  Is he still in agreement?  If so, [facility] will [facility] [place] and we will schedule him for an appointment so that I can instruct him on injections.

## 2022-01-24 NOTE — Progress Notes (Unsigned)
Joe Gutierrez is a 46 y.o.  male with testosterone deficiency who presents today for instruction on delivering an IM injection of testosterone cypionate.    I instructed the patient to identify the concentration of his testosterone. Testosterone for injection is usually in the form of testosterone cypionate. These liquids come in multiple concentrations, so before giving an injection, it's very important to make sure that his intended dosage takes into account the concentration of the testosterone serum. Usually, testosterone comes in a concentration of either 100 mg/ml or 200 mg/ml.  We typically use the 200 mg/mL in this office.    Using a sterile, 18 G needle and 3 cc syringe, the testosterone cypionate was drawn up for a ***  cc injection.  To draw up the dose, I demonstrated how to first draw air into the syringe equal to the volume of the dosage. Then, wipe the top of the medication bottle with an alcohol wipe, insert the needle through the lid and into the medication, and push the air from your syringe into the bottle. Turn the bottle upside down and draw out the exact dosage of testosterone.  I demonstrated how to aspirate the syringe by hinge the syringe with its needle uncapped and pointing up in front of him.  Looking for air bubbles in the syringe. Flick the side of the syringe to get these bubbles to rise to the top.    When the dosage is bubble-free, I slowly depressed the plunger to force the air at the top of the syringe out stopping when a tiny drop of medication comes out of the tip of the syringe.  I advised him to be certain no air remained in the syringe as injecting air is very dangerous.  Being careful not to squirt or spray a significant portion of the dosage onto the floor.  Preparing the injection site, outer middle third of the vastus lateralis muscle of the thigh, I took a sterile alcohol pad and wipe the immediate area around where I intended him to inject.   I then  demonstrated how to change the needle from the 18 G to the 21 G needle.  I then gave him the syringe for injecting.  He correctly identified the injection location.  He held the syringe like a dart at a 90-degree angle above the sterile injection site. Quickly plunged it into the flesh. Before depressing the plunger, he drew back on it slightly and no blood was seen.  I advised him that if blood flashed in the syringe, he would need to remove the needle and then select a different location as he was in the vein.  Inject the medication at a steady, controlled pace.  He fully depressed the plunger, slowly pull the needle out. Pressing around the injection site with a sterile cotton swab as he did so - this preventing the emerging needle from pulling on the skin and causing extra pain.  We assessed the needle entry point for bleeding, and applied a sterile Band-Aid and/or cotton swab if needed. Disposed of the used needle and syringe in a proper sharps container.  I advised him to acquire a sharps container for his personal use at home.  I advised him that If, after injection, he experienced redness, swelling, or discomfort beyond that of normal soreness at the site of injection, call our office for an appointment and instructions.  He is to always store his medication at the recommended temperature, and always check the expiration date  on the bottle. If it's expired, don't use it.  Of course, keep all of med's out of reach of children.  Do not change his dose without consulting your provider.  His starting dose will be 1 cc every 2 weeks.  He will return 1 week after his fourth injection for a testosterone level  Joe Kimmey, PA-C

## 2022-01-27 ENCOUNTER — Ambulatory Visit (INDEPENDENT_AMBULATORY_CARE_PROVIDER_SITE_OTHER): Payer: 59 | Admitting: Urology

## 2022-01-27 ENCOUNTER — Encounter: Payer: Self-pay | Admitting: Urology

## 2022-01-27 VITALS — BP 111/76 | HR 77 | Ht 70.0 in | Wt 260.0 lb

## 2022-01-27 DIAGNOSIS — E291 Testicular hypofunction: Secondary | ICD-10-CM

## 2022-01-27 DIAGNOSIS — E349 Endocrine disorder, unspecified: Secondary | ICD-10-CM

## 2022-01-27 MED ORDER — "BD DISP NEEDLES 18G X 1-1/2"" MISC": 1.0000 mg | 0 refills | Status: DC

## 2022-01-27 MED ORDER — SYRINGE 2-3 ML 3 ML MISC: 1.0000 mg | 3 refills | Status: AC

## 2022-01-27 MED ORDER — TESTOSTERONE CYPIONATE 200 MG/ML IM SOLN: 200.0000 mg | INTRAMUSCULAR | 0 refills | Status: DC

## 2022-01-27 MED ORDER — "BD DISP NEEDLES 21G X 1-1/2"" MISC": 1.0000 mg | 0 refills | Status: AC

## 2022-03-31 ENCOUNTER — Other Ambulatory Visit: Payer: Self-pay

## 2022-03-31 ENCOUNTER — Other Ambulatory Visit
Admission: RE | Admit: 2022-03-31 | Discharge: 2022-03-31 | Disposition: A | Discharge status: Home / Self Care | Payer: BLUE CROSS/BLUE SHIELD | Attending: Urology | Admitting: Urology

## 2022-03-31 ENCOUNTER — Ambulatory Visit: Payer: 59 | Admitting: Urology

## 2022-03-31 DIAGNOSIS — E349 Endocrine disorder, unspecified: Secondary | ICD-10-CM | POA: Diagnosis present

## 2022-03-31 LAB — HEMOGLOBIN AND HEMATOCRIT, BLOOD
HCT: 47.1 % (ref 39.0–52.0)
Hemoglobin: 15.6 g/dL (ref 13.0–17.0)

## 2022-04-01 ENCOUNTER — Telehealth: Payer: Self-pay | Admitting: Family Medicine

## 2022-04-01 LAB — TESTOSTERONE: Testosterone: 983 ng/dL — ABNORMAL HIGH (ref 264–916)

## 2022-04-01 NOTE — Telephone Encounter (Signed)
-----   Message from Nori Riis, PA-C sent at 04/01/2022  8:57 AM EST ----- Please let Joe Gutierrez know that his blood work demonstrated that his testosterone is within therapeutic levels.  We will need the testosterone (one week after injection), hemoglobin and hematocrit repeated in three months.

## 2022-04-01 NOTE — Telephone Encounter (Signed)
Patient notified and voiced understanding. Lab appointment has been made.

## 2022-04-11 ENCOUNTER — Other Ambulatory Visit: Payer: Self-pay | Admitting: Nurse Practitioner

## 2022-04-11 NOTE — Telephone Encounter (Signed)
[facility] and verified with Pharmacist Berneta Sages that no refills are left on this prescription. Requested Prescriptions  Pending Prescriptions Disp Refills   albuterol (VENTOLIN HFA) 108 (90 Base) MCG/ACT inhaler [Pharmacy Med Name: ALBUTEROL HFA (PROVENTIL) INH] 8 g 3    Sig: INHALE 2 PUFFS BY MOUTH EVERY 6 HOURS AS NEEDED FOR WHEEZE OR SHORTNESS OF BREATH     Pulmonology:  Beta Agonists 2 Passed - 04/11/2022  1:44 AM      Passed - Last BP in normal range    BP Readings from Last 1 Encounters:  01/27/22 111/76         Passed - Last Heart Rate in normal range    Pulse Readings from Last 1 Encounters:  01/27/22 77         Passed - Valid encounter within last 12 months    Recent Outpatient Visits           2 months ago Pain in left testicle   [place], Karen, NP   3 months ago Acute cough   [place], [place] T, NP   4 months ago Essential hypertension, benign   [place], [place] T, NP   7 months ago Acute pain of right shoulder   [place], Henrine Screws T, NP   9 months ago Depression with anxiety   [place], Barbaraann Faster, NP       Future Appointments             In 1 month [name], MD [facility] [place]   In 2 months [place], Barbaraann Faster, NP [place]

## 2022-04-30 ENCOUNTER — Telehealth: Payer: Self-pay | Admitting: Urology

## 2022-04-30 NOTE — Telephone Encounter (Signed)
Yes, as soon as we get his new insurance information we can do a prior [place] .

## 2022-04-30 NOTE — Telephone Encounter (Signed)
Patient called and stated that he has new [employer] and that his pharmacy said insurance would need to approve his testosterone prescription. He said the pharmacy had sent over request to [place]. I didn't see new insurance in his chart, so he is uploading it to his mychart.

## 2022-05-19 ENCOUNTER — Ambulatory Visit: Payer: BLUE CROSS/BLUE SHIELD | Admitting: Gastroenterology

## 2022-05-19 ENCOUNTER — Encounter: Payer: Self-pay | Admitting: Gastroenterology

## 2022-05-19 ENCOUNTER — Other Ambulatory Visit: Payer: Self-pay

## 2022-05-19 VITALS — BP 123/86 | HR 66 | Temp 98.7°F | Ht 70.0 in | Wt 260.0 lb

## 2022-05-19 DIAGNOSIS — R195 Other fecal abnormalities: Secondary | ICD-10-CM

## 2022-05-19 MED ORDER — NA SULFATE-K SULFATE-MG SULF 17.5-3.13-1.6 GM/177ML PO SOLN: 354.0000 mL | Freq: Once | ORAL | 0 refills | Status: AC

## 2022-05-19 NOTE — Addendum Note (Signed)
Addended by: Wayna Chalet on: 05/19/2022 01:26 PM   Modules accepted: Orders

## 2022-05-19 NOTE — Progress Notes (Signed)
Joe Bellows MD, MRCP(U.K) [address]  [place]  [place], [place] [address]  Main: [phone]  Fax: [phone]   Gastroenterology Consultation  Referring Provider:     Jon Billings, NP Primary Care Physician:  Joe Lick, NP Primary Gastroenterologist:  Dr. Jonathon Gutierrez  Reason for Consultation:     FIT test positive         HPI:   Joe Gutierrez is Gutierrez 47 y.o. y/o male referred for consultation & management  by Dr. Venita Lick, NP.     He has been referred for Gutierrez positive FIT test. Last colonoscopy in 2022 showed no pre cancerous polyps but Gutierrez polypoid lesion at the cecum was noted and the patient underwent Gutierrez rt hemicolectomy : pathology showed Gutierrez benign lesion.    Unclear reason for checking stool for blood as he is not due for colon cancer screening .   He has been having some left upper quadrant pain ongoing for Gutierrez week associated after meals and having some loose stools he runs 3 to 4-sodas Gutierrez day diet.  Complains of gas and bloating as well.  He has noticed some blood on the tissue paper when he wipes excessively but no other complaint. Past Medical History:  Diagnosis Date   Anxiety    Arthritis    Colon polyp    Depression    Heart murmur    History of kidney stones    Hypertension    Hypogonadism in male    Obesity    Sleep apnea     Past Surgical History:  Procedure Laterality Date   APPENDECTOMY     CLOSED REDUCTION NASAL FRACTURE N/Gutierrez 06/11/2021   Procedure: CLOSED REDUCTION NASAL FRACTURE;  Surgeon: Joe Presume, MD;  Location: [place];  Service: Plastics;  Laterality: N/Gutierrez;   COLONOSCOPY WITH PROPOFOL N/Gutierrez 11/16/2020   Procedure: COLONOSCOPY WITH PROPOFOL;  Surgeon: Joe Bellows, MD;  Location: [facility] ENDOSCOPY;  Service: Gastroenterology;  Laterality: N/Gutierrez;   LAPAROSCOPIC RIGHT COLECTOMY N/Gutierrez 01/24/2021   Procedure: LAPAROSCOPIC RIGHT COLECTOMY with Joe Ket, PA-C assisting;  Surgeon: Joe Husbands, MD;  Location: [facility];  Service:  General;  Laterality: N/Gutierrez;   LITHOTRIPSY      Prior to Admission medications   Medication Sig Start Date End Date Taking? Authorizing Provider  albuterol (VENTOLIN HFA) 108 (90 Base) MCG/ACT inhaler INHALE 2 PUFFS BY MOUTH EVERY 6 HOURS AS NEEDED FOR WHEEZE OR SHORTNESS OF BREATH 04/11/22   Gutierrez, Joe T, NP  buPROPion (WELLBUTRIN XL) 150 MG 24 hr tablet Take 1 tablet (150 mg total) by mouth daily. 12/09/21   Gutierrez, Joe Screws T, NP  busPIRone (BUSPAR) 5 MG tablet Take 1 tablet (5 mg total) by mouth 2 (two) times daily. 12/09/21   Gutierrez, Joe Screws T, NP  lisinopril (ZESTRIL) 40 MG tablet TAKE 1 TABLET(40 MG) BY MOUTH DAILY 12/09/21   Gutierrez, Joe T, NP  NEEDLE, DISP, 18 G (BD DISP NEEDLES) 18G X 1-1/2" MISC 1 mg by Does not apply route every 14 (fourteen) days. 01/27/22   Joe Council A, PA-C  NEEDLE, DISP, 21 G (BD DISP NEEDLES) 21G X 1-1/2" MISC 1 mg by Does not apply route every 14 (fourteen) days. 01/27/22   Joe Council A, PA-C  sertraline (ZOLOFT) 100 MG tablet Take 2 tablets (200 mg total) by mouth daily. 12/09/21   Gutierrez, Joe Screws T, NP  Syringe, Disposable, (2-3CC SYRINGE) 3 ML MISC 1 mg by Does not apply route  every 14 (fourteen) days. 01/27/22   Joe Council A, PA-C  testosterone cypionate (DEPOTESTOSTERONE CYPIONATE) 200 MG/ML injection Inject 1 mL (200 mg total) into the muscle every 14 (fourteen) days. 01/27/22   Joe Council A, PA-C  Vitamin D, Cholecalciferol, 25 MCG (1000 UT) TABS Take 1,000 Units by mouth daily.    [provider]    Family History  Problem Relation Age of Onset   Diabetes Mother    Hypertension Mother    Alcohol abuse Father    Heart disease Father    Hypertension Father    Diabetes Maternal Grandmother    Stroke Maternal Grandfather    Hypertension Brother    Asthma Daughter    Heart disease Paternal Grandfather      Social History   Tobacco Use   Smoking status: Never   Smokeless tobacco: Never  Vaping Use    Vaping Use: Never used  Substance Use Topics   Alcohol use: No   Drug use: No    Allergies as of 05/19/2022 - Review Complete 05/19/2022  Allergen Reaction Noted   Codeine Hives 01/24/2015   Sulfa antibiotics Hives 01/24/2015    Review of Systems:    All systems reviewed and negative except where noted in HPI.   Physical Exam:  BP 123/86   Pulse 66   Temp 98.7 F (37.1 C) (Oral)   Ht 5\' 10"  (1.778 m)   Wt 260 lb (117.9 kg)   BMI 37.31 kg/m  No LMP for male patient. Psych:  Alert and cooperative. Normal mood and affect. General:   Alert,  Well-developed, well-nourished, pleasant and cooperative in NAD Head:  Normocephalic and atraumatic. Eyes:  Sclera clear, no icterus.   Conjunctiva pink. Ears:  Normal auditory acuity. Abdomen:  Normal bowel sounds.  No bruits.  Soft, non-tender and non-distended without masses, hepatosplenomegaly or hernias noted.  No guarding or rebound tenderness.    Neurologic:  Alert and oriented x3;  grossly normal neurologically. Psych:  Alert and cooperative. Normal mood and affect.  Imaging Studies: No results found.  Assessment and Plan:   Joe Gutierrez is Gutierrez 47 y.o. y/o male has been referred for stool test that was positive. Explained that its unclear reason for checking stool for blood as it is only indicated for colon cancer screening and he is not due till 2032 , unfortunately since checked and positive needs Gutierrez colonoscopy . Explained that in future he shouldn't have any stool tested for any reason except for colon cancer screening as the test is not indicated for any other reason.  Will proceed with Gutierrez colonoscopy .  He has had some left upper quadrant discomfort which I attribute to consumption of large quantity of diet soda which have advised him to hold off if the symptoms do not resolve he should let me know and we can perform an upper endoscopy at the same time.  Blood on the tissue paper likely due to hemorrhoids which we will also inspect  during his colonoscopy and he will be provided patient information on management of hemorrhoids.  I have also discussed about conservative management of hemorrhoids in his case   I have discussed alternative options, risks & benefits,  which include, but are not limited to, bleeding, infection, perforation,respiratory complication & drug reaction.  The patient agrees with this plan & written consent will be obtained.      Follow up in as needed  Dr Joe Bellows MD,MRCP(U.K)

## 2022-05-19 NOTE — Patient Instructions (Addendum)
Please give [place] a call to let us know if we need to add an upper endoscopy the day of your colonoscopy.  Hemorrhoids Hemorrhoids are swollen veins that may form: In the butt (rectum). These are called internal hemorrhoids. Around the opening of the butt (anus). These are called external hemorrhoids. Most hemorrhoids do not cause very bad problems. They often get better with changes to your lifestyle and what you eat. What are the causes? Having trouble pooping (constipation) or watery poop (diarrhea). Pushing too hard when you poop. Pregnancy. Being very overweight (obese). Sitting for too long. Riding a bike for a long time. Heavy lifting or other things that take a lot of effort. Anal sex. What are the signs or symptoms? Pain. Itching or soreness in the butt. Bleeding from the butt. Leaking poop. Swelling. One or more lumps around the opening of your butt. How is this treated? In most cases, hemorrhoids can be treated at home. You may be told to: Change what you eat. Make changes to your lifestyle. If these treatments do not help, you may need to have a procedure done. Your doctor may need to: Place rubber bands at the bottom of the hemorrhoids to make them fall off. Put medicine into the hemorrhoids to shrink them. Shine a type of light on the hemorrhoids to cause them to fall off. Do surgery to get rid of the hemorrhoids. Follow these instructions at home: Medicines Take over-the-counter and prescription medicines only as told by your doctor. Use creams with medicine in them or medicines that you put in your butt as told by your doctor. Eating and drinking  Eat foods that have a lot of fiber in them. These include whole grains, beans, nuts, fruits, and vegetables. Ask your doctor about taking products that have fiber added to them (fibersupplements). Take in less fat. You can do this by: Eating low-fat dairy products. Eating less red meat. Staying away from processed  foods. Drink enough fluid to keep your pee (urine) pale yellow. Managing pain and swelling  Take a warm-water bath (sitz bath) for 20 minutes to ease pain. Do this 3-4 times a day. You may do this in a bathtub. You may also use a portable sitz bath that fits over the toilet. If told, put ice on the painful area. It may help to use ice between your warm baths. Put ice in a plastic bag. Place a towel between your skin and the bag. Leave the ice on for 20 minutes, 2-3 times a day. If your skin turns bright red, take off the ice right away to prevent skin damage. The risk of damage is higher if you cannot feel pain, heat, or cold. General instructions Exercise. Ask your doctor how much and what kind of exercise is best for you. Go to the bathroom when you need to poop. Do not wait. Try not to push too hard when you poop. Keep your butt dry and clean. Use wet toilet paper or moist towelettes after you poop. Do not sit on the toilet for a long time. Contact a doctor if: You have pain and swelling that do not get better with treatment. You have trouble pooping. You cannot poop. You have pain or swelling outside the area of the hemorrhoids. Get help right away if: You have bleeding from the butt that will not stop. This information is not intended to replace advice given to you by your health care provider. Make sure you discuss any questions you have with  your health care provider. Document Revised: 10/16/2021 Document Reviewed: 10/16/2021 Elsevier Patient Education  [employer].

## 2022-05-20 ENCOUNTER — Telehealth: Payer: Self-pay

## 2022-05-20 NOTE — Telephone Encounter (Addendum)
Per pt he is aware that he is out of network. Pt would like to keep appointment.   PT IS AWARE HE'S OUT OF NETWORK. PT DOESN'T WISH TO CANCEL!

## 2022-06-08 NOTE — Patient Instructions (Signed)
Managing Anxiety, Adult After being diagnosed with anxiety, you may be relieved to know why you have felt or behaved a certain way. You may also feel overwhelmed about the treatment ahead and what it will mean for your life. With care and support, you can manage your anxiety. How to manage lifestyle changes Understanding the difference between stress and anxiety Although stress can play a role in anxiety, it is not the same as anxiety. Stress is your body's reaction to life changes and events, both good and bad. Stress is often caused by something external, such as a deadline, test, or competition. It normally goes away after the event has ended and will last just a few hours. But, stress can be ongoing and can lead to more than just stress. Anxiety is caused by something internal, such as imagining a terrible outcome or worrying that something will go wrong that will greatly upset you. Anxiety often does not go away even after the event is over, and it can become a long-term (chronic) worry. Lowering stress and anxiety Talk with your health care provider or a counselor to learn more about lowering anxiety and stress. They may suggest tension-reduction techniques, such as: Music. Spend time creating or listening to music that you enjoy and that inspires you. Mindfulness-based meditation. Practice being aware of your normal breaths while not trying to control your breathing. It can be done while sitting or walking. Centering prayer. Focus on a word, phrase, or sacred image that means something to you and brings you peace. Deep breathing. Expand your stomach and inhale slowly through your nose. Hold your breath for 3-5 seconds. Then breathe out slowly, letting your stomach muscles relax. Self-talk. Learn to notice and spot thought patterns that lead to anxiety reactions. Change those patterns to thoughts that feel peaceful. Muscle relaxation. Take time to tense muscles and then relax them. Choose a  tension-reduction technique that fits your lifestyle and personality. These techniques take time and practice. Set aside 5-15 minutes a day to do them. Specialized therapists can offer counseling and training in these techniques. The training to help with anxiety may be covered by some insurance plans. Other things you can do to manage stress and anxiety include: Keeping a stress diary. This can help you learn what triggers your reaction and then learn ways to manage your response. Thinking about how you react to certain situations. You may not be able to control everything, but you can control your response. Making time for activities that help you relax and not feeling guilty about spending your time in this way. Doing visual imagery. This involves imagining or creating mental pictures to help you relax. Practicing yoga. Through yoga poses, you can lower tension and relax.  Medicines Medicines for anxiety include: Antidepressant medicines. These are usually prescribed for long-term daily control. Anti-anxiety medicines. These may be added in severe cases, especially when panic attacks occur. When used together, medicines, psychotherapy, and tension-reduction techniques may be the most effective treatment. Relationships Relationships can play a big part in helping you recover. Spend more time connecting with trusted friends and family members. Think about going to couples counseling if you have a partner, taking family education classes, or going to family therapy. Therapy can help you and others better understand your anxiety. How to recognize changes in your anxiety Everyone responds differently to treatment for anxiety. Recovery from anxiety happens when symptoms lessen and stop interfering with your daily life at home or work. This may mean that you   will start to: Have better concentration and focus. Worry will interfere less in your daily thinking. Sleep better. Be less irritable. Have more  energy. Have improved memory. Try to recognize when your condition is getting worse. Contact your provider if your symptoms interfere with home or work and you feel like your condition is not improving. Follow these instructions at home: Activity Exercise. Adults should: Exercise for at least 150 minutes each week. The exercise should increase your heart rate and make you sweat (moderate-intensity exercise). Do strengthening exercises at least twice a week. Get the right amount and quality of sleep. Most adults need 7-9 hours of sleep each night. Lifestyle  Eat a healthy diet that includes plenty of vegetables, fruits, whole grains, low-fat dairy products, and lean protein. Do not eat a lot of foods that are high in fats, added sugars, or salt (sodium). Make choices that simplify your life. Do not use any products that contain nicotine or tobacco. These products include cigarettes, chewing tobacco, and vaping devices, such as e-cigarettes. If you need help quitting, ask your provider. Avoid caffeine, alcohol, and certain over-the-counter cold medicines. These may make you feel worse. Ask your pharmacist which medicines to avoid. General instructions Take over-the-counter and prescription medicines only as told by your provider. Keep all follow-up visits. This is to make sure you are managing your anxiety well or if you need more support. Where to find support You can get help and support from: Self-help groups. Online and community organizations. A trusted spiritual leader. Couples counseling. Family education classes. Family therapy. Where to find more information You may find that joining a support group helps you deal with your anxiety. The following sources can help you find counselors or support groups near you: Mental Health America: mentalhealthamerica.net Anxiety and Depression Association of America (ADAA): adaa.org National Alliance on Mental Illness (NAMI): nami.org Contact  a health care provider if: You have a hard time staying focused or finishing tasks. You spend many hours a day feeling worried about everyday life. You are very tired because you cannot stop worrying. You start to have headaches or often feel tense. You have chronic nausea or diarrhea. Get help right away if: Your heart feels like it is racing. You have shortness of breath. You have thoughts of hurting yourself or others. Get help right away if you feel like you may hurt yourself or others, or have thoughts about taking your own life. Go to your nearest emergency room or: Call 911. Call the National Suicide Prevention Lifeline at 1-800-273-8255 or 988. This is open 24 hours a day. Text the Crisis Text Line at 741741. This information is not intended to replace advice given to you by your health care provider. Make sure you discuss any questions you have with your health care provider. Document Revised: 11/12/2021 Document Reviewed: 05/27/2020 Elsevier Patient Education  2023 Elsevier Inc.  

## 2022-06-09 ENCOUNTER — Encounter: Payer: Self-pay | Admitting: Gastroenterology

## 2022-06-09 ENCOUNTER — Other Ambulatory Visit: Payer: Self-pay

## 2022-06-09 ENCOUNTER — Ambulatory Visit: Payer: BLUE CROSS/BLUE SHIELD | Admitting: Nurse Practitioner

## 2022-06-09 ENCOUNTER — Encounter: Payer: Self-pay | Admitting: Nurse Practitioner

## 2022-06-09 ENCOUNTER — Telehealth: Payer: Self-pay

## 2022-06-09 VITALS — BP 135/88 | HR 60 | Temp 98.1°F | Ht 70.0 in | Wt 257.2 lb

## 2022-06-09 DIAGNOSIS — Z6835 Body mass index (BMI) 35.0-35.9, adult: Secondary | ICD-10-CM

## 2022-06-09 DIAGNOSIS — K429 Umbilical hernia without obstruction or gangrene: Secondary | ICD-10-CM | POA: Insufficient documentation

## 2022-06-09 DIAGNOSIS — R1013 Epigastric pain: Secondary | ICD-10-CM

## 2022-06-09 DIAGNOSIS — E7800 Pure hypercholesterolemia, unspecified: Secondary | ICD-10-CM | POA: Diagnosis not present

## 2022-06-09 DIAGNOSIS — R1084 Generalized abdominal pain: Secondary | ICD-10-CM | POA: Insufficient documentation

## 2022-06-09 DIAGNOSIS — I10 Essential (primary) hypertension: Secondary | ICD-10-CM | POA: Diagnosis not present

## 2022-06-09 DIAGNOSIS — F418 Other specified anxiety disorders: Secondary | ICD-10-CM | POA: Diagnosis not present

## 2022-06-09 DIAGNOSIS — R109 Unspecified abdominal pain: Secondary | ICD-10-CM | POA: Insufficient documentation

## 2022-06-09 DIAGNOSIS — R7301 Impaired fasting glucose: Secondary | ICD-10-CM | POA: Diagnosis not present

## 2022-06-09 DIAGNOSIS — E6609 Other obesity due to excess calories: Secondary | ICD-10-CM

## 2022-06-09 DIAGNOSIS — G4733 Obstructive sleep apnea (adult) (pediatric): Secondary | ICD-10-CM

## 2022-06-09 NOTE — Assessment & Plan Note (Signed)
Noted on past labs, check lipid panel today and CMP.  ASCVD 2.5%, diet focus at this time. 

## 2022-06-09 NOTE — Progress Notes (Signed)
BP 135/88   Pulse 60   Temp 98.1 F (36.7 C) (Oral)   Ht  (1.778 m)   Wt 257 lb 3.2 oz (116.7 kg)   SpO2 96%   BMI 36.90 kg/m    Subjective:    Patient ID: Joe Gutierrez, male    DOB: 1975/04/22, 47 y.o.   MRN: [id number]  HPI: TYE Gutierrez is a 47 y.o. male  Chief Complaint  Patient presents with   Hyperlipidemia   Mood   Abdominal Pain    Has been having some stomach pain mostly after he eats or is he becomes active   HYPERTENSION Continues on Lisinopril 40 MG daily. No current statin.  Last A1c remained in prediabetic range at 5.7%. Hypertension status: stable  Satisfied with current treatment? yes Duration of hypertension: chronic BP monitoring frequency: not checking BP range: BP medication side effects:  no Medication compliance: good compliance Aspirin: no Recurrent headaches: no Visual changes: no Palpitations: no Dyspnea: no Chest pain: no Lower extremity edema: no Dizzy/lightheaded: no The 10-year ASCVD risk score (Arnett DK, et al., 2019) is: 2.5%   Values used to calculate the score:     Age: 13 years     Sex: Male     Is Non-Hispanic African American: No     Diabetic: No     Tobacco smoker: No     Systolic Blood Pressure: 135 mmHg     Is BP treated: Yes     HDL Cholesterol: 43 mg/dL     Total Cholesterol: 163 mg/dL  ABDOMINAL PAIN  Saw GI on 05/19/22 for rectal bleeding -- he was recommended to stop drinking sodas, but still having pain.  Is scheduled for colonoscopy on 06/16/22. Reports he has been having abdominal pain mostly after eating or if he becomes active.  Currently is not taking anything for heart burn. Duration:weeks Onset: gradual Severity: 2/10 -- at times is a 5-6 Quality: cramping and throbbing, burning Location:  LUQ, RUQ, and epigastric  Episode duration:  Radiation: no Frequency: intermittent Alleviating factors: nothing Aggravating factors: after you eating and activity Status: fluctuating Treatments  attempted: TUMS Fever: no Nausea: no Vomiting: no Weight loss: no Decreased appetite: no Diarrhea: yes -- 10 minutes after eating it presents Constipation: no Blood in stool:  none recent Heartburn: yes Jaundice: no Rash: no Dysuria/urinary frequency: no Hematuria: no History of sexually transmitted disease: no Recurrent NSAID use: no   DEPRESSION Continues on Sertraline 200 MG daily, Wellbutrin XL 150 MG daily, + Buspar 5 BID.   Mood status: stable Satisfied with current treatment?: yes Symptom severity: mild  Duration of current treatment : chronic Side effects: no Medication compliance: good compliance Psychotherapy/counseling: none Previous psychiatric medications: Paxil and Buspar Depressed mood: no Anxious mood: no Anhedonia: no Significant weight loss or gain: no Insomnia: yes, occasional Fatigue: occasional Feelings of worthlessness or guilt: no Impaired concentration/indecisiveness: no Suicidal ideations: no Hopelessness: no Crying spells: no     06/09/2022    3:52 PM 01/17/2022   11:24 AM 12/09/2021    8:14 AM 08/29/2021   10:04 AM 07/10/2021    9:10 AM  Depression screen PHQ 2/9  Decreased Interest 0 Down, Depressed, Hopeless 0 2 0 0 1  PHQ - 2 Score 0 Altered sleeping Tired, decreased energy Change in appetite 0 0 0 1 2  Feeling  bad or failure about yourself  0 0 0 0 1  Trouble concentrating 1 1 1 1 1   Moving slowly or fidgety/restless 0 0 0 0 1  Suicidal thoughts 0 0  0 0  PHQ-9 Score 3 8 4 5 11   Difficult doing work/chores Not difficult at all Somewhat difficult Not difficult at all Not difficult at all        06/09/2022    3:53 PM 01/17/2022   11:24 AM 12/09/2021    8:15 AM 08/29/2021   10:04 AM  GAD 7 : Generalized Anxiety Score  Nervous, Anxious, on Edge 1 2 1 1   Control/stop worrying 0 2 0 0  Worry too much - different things 1 1 0 0  Trouble relaxing 0 1 1 1   Restless 0 1 1 0  Easily annoyed or  irritable 1 1 0 1  Afraid - awful might happen 0 0 0 0  Total GAD 7 Score 3 8 3 3   Anxiety Difficulty Not difficult at all Somewhat difficult Not difficult at all Not difficult at all   Relevant past medical, surgical, family and social history reviewed and updated as indicated. Interim medical history since our last visit reviewed. Allergies and medications reviewed and updated.  Review of Systems  Constitutional:  Negative for activity change, diaphoresis, fatigue and fever.  Respiratory:  Negative for cough, chest tightness, shortness of breath and wheezing.   Cardiovascular:  Negative for chest pain, palpitations and leg swelling.  Gastrointestinal:  Positive for abdominal pain and diarrhea. Negative for abdominal distention, blood in stool, constipation, nausea, rectal pain and vomiting.  Endocrine: Negative.   Neurological: Negative.   Psychiatric/Behavioral:  Negative for decreased concentration, self-injury, sleep disturbance and suicidal ideas. The patient is not nervous/anxious.    Per HPI unless specifically indicated above     Objective:    BP 135/88   Pulse 60   Temp 98.1 F (36.7 C) (Oral)   Ht 5\' 10"  (1.778 m)   Wt 257 lb 3.2 oz (116.7 kg)   SpO2 96%   BMI 36.90 kg/m   Wt Readings from Last 3 Encounters:  06/09/22 257 lb 3.2 oz (116.7 kg)  05/19/22 260 lb (117.9 kg)  01/27/22 260 lb (117.9 kg)    Physical Exam Vitals and nursing note reviewed.  Constitutional:      General: He is awake. He is not in acute distress.    Appearance: He is well-developed, well-groomed and overweight. He is not ill-appearing.  HENT:     Head: Normocephalic and atraumatic.     Right Ear: Hearing normal. No drainage.     Left Ear: Hearing normal. No drainage.  Eyes:     General: Lids are normal.        Right eye: No discharge.        Left eye: No discharge.     Conjunctiva/sclera: Conjunctivae normal.     Pupils: Pupils are equal, round, and reactive to light.  Neck:      Vascular: No carotid bruit.  Cardiovascular:     Rate and Rhythm: Normal rate and regular rhythm.     Heart sounds: Normal heart sounds, S1 normal and S2 normal. No murmur heard.    No gallop.  Pulmonary:     Effort: Pulmonary effort is normal. No accessory muscle usage or respiratory distress.     Breath sounds: Normal breath sounds.  Abdominal:     General: Bowel sounds are normal. There is no distension.  Palpations: Abdomen is soft.     Tenderness: There is no abdominal tenderness.     Hernia: No hernia is present.  Musculoskeletal:     Cervical back: Normal range of motion and neck supple.     Right knee: Normal.     Left knee: Normal.     Right lower leg: No edema.     Left lower leg: No edema.  Skin:    General: Skin is warm and dry.  Neurological:     Mental Status: He is alert and oriented to person, place, and time.     Deep Tendon Reflexes: Reflexes are normal and symmetric.     Reflex Scores:      Brachioradialis reflexes are 2+ on the right side and 2+ on the left side.      Patellar reflexes are 2+ on the right side and 2+ on the left side. Psychiatric:        Attention and Perception: Attention normal.        Mood and Affect: Mood normal.        Speech: Speech normal.        Behavior: Behavior normal. Behavior is cooperative.    Results for orders placed or performed during the hospital encounter of 03/31/22  Hemoglobin and hematocrit, blood  Result Value Ref Range   Hemoglobin 15.6 13.0 - 17.0 g/dL   HCT 40.9 81.1 - 91.4 %  Testosterone  Result Value Ref Range   Testosterone 983 (H) 264 - 916 ng/dL      Assessment & Plan:   Problem List Items Addressed This Visit       Cardiovascular and Mediastinum   Essential hypertension, benign    Chronic, stable.  BP well below goal today. Continue Lisinopril 40 MG at this time and if elevations above goal in future consider addition of Amlodipine.  Recommend he continue to monitor BP at home and document +  focus on DASH diet.  To notify provider if any dry cough presents.  LABS: CMP.        Relevant Orders   Comprehensive metabolic panel     Endocrine   IFG (impaired fasting glucose)    A1c last visit 5.7%.  Recheck today and recommend continued focus on diet and exercise.      Relevant Orders   HgB A1c     Other   Abdominal pain    Ongoing issue, reached out to GI, Dr. Tobi Bastos, via secure chat and they will work on getting EGD added on to colonoscopy due to ongoing discomfort.  Will not add GERD medication at this time, get testing first.  Continue collaboration with GI.  Recommend continue focus on diet and BLAND diet until testing.      Depression with anxiety - Primary    Chronic, stable. Continue Sertraline to 200 MG (max dose), Wellbutrin XL 150 MG daily, and Buspar 5 MG BID.  Denies SI/HI. Return in 6 months.  Continue to collaborate with urology on low testosterone.      Elevated low density lipoprotein (LDL) cholesterol level    Noted on past labs, check lipid panel today and CMP.  ASCVD 2.5%, diet focus at this time.      Relevant Orders   Comprehensive metabolic panel   Lipid Panel w/o Chol/HDL Ratio   Obesity    BMI 36.90.  Recommended eating smaller high protein, low fat meals more frequently and exercising 30 mins a day 5 times a week with a goal  of 10-15lb weight loss in the next 3 months. Patient voiced their understanding and motivation to adhere to these recommendations.         Follow up plan: Return in about 5 weeks (around 07/14/2022) for ABDOMINAL PAIN.

## 2022-06-09 NOTE — Assessment & Plan Note (Signed)
Chronic, stable.  BP well below goal today. Continue Lisinopril 40 MG at this time and if elevations above goal in future consider addition of Amlodipine.  Recommend he continue to monitor BP at home and document + focus on DASH diet.  To notify provider if any dry cough presents.  LABS: CMP.

## 2022-06-09 NOTE — Assessment & Plan Note (Signed)
BMI 36.90.  Recommended eating smaller high protein, low fat meals more frequently and exercising 30 mins a day 5 times a week with a goal of 10-15lb weight loss in the next 3 months. Patient voiced their understanding and motivation to adhere to these recommendations.

## 2022-06-09 NOTE — Assessment & Plan Note (Signed)
Ongoing issue, reached out to GI, Dr. Tobi Bastos, via secure chat and they will work on getting EGD added on to colonoscopy due to ongoing discomfort.  Will not add GERD medication at this time, get testing first.  Continue collaboration with GI.  Recommend continue focus on diet and BLAND diet until testing.

## 2022-06-09 NOTE — Telephone Encounter (Signed)
Mrs. Harvest Dark, NP got in contact with Dr. Tobi Bastos and they stated the following: He is visiting for follow-up, 6 month visit, today and still having abdominal pain and heart burn.  Wanted to pass on to you as he said you may do EGD if still having discomfort with his upcoming colonoscopy.  I will hold off ordering any heart burn medication until after this.  States pain after eating and activity -- like someone squeezing and letting go + burning sensation to sternal area on occasion.    1  34 mins You were added by Wyline Mood, MD. 33 mins KA Wyline Mood, MD Judi Saa can we add egd to colon please  2  32 mins JC Marjie Skiff, NP Thank you:) I will let him know:)  32 mins KA Wyline Mood, MD Thanks !  31 mins Dr. Tobi Bastos, please let me know under what diagnosis to order the EGD?  29 mins KA Wyline Mood, MD abdominal pain   1  29 mins Dr. Tobi Bastos, the endoscopy unit stated that they can not add the EGD as you are already full. Unless the patient gets his procedures done on another day.  your next available to do a double is the following day 06/17/2022  23 mins Wyline Mood, MD [facility]  [facility] 4/30  17 mins Mrs. Cannady, I will call the patient to let him know that we had to change his date to 06/17/2022.  15 mins JC Marjie Skiff, NP Thank you. He just left office.  I am sure he will be agreeable as would like both at same time.  2   13 mins Taken care of.  Patient was then contacted and he agreed on having his procedures done on 06/17/2022. The endoscopy unit was then contacted.

## 2022-06-09 NOTE — Assessment & Plan Note (Signed)
A1c last visit 5.7%.  Recheck today and recommend continued focus on diet and exercise.

## 2022-06-09 NOTE — Assessment & Plan Note (Signed)
Chronic, stable. Continue Sertraline to 200 MG (max dose), Wellbutrin XL 150 MG daily, and Buspar 5 MG BID.  Denies SI/HI. Return in 6 months.  Continue to collaborate with urology on low testosterone.

## 2022-06-10 ENCOUNTER — Ambulatory Visit: Payer: 59 | Admitting: Nurse Practitioner

## 2022-06-10 LAB — COMPREHENSIVE METABOLIC PANEL
ALT: 19 IU/L (ref 0–44)
AST: 18 IU/L (ref 0–40)
Albumin/Globulin Ratio: 1.9 (ref 1.2–2.2)
Albumin: 4.5 g/dL (ref 4.1–5.1)
Alkaline Phosphatase: 96 IU/L (ref 44–121)
BUN/Creatinine Ratio: 17 (ref 9–20)
BUN: 15 mg/dL (ref 6–24)
Bilirubin Total: <0.2 mg/dL (ref 0.0–1.2)
CO2: 22 mmol/L (ref 20–29)
Calcium: 9.4 mg/dL (ref 8.7–10.2)
Chloride: 104 mmol/L (ref 96–106)
Creatinine, Ser: 0.9 mg/dL (ref 0.76–1.27)
Globulin, Total: 2.4 g/dL (ref 1.5–4.5)
Glucose: 79 mg/dL (ref 70–99)
Potassium: 4.2 mmol/L (ref 3.5–5.2)
Sodium: 142 mmol/L (ref 134–144)
Total Protein: 6.9 g/dL (ref 6.0–8.5)
eGFR: 107 mL/min/{1.73_m2} (ref >59)

## 2022-06-10 LAB — LIPID PANEL W/O CHOL/HDL RATIO
Cholesterol, Total: 178 mg/dL (ref 100–199)
HDL: 34 mg/dL — ABNORMAL LOW (ref >39)
LDL Chol Calc (NIH): 98 mg/dL (ref 0–99)
Triglycerides: 270 mg/dL — ABNORMAL HIGH (ref 0–149)
VLDL Cholesterol Cal: 46 mg/dL — ABNORMAL HIGH (ref 5–40)

## 2022-06-10 LAB — HEMOGLOBIN A1C
Est. average glucose Bld gHb Est-mCnc: 117 mg/dL
Hgb A1c MFr Bld: 5.7 % — ABNORMAL HIGH (ref 4.8–5.6)

## 2022-06-10 NOTE — Progress Notes (Signed)
Contacted via MyChart   Good afternoon Demarkus, your labs have returned: - Cholesterol levels show improving LDL, but triglycerides a bit elevated -- we will recheck fasting next visit at physical.  Continue medications. - A1c remains in prediabetic range at 5.7% with no worsening, continue diet and exercise focus. - Kidney function, creatinine and eGFR, remains normal, as is liver function, AST and ALT.  Overall good labs.  Any questions? Keep being amazing!!  Thank you for allowing me to participate in your care.  I appreciate you. Kindest regards, Jorian [place]hoite

## 2022-06-16 ENCOUNTER — Encounter: Payer: Self-pay | Admitting: Gastroenterology

## 2022-06-16 ENCOUNTER — Ambulatory Visit: Admit: 2022-06-16 | Payer: BLUE CROSS/BLUE SHIELD | Admitting: Gastroenterology

## 2022-06-16 SURGERY — COLONOSCOPY WITH PROPOFOL
Anesthesia: General

## 2022-06-17 ENCOUNTER — Encounter
Admission: RE | Disposition: A | Discharge status: Home / Self Care | Payer: Self-pay | Source: Home / Self Care | Attending: Gastroenterology

## 2022-06-17 ENCOUNTER — Ambulatory Visit: Payer: Self-pay | Admitting: Certified Registered"

## 2022-06-17 ENCOUNTER — Telehealth: Payer: Self-pay

## 2022-06-17 ENCOUNTER — Other Ambulatory Visit: Payer: Self-pay | Admitting: *Deleted

## 2022-06-17 ENCOUNTER — Ambulatory Visit
Admission: RE | Admit: 2022-06-17 | Discharge: 2022-06-17 | Disposition: A | Discharge status: Home / Self Care | Payer: BLUE CROSS/BLUE SHIELD | Attending: Gastroenterology | Admitting: Gastroenterology

## 2022-06-17 ENCOUNTER — Encounter: Payer: Self-pay | Admitting: Gastroenterology

## 2022-06-17 ENCOUNTER — Other Ambulatory Visit: Payer: Self-pay

## 2022-06-17 DIAGNOSIS — K921 Melena: Secondary | ICD-10-CM | POA: Insufficient documentation

## 2022-06-17 DIAGNOSIS — R195 Other fecal abnormalities: Secondary | ICD-10-CM

## 2022-06-17 DIAGNOSIS — R1084 Generalized abdominal pain: Secondary | ICD-10-CM

## 2022-06-17 DIAGNOSIS — F32A Depression, unspecified: Secondary | ICD-10-CM | POA: Insufficient documentation

## 2022-06-17 DIAGNOSIS — K269 Duodenal ulcer, unspecified as acute or chronic, without hemorrhage or perforation: Secondary | ICD-10-CM | POA: Insufficient documentation

## 2022-06-17 DIAGNOSIS — I10 Essential (primary) hypertension: Secondary | ICD-10-CM | POA: Insufficient documentation

## 2022-06-17 DIAGNOSIS — K2980 Duodenitis without bleeding: Secondary | ICD-10-CM | POA: Insufficient documentation

## 2022-06-17 DIAGNOSIS — R1012 Left upper quadrant pain: Secondary | ICD-10-CM | POA: Insufficient documentation

## 2022-06-17 DIAGNOSIS — F419 Anxiety disorder, unspecified: Secondary | ICD-10-CM | POA: Insufficient documentation

## 2022-06-17 DIAGNOSIS — G4730 Sleep apnea, unspecified: Secondary | ICD-10-CM | POA: Insufficient documentation

## 2022-06-17 HISTORY — PX: COLONOSCOPY: SHX5424

## 2022-06-17 HISTORY — PX: ESOPHAGOGASTRODUODENOSCOPY (EGD) WITH PROPOFOL: SHX5813

## 2022-06-17 SURGERY — ESOPHAGOGASTRODUODENOSCOPY (EGD) WITH PROPOFOL
Anesthesia: General

## 2022-06-17 MED ORDER — MIDAZOLAM HCL 2 MG/2ML IJ SOLN
INTRAMUSCULAR | Status: AC
  Filled 2022-06-17: qty 2

## 2022-06-17 MED ORDER — LIDOCAINE HCL (CARDIAC) PF 100 MG/5ML IV SOSY
PREFILLED_SYRINGE | INTRAVENOUS | Status: DC | PRN
  Administered 2022-06-17: 100 mg via INTRAVENOUS

## 2022-06-17 MED ORDER — HEPARIN SODIUM (PORCINE) 1000 UNIT/ML IJ SOLN: INTRAMUSCULAR | Status: DC | PRN

## 2022-06-17 MED ORDER — MIDAZOLAM HCL 2 MG/2ML IJ SOLN
INTRAMUSCULAR | Status: DC | PRN
  Administered 2022-06-17: 2 mg via INTRAVENOUS

## 2022-06-17 MED ORDER — PROPOFOL 10 MG/ML IV BOLUS
INTRAVENOUS | Status: DC | PRN
  Administered 2022-06-17: 70 mg via INTRAVENOUS
  Administered 2022-06-17: 30 mg via INTRAVENOUS

## 2022-06-17 MED ORDER — SODIUM CHLORIDE 0.9 % IV SOLN: INTRAVENOUS | Status: DC

## 2022-06-17 MED ORDER — PEG 3350-KCL-NABCB-NACL-NASULF 236 G PO SOLR: 4000.0000 mL | Freq: Once | ORAL | 0 refills | Status: AC

## 2022-06-17 MED ORDER — PROPOFOL 500 MG/50ML IV EMUL
INTRAVENOUS | Status: DC | PRN
  Administered 2022-06-17: 165 ug/kg/min via INTRAVENOUS

## 2022-06-17 NOTE — Telephone Encounter (Signed)
Pt left message to schedule colonoscopy please return call  

## 2022-06-17 NOTE — Addendum Note (Signed)
Addended by: Tawnya Crook on: 06/17/2022 04:19 PM   Modules accepted: Orders

## 2022-06-17 NOTE — Op Note (Signed)
[facility] Gastroenterology Patient Name: Joe Gutierrez Procedure Date: 06/17/2022 9:34 AM MRN: [id number] Account #: [id number] Date of Birth: 08/21/1975 Admit Type: Outpatient Age: 47 Room: [facility] ENDO ROOM 2 Gender: Male Note Status: Finalized Instrument Name: Upper Endoscope [id number] Procedure:             Upper GI endoscopy Indications:           Abdominal pain in the left upper quadrant Providers:             Wyline Mood MD, MD Referring MD:          Dorie Rank. Harvest Dark (Referring MD) Medicines:             Monitored Anesthesia Care Complications:         No immediate complications. Procedure:             Pre-Anesthesia Assessment:                        - Prior to the procedure, a History and Physical was                         performed, and patient medications, allergies and                         sensitivities were reviewed. The patient's tolerance                         of previous anesthesia was reviewed.                        - The risks and benefits of the procedure and the                         sedation options and risks were discussed with the                         patient. All questions were answered and informed                         consent was obtained.                        - ASA Grade Assessment: II - A patient with mild                         systemic disease.                        After obtaining informed consent, the endoscope was                         passed under direct vision. Throughout the procedure,                         the patient's blood pressure, pulse, and oxygen                         saturations were monitored continuously. The Endoscope  was introduced through the mouth, and advanced to the                         third part of duodenum. The upper GI endoscopy was                         accomplished with ease. The patient tolerated the                         procedure well. Findings:       The esophagus was normal.      The stomach was normal.      The cardia and gastric fundus were normal on retroflexion.      Three non-bleeding superficial duodenal ulcers with a clean ulcer base       (Forrest Class III) were found in the duodenal bulb. The largest lesion       was 6 mm in largest dimension. Biopsies were taken with a cold forceps       for histology.      The exam was otherwise without abnormality. Impression:            - Normal esophagus.                        - Normal stomach.                        - Non-bleeding duodenal ulcers with a clean ulcer base                         (Forrest Class III). Biopsied.                        - The examination was otherwise normal. Recommendation:        - Await pathology results.                        - Perform a colonoscopy today.                        - Avoid nsaids                        OTC Omeprazole 40 mg a day daily for 6 weeks Procedure Code(s):     --- Professional ---                        [phone], Esophagogastroduodenoscopy, flexible,                         transoral; with biopsy, single or multiple Diagnosis Code(s):     --- Professional ---                        K26.9, Duodenal ulcer, unspecified as acute or                         chronic, without hemorrhage or perforation                        R10.12, Left upper quadrant pain CPT copyright 2022 American Medical Association.  All rights reserved. The codes documented in this report are preliminary and upon coder review may  be revised to meet current compliance requirements. Wyline Mood, MD Wyline Mood MD, MD 06/17/2022 9:52:10 AM This report has been signed electronically. Number of Addenda: 0 Note Initiated On: 06/17/2022 9:34 AM Estimated Blood Loss:  Estimated blood loss: none.      [facility]

## 2022-06-17 NOTE — Op Note (Signed)
[facility] Gastroenterology Patient Name: Joe Gutierrez Procedure Date: 06/17/2022 9:33 AM MRN: [id number] Account #: [id number] Date of Birth: November 20, 1975 Admit Type: Outpatient Age: 47 Room: [facility] ENDO ROOM 2 Gender: Male Note Status: Finalized Instrument Name: Prentice Docker [id number] Procedure:             Colonoscopy Indications:           Heme positive stool Providers:             Wyline Mood MD, MD Referring MD:          Dorie Rank. Harvest Dark (Referring MD) Medicines:             Monitored Anesthesia Care Complications:         No immediate complications. Procedure:             Pre-Anesthesia Assessment:                        - Prior to the procedure, a History and Physical was                         performed, and patient medications, allergies and                         sensitivities were reviewed. The patient's tolerance                         of previous anesthesia was reviewed.                        - The risks and benefits of the procedure and the                         sedation options and risks were discussed with the                         patient. All questions were answered and informed                         consent was obtained.                        After obtaining informed consent, the colonoscope was                         passed under direct vision. Throughout the procedure,                         the patient's blood pressure, pulse, and oxygen                         saturations were monitored continuously. The                         Colonoscope was introduced through the anus with the                         intention of advancing to the cecum. The scope was  advanced to the descending colon before the procedure                         was aborted. Medications were given. The colonoscopy                         was performed with ease. The patient tolerated the                         procedure well. The quality  of the bowel preparation                         was unsatisfactory. Findings:      A moderate amount of semi-solid stool was found in the rectum, in the       sigmoid colon and in the descending colon, making visualization       difficult. Impression:            - Preparation of the colon was unsatisfactory.                        - Stool in the rectum, in the sigmoid colon and in the                         descending colon.                        - No specimens collected. Recommendation:        - Discharge patient to home (with escort).                        - Resume previous diet.                        - Continue present medications.                        - Repeat colonoscopy in 2 weeks because the bowel                         preparation was suboptimal. Procedure Code(s):     --- Professional ---                        [phone], 53, Colonoscopy, flexible; diagnostic,                         including collection of specimen(s) by brushing or                         washing, when performed (separate procedure) Diagnosis Code(s):     --- Professional ---                        R19.5, Other fecal abnormalities CPT copyright 2022 American Medical Association. All rights reserved. The codes documented in this report are preliminary and upon coder review may  be revised to meet current compliance requirements. Wyline Mood, MD Wyline Mood MD, MD 06/17/2022 9:56:20 AM This report has been signed electronically. Number of Addenda: 0 Note Initiated On: 06/17/2022 9:33 AM Total Procedure Duration: 0 hours  1 minute 33 seconds  Estimated Blood Loss:  Estimated blood loss: none.      [facility]

## 2022-06-17 NOTE — Telephone Encounter (Addendum)
Patient had his colonoscopy today and it was suboptimals.  colonoscopy have been schedule for 07/21/2022.   Instruction will be sent and Rx for Golytely to [facility]

## 2022-06-17 NOTE — Anesthesia Procedure Notes (Signed)
Procedure Name: General with mask airway Date/Time: 06/17/2022 9:45 AM  Performed by: Mohammed Kindle, CRNAPre-anesthesia Checklist: Patient identified, Emergency Drugs available, Suction available and Patient being monitored Patient Re-evaluated:Patient Re-evaluated prior to induction Oxygen Delivery Method: Simple face mask Induction Type: IV induction Placement Confirmation: positive ETCO2, CO2 detector and breath sounds checked- equal and bilateral Dental Injury: Teeth and Oropharynx as per pre-operative assessment

## 2022-06-17 NOTE — Anesthesia Preprocedure Evaluation (Signed)
Anesthesia Evaluation  Patient identified by MRN, date of birth, ID band Patient awake    Reviewed: Allergy & Precautions, NPO status , Patient's Chart, lab work & pertinent test results  History of Anesthesia Complications Negative for: history of anesthetic complications  Airway Mallampati: III  TM Distance: >3 FB Neck ROM: Full    Dental no notable dental hx. (+) Teeth Intact   Pulmonary sleep apnea , neg COPD, Patient abstained from smoking.Not current smoker   Pulmonary exam normal breath sounds clear to auscultation       Cardiovascular Exercise Tolerance: Good METShypertension, Pt. on medications (-) CAD and (-) Past MI (-) dysrhythmias  Rhythm:Regular Rate:Normal - Systolic murmurs    Neuro/Psych  PSYCHIATRIC DISORDERS Anxiety Depression    negative neurological ROS     GI/Hepatic ,neg GERD  ,,(+)     (-) substance abuse    Endo/Other  neg diabetes    Renal/GU negative Renal ROS     Musculoskeletal   Abdominal  (+) + obese  Peds  Hematology   Anesthesia Other Findings Past Medical History: No date: Anxiety No date: Arthritis No date: Colon polyp No date: Depression No date: Heart murmur No date: History of kidney stones No date: Hypertension No date: Hypogonadism in male No date: Obesity No date: Sleep apnea  Reproductive/Obstetrics                             Anesthesia Physical Anesthesia Plan  ASA: 2  Anesthesia Plan: General   Post-op Pain Management: Minimal or no pain anticipated   Induction: Intravenous  PONV Risk Score and Plan: 2 and Propofol infusion, TIVA and Ondansetron  Airway Management Planned: Nasal Cannula  Additional Equipment: None  Intra-op Plan:   Post-operative Plan:   Informed Consent: I have reviewed the patients History and Physical, chart, labs and discussed the procedure including the risks, benefits and alternatives for the  proposed anesthesia with the patient or authorized representative who has indicated his/her understanding and acceptance.     Dental advisory given  Plan Discussed with: CRNA and Surgeon  Anesthesia Plan Comments: (Discussed risks of anesthesia with patient, including possibility of difficulty with spontaneous ventilation under anesthesia necessitating airway intervention, PONV, and rare risks such as cardiac or respiratory or neurological events, and allergic reactions. Discussed the role of CRNA in patient's perioperative care. Patient understands.)       Anesthesia Quick Evaluation

## 2022-06-17 NOTE — Transfer of Care (Signed)
Immediate Anesthesia Transfer of Care Note  Patient: Joe Gutierrez  Procedure(s) Performed: ESOPHAGOGASTRODUODENOSCOPY (EGD) WITH PROPOFOL COLONOSCOPY  Patient Location: Endoscopy Unit  Anesthesia Type:General  Level of Consciousness: awake, drowsy, and patient cooperative  Airway & Oxygen Therapy: Patient Spontanous Breathing and Patient connected to face mask oxygen  Post-op Assessment: Report given to RN and Post -op Vital signs reviewed and stable  Post vital signs: Reviewed and stable  Last Vitals:  Vitals Value Taken Time  BP 101/76 06/17/22 1000  Temp    Pulse 69 06/17/22 1002  Resp 17 06/17/22 1002  SpO2 100 % 06/17/22 1002  Vitals shown include unvalidated device data.  Last Pain:  Vitals:   06/17/22 1000  TempSrc:   PainSc: 0-No pain         Complications: No notable events documented.

## 2022-06-17 NOTE — H&P (Signed)
Wyline Mood, MD [address], [address], [place], [place], [address] [address], [address], [place], [place], [address] Phone: [phone]  Fax: [phone]  Primary Care Physician:  Marjie Skiff, NP   Pre-Procedure History & Physical: HPI:  Joe Gutierrez is a 47 y.o. male is here for an endoscopy and colonoscopy    Past Medical History:  Diagnosis Date   Anxiety    Arthritis    Colon polyp    Depression    Heart murmur    History of kidney stones    Hypertension    Hypogonadism in male    Obesity    Sleep apnea     Past Surgical History:  Procedure Laterality Date   APPENDECTOMY     CLOSED REDUCTION NASAL FRACTURE N/A 06/11/2021   Procedure: CLOSED REDUCTION NASAL FRACTURE;  Surgeon: Allena Napoleon, MD;  Location: [facility] OR;  Service: Plastics;  Laterality: N/A;   COLONOSCOPY WITH PROPOFOL N/A 11/16/2020   Procedure: COLONOSCOPY WITH PROPOFOL;  Surgeon: Wyline Mood, MD;  Location: [facility] ENDOSCOPY;  Service: Gastroenterology;  Laterality: N/A;   LAPAROSCOPIC RIGHT COLECTOMY N/A 01/24/2021   Procedure: LAPAROSCOPIC RIGHT COLECTOMY with Laqueta Due, PA-C assisting;  Surgeon: Leafy Ro, MD;  Location: [facility] ORS;  Service: General;  Laterality: N/A;   LITHOTRIPSY      Prior to Admission medications   Medication Sig Start Date End Date Taking? Authorizing Provider  albuterol (VENTOLIN HFA) 108 (90 Base) MCG/ACT inhaler INHALE 2 PUFFS BY MOUTH EVERY 6 HOURS AS NEEDED FOR WHEEZE OR SHORTNESS OF BREATH 04/11/22   Cannady, Jolene T, NP  buPROPion (WELLBUTRIN XL) 150 MG 24 hr tablet Take 1 tablet (150 mg total) by mouth daily. 12/09/21   Cannady, Corrie Dandy T, NP  busPIRone (BUSPAR) 5 MG tablet Take 1 tablet (5 mg total) by mouth 2 (two) times daily. 12/09/21   Cannady, Corrie Dandy T, NP  lisinopril (ZESTRIL) 40 MG tablet TAKE 1 TABLET(40 MG) BY MOUTH DAILY 12/09/21   Cannady, Jolene T, NP  Na Sulfate-K Sulfate-Mg Sulf 17.5-3.13-1.6 GM/177ML SOLN PLEASE SEE ATTACHED FOR DETAILED  DIRECTIONS 05/19/22   [provider]  NEEDLE, DISP, 18 G (BD DISP NEEDLES) 18G X 1-1/2" MISC 1 mg by Does not apply route every 14 (fourteen) days. 01/27/22   Michiel Cowboy A, PA-C  NEEDLE, DISP, 21 G (BD DISP NEEDLES) 21G X 1-1/2" MISC 1 mg by Does not apply route every 14 (fourteen) days. 01/27/22   Michiel Cowboy A, PA-C  sertraline (ZOLOFT) 100 MG tablet Take 2 tablets (200 mg total) by mouth daily. 12/09/21   Cannady, Corrie Dandy T, NP  Syringe, Disposable, (2-3CC SYRINGE) 3 ML MISC 1 mg by Does not apply route every 14 (fourteen) days. 01/27/22   Michiel Cowboy A, PA-C  testosterone cypionate (DEPOTESTOSTERONE CYPIONATE) 200 MG/ML injection Inject 1 mL (200 mg total) into the muscle every 14 (fourteen) days. 01/27/22   Michiel Cowboy A, PA-C  Vitamin D, Cholecalciferol, 25 MCG (1000 UT) TABS Take 1,000 Units by mouth daily.    [provider]    Allergies as of 06/10/2022 - Review Complete 06/09/2022  Allergen Reaction Noted   Codeine Hives 01/24/2015   Sulfa antibiotics Hives 01/24/2015    Family History  Problem Relation Age of Onset   Diabetes Mother    Hypertension Mother    Alcohol abuse Father    Heart disease Father    Hypertension Father    Diabetes Maternal Grandmother  Stroke Maternal Grandfather    Hypertension Brother    Asthma Daughter    Heart disease Paternal Grandfather     Social History   Socioeconomic History   Marital status: Married    Spouse name: Tabatha   Number of children: 3   Years of education: Not on file   Highest education level: Not on file  Occupational History   Not on file  Tobacco Use   Smoking status: Never   Smokeless tobacco: Never  Vaping Use   Vaping Use: Never used  Substance and Sexual Activity   Alcohol use: No   Drug use: No   Sexual activity: Yes  Other Topics Concern   Not on file  Social History Narrative   Not on file   Social Determinants of Health   Financial Resource Strain: Low  Risk  (11/26/2020)   Overall Financial Resource Strain (CARDIA)    Difficulty of Paying Living Expenses: Not hard at all  Food Insecurity: No Food Insecurity (11/26/2020)   Hunger Vital Sign    Worried About Running Out of Food in the Last Year: Never true    Ran Out of Food in the Last Year: Never true  Transportation Needs: No Transportation Needs (11/26/2020)   PRAPARE - Administrator, Civil Service (Medical): No    Lack of Transportation (Non-Medical): No  Physical Activity: Sufficiently Active (11/26/2020)   Exercise Vital Sign    Days of Exercise per Week: 5 days    Minutes of Exercise per Session: 30 min  Stress: Stress Concern Present (11/26/2020)   [employer] of Occupational Health - Occupational Stress Questionnaire    Feeling of Stress : To some extent  Social Connections: Moderately Integrated (11/26/2020)   Social Connection and Isolation Panel [NHANES]    Frequency of Communication with Friends and Family: Three times a week    Frequency of Social Gatherings with Friends and Family: Three times a week    Attends Religious Services: More than 4 times per year    Active Member of Clubs or Organizations: No    Attends Banker Meetings: Never    Marital Status: Married  Catering manager Violence: Not At Risk (11/26/2020)   Humiliation, Afraid, Rape, and Kick questionnaire    Fear of Current or Ex-Partner: No    Emotionally Abused: No    Physically Abused: No    Sexually Abused: No    Review of Systems: See HPI, otherwise negative ROS  Physical Exam: There were no vitals taken for this visit. General:   Alert,  pleasant and cooperative in NAD Head:  Normocephalic and atraumatic. Neck:  Supple; no masses or thyromegaly. Lungs:  Clear throughout to auscultation, normal respiratory effort.    Heart:  +S1, +S2, Regular rate and rhythm, No edema. Abdomen:  Soft, nontender and nondistended. Normal bowel sounds, without guarding, and  without rebound.   Neurologic:  Alert and  oriented x4;  grossly normal neurologically.  Impression/Plan: Joe Gutierrez is here for an endoscopy and colonoscopy  to be performed for  evaluation of abdominal pain and blood in stool.     Risks, benefits, limitations, and alternatives regarding endoscopy have been reviewed with the patient.  Questions have been answered.  All parties agreeable.   Wyline Mood, MD  06/17/2022, 9:05 AM

## 2022-06-17 NOTE — Addendum Note (Signed)
Addendum  created 06/17/22 1228 by Mohammed Kindle, CRNA   Child order released for a procedure order, Clinical Note Signed, Flowsheet accepted, Intraprocedure Blocks edited, Intraprocedure Flowsheets edited, Intraprocedure Meds edited, SmartForm saved

## 2022-06-17 NOTE — Anesthesia Postprocedure Evaluation (Signed)
Anesthesia Post Note  Patient: Joe Gutierrez  Procedure(s) Performed: ESOPHAGOGASTRODUODENOSCOPY (EGD) WITH PROPOFOL COLONOSCOPY  Patient location during evaluation: Endoscopy Anesthesia Type: General Level of consciousness: awake and alert Pain management: pain level controlled Vital Signs Assessment: post-procedure vital signs reviewed and stable Respiratory status: spontaneous breathing, nonlabored ventilation, respiratory function stable and patient connected to nasal cannula oxygen Cardiovascular status: blood pressure returned to baseline and stable Postop Assessment: no apparent nausea or vomiting Anesthetic complications: no   No notable events documented.   Last Vitals:  Vitals:   06/17/22 1000 06/17/22 1027  BP: 101/76   Pulse: 70 66  Resp: 18   Temp:  (!) 35.8 C  SpO2: 100%     Last Pain:  Vitals:   06/17/22 1027  TempSrc: Temporal  PainSc: 0-No pain                 Corinda Gubler

## 2022-06-18 ENCOUNTER — Encounter: Payer: Self-pay | Admitting: Gastroenterology

## 2022-06-18 LAB — SURGICAL PATHOLOGY

## 2022-06-20 ENCOUNTER — Other Ambulatory Visit: Payer: Self-pay

## 2022-06-20 DIAGNOSIS — N138 Other obstructive and reflux uropathy: Secondary | ICD-10-CM

## 2022-06-20 DIAGNOSIS — E349 Endocrine disorder, unspecified: Secondary | ICD-10-CM

## 2022-06-24 ENCOUNTER — Encounter: Payer: Self-pay | Admitting: Physician Assistant

## 2022-06-24 ENCOUNTER — Encounter: Payer: Self-pay | Admitting: Gastroenterology

## 2022-06-24 DIAGNOSIS — R1084 Generalized abdominal pain: Secondary | ICD-10-CM

## 2022-06-26 NOTE — Addendum Note (Signed)
Addended by: Adela Ports on: 06/26/2022 10:09 AM   Modules accepted: Orders

## 2022-06-29 ENCOUNTER — Emergency Department: Payer: BLUE CROSS/BLUE SHIELD

## 2022-06-29 ENCOUNTER — Emergency Department
Admission: EM | Admit: 2022-06-29 | Discharge: 2022-06-29 | Disposition: A | Discharge status: Home / Self Care | Payer: BLUE CROSS/BLUE SHIELD | Attending: Emergency Medicine | Admitting: Emergency Medicine

## 2022-06-29 ENCOUNTER — Other Ambulatory Visit: Payer: Self-pay

## 2022-06-29 DIAGNOSIS — M79662 Pain in left lower leg: Secondary | ICD-10-CM | POA: Diagnosis present

## 2022-06-29 MED ORDER — MELOXICAM 15 MG PO TABS: 15.0000 mg | ORAL_TABLET | Freq: Every day | ORAL | 1 refills | Status: AC

## 2022-06-29 NOTE — ED Provider Notes (Signed)
[facility] Provider Note  Patient Contact: 10:00 PM (approximate)   History   Leg Pain   HPI  Joe Gutierrez is a 47 y.o. male presents to the emergency department with pain along the medial aspect of the left gastrocnemius that occurred while patient was playing sports earlier this evening.  Patient states that he has had difficulty bearing weight.  No other falls or mechanisms of trauma.       Physical Exam   Triage Vital Signs: ED Triage Vitals  Enc Vitals Group     BP 06/29/22 2050 110/83     Pulse Rate 06/29/22 2050 78     Resp 06/29/22 2050 18     Temp 06/29/22 2050 98.2 F (36.8 C)     Temp Source 06/29/22 2050 Oral     SpO2 06/29/22 2050 95 %     Weight 06/29/22 2044 250 lb (113.4 kg)     Height 06/29/22 2044 5\' 10"  (1.778 m)     Head Circumference --      Peak Flow --      Pain Score 06/29/22 2051 9     Pain Loc --      Pain Edu? --      Excl. in GC? --     Most recent vital signs: Vitals:   06/29/22 2050 06/29/22 2122  BP: 110/83 111/74  Pulse: 78 73  Resp: 18 18  Temp: 98.2 F (36.8 C)   SpO2: 95% 98%     General: Alert and in no acute distress. Eyes:  PERRL. EOMI. Head: No acute traumatic findings ENT:      Nose: No congestion/rhinnorhea.      Mouth/Throat: Mucous membranes are moist.  Neck: No stridor. No cervical spine tenderness to palpation. Cardiovascular:  Good peripheral perfusion Respiratory: Normal respiratory effort without tachypnea or retractions. Lungs CTAB. Good air entry to the bases with no decreased or absent breath sounds. Gastrointestinal: Bowel sounds 4 quadrants. Soft and nontender to palpation. No guarding or rigidity. No palpable masses. No distention. No CVA tenderness. Musculoskeletal: Full range of motion to all extremities.  Patient has tenderness to palpation along medial aspect of left gastrocnemius.  Palpable dorsalis pedis pulse, left. Neurologic:  No gross focal neurologic deficits  are appreciated.  Skin:   No rash noted Other:   ED Results / Procedures / Treatments   Labs (all labs ordered are listed, but only abnormal results are displayed) Labs Reviewed - No data to display      PROCEDURES:  Critical Care performed: No  Procedures   MEDICATIONS ORDERED IN ED: Medications - No data to display   IMPRESSION / MDM / ASSESSMENT AND PLAN / ED COURSE  I reviewed the triage vital signs and the nursing notes.                              Assessment and plan Calf pain 47 year old male presents to the emergency department with left-sided calf pain that occurred while patient was playing sports.  Vital signs are reassuring at triage.  On exam, patient was alert and nontoxic-appearing.  He did have left medial calf tenderness to palpation concerning for gastrocnemius injury.  Patient was placed in a cam boot and crutches were provided.  He was given outpatient follow-up with orthopedics and started on daily meloxicam.     FINAL CLINICAL IMPRESSION(S) / ED DIAGNOSES   Final diagnoses:  Pain  of left calf     Rx / DC Orders   ED Discharge Orders          Ordered    meloxicam (MOBIC) 15 MG tablet  Daily        06/29/22 2157             Note:  This document was prepared using Dragon voice recognition software and may include unintentional dictation errors.   Pia Mau Weir, PA-C 06/29/22 2204    Georga Hacking, MD 06/30/22 [phone]

## 2022-06-29 NOTE — ED Triage Notes (Signed)
Pt reports while playing volleyball he felt and heard a pop in his L calf. Pt utilizing wheelchair in lobby. Alert and oriented following commands. Breathing unlabored speaking in full sentences with symmetric chest rise/fall.

## 2022-06-29 NOTE — Discharge Instructions (Addendum)
Take Meloxicam once daily for pain and inflammation.  

## 2022-06-29 NOTE — ED Notes (Signed)
Gave pt Ice pack. Family at bedside

## 2022-06-30 ENCOUNTER — Telehealth: Payer: Self-pay

## 2022-06-30 NOTE — Transitions of Care (Post Inpatient/ED Visit) (Signed)
06/30/2022  Name: Joe Gutierrez MRN: [id number] DOB: October 29, 1975  Today's TOC FU Call Status: Today's TOC FU Call Status:: Successful TOC FU Call Competed TOC FU Call Complete Date: 06/30/22  Transition Care Management Follow-up Telephone Call Date of Discharge: 06/29/22 Discharge Facility: [facility]) Type of Discharge: Emergency Department Reason for ED Visit: Other: How have you been since you were released from the hospital?: Same Any questions or concerns?: No  Items Reviewed: Did you receive and understand the discharge instructions provided?: Yes Medications obtained,verified, and reconciled?: Yes (Medications Reviewed) Any new allergies since your discharge?: No Dietary orders reviewed?: NA Do you have support at home?: Yes  Medications Reviewed Today: Medications Reviewed Today     Reviewed by Althea Charon, RN (Registered Nurse) on 06/17/22 at [phone]  Med List Status: <None>    albuterol (VENTOLIN HFA) 108 (90 Base) MCG/ACT inhaler   Order: [id number] Taking?: Not Known Sig: INHALE 2 PUFFS BY MOUTH EVERY 6 HOURS AS NEEDED FOR WHEEZE OR SHORTNESS OF BREATH Documenting Provider: Marjie Skiff, NP Last Dose: None Status: Active Informant: Not Known        buPROPion (WELLBUTRIN XL) 150 MG 24 hr tablet   Order: [id number] Taking?: Yes Sig: Take 1 tablet (150 mg total) by mouth daily. Documenting Provider: Marjie Skiff, NP Last Dose: 06/16/2022 Status: Active Informant: Not Known        busPIRone (BUSPAR) 5 MG tablet   Order: [id number] Taking?: Yes Sig: Take 1 tablet (5 mg total) by mouth 2 (two) times daily. Documenting Provider: Marjie Skiff, NP Last Dose: 06/16/2022 Status: Active Informant: Not Known        lisinopril (ZESTRIL) 40 MG tablet   Order: [id number] Taking?: Yes Sig: TAKE 1 TABLET(40 MG) BY MOUTH DAILY Documenting Provider: Marjie Skiff, NP Last Dose: 06/16/2022 Status:  Active Informant: Not Known        Na Sulfate-K Sulfate-Mg Sulf 17.5-3.13-1.6 GM/177ML SOLN   Order: [id number] Taking?: Not Known Sig: PLEASE SEE ATTACHED FOR DETAILED DIRECTIONS Documenting Provider: [provider] Last Dose: None Status: Active Informant: Not Known        NEEDLE, DISP, 18 G (BD DISP NEEDLES) 18G X 1-1/2" MISC   Order: [id number] Taking?: Not Known Sig: 1 mg by Does not apply route every 14 (fourteen) days. Documenting Provider: Harle Battiest, PA-C Last Dose: None Status: Active Informant: Not Known        NEEDLE, DISP, 21 G (BD DISP NEEDLES) 21G X 1-1/2" MISC   Order: [id number] Taking?: Not Known Sig: 1 mg by Does not apply route every 14 (fourteen) days. Documenting Provider: Harle Battiest, PA-C Last Dose: None Status: Active Informant: Not Known        sertraline (ZOLOFT) 100 MG tablet   Order: [id number] Taking?: Yes Sig: Take 2 tablets (200 mg total) by mouth daily. Documenting Provider: Marjie Skiff, NP Last Dose: 06/16/2022 Status: Active Informant: Not Known        Syringe, Disposable, (2-3CC SYRINGE) 3 ML MISC   Order: [id number] Taking?: Not Known Sig: 1 mg by Does not apply route every 14 (fourteen) days. Documenting Provider: Harle Battiest, PA-C Last Dose: None Status: Active Informant: Not Known        testosterone cypionate (DEPOTESTOSTERONE CYPIONATE) 200 MG/ML injection   Order: [id number] Taking?: Yes Sig: Inject 1 mL (200 mg total) into the muscle every 14 (fourteen) days. Documenting Provider: Harle Battiest, PA-C Last Dose: Past Week  Status: Active Informant: Not Known        Vitamin D, Cholecalciferol, 25 MCG (1000 UT) TABS   Order: [id number] Taking?: Yes Sig: Take 1,000 Units by mouth daily. Documenting Provider: [provider] Last Dose: Past Week Status: Active Informant: Self                Home Care and Equipment/Supplies: Were Home Health  Services Ordered?: NA Any new equipment or medical supplies ordered?: NA  Functional Questionnaire: Do you need assistance with bathing/showering or dressing?: No Do you need assistance with meal preparation?: No Do you need assistance with eating?: No Do you have difficulty maintaining continence: No Do you need assistance with getting out of bed/getting out of a chair/moving?: No  Follow up appointments reviewed: PCP Follow-up appointment confirmed?: No (Declined) Specialist Hospital Follow-up appointment confirmed?: NA Do you need transportation to your follow-up appointment?: No Do you understand care options if your condition(s) worsen?: Yes-patient verbalized understanding    SIGNATURE Leward Quan, [facility]

## 2022-07-07 ENCOUNTER — Other Ambulatory Visit
Admission: RE | Admit: 2022-07-07 | Discharge: 2022-07-07 | Disposition: A | Discharge status: Home / Self Care | Payer: BLUE CROSS/BLUE SHIELD | Attending: Urology | Admitting: Urology

## 2022-07-07 ENCOUNTER — Other Ambulatory Visit: Payer: Self-pay

## 2022-07-07 DIAGNOSIS — N401 Enlarged prostate with lower urinary tract symptoms: Secondary | ICD-10-CM | POA: Diagnosis present

## 2022-07-07 DIAGNOSIS — N138 Other obstructive and reflux uropathy: Secondary | ICD-10-CM | POA: Insufficient documentation

## 2022-07-07 DIAGNOSIS — E349 Endocrine disorder, unspecified: Secondary | ICD-10-CM

## 2022-07-07 LAB — HEMOGLOBIN AND HEMATOCRIT, BLOOD
HCT: 43.4 % (ref 39.0–52.0)
Hemoglobin: 14.7 g/dL (ref 13.0–17.0)

## 2022-07-07 NOTE — Addendum Note (Signed)
Addended by: Caroline Sauger on: 07/07/2022 07:59 AM   Modules accepted: Orders

## 2022-07-07 NOTE — Addendum Note (Signed)
Addended by: Almir Botts H on: 07/07/2022 07:59 AM   Modules accepted: Orders  

## 2022-07-08 LAB — TESTOSTERONE: Testosterone: 500 ng/dL (ref 264–916)

## 2022-07-09 ENCOUNTER — Other Ambulatory Visit: Payer: Self-pay | Admitting: Family Medicine

## 2022-07-09 DIAGNOSIS — E349 Endocrine disorder, unspecified: Secondary | ICD-10-CM

## 2022-07-09 NOTE — Telephone Encounter (Signed)
I notified him of his lab results through MyChart.  He needs an appointment in June for a PSA and exam.

## 2022-07-10 MED ORDER — TESTOSTERONE CYPIONATE 200 MG/ML IM SOLN
200.0000 mg | INTRAMUSCULAR | 0 refills | Status: DC
Start: 2022-07-10 — End: 2023-01-13

## 2022-07-15 ENCOUNTER — Ambulatory Visit: Payer: BLUE CROSS/BLUE SHIELD | Admitting: Nurse Practitioner

## 2022-07-18 ENCOUNTER — Encounter: Payer: Self-pay | Admitting: Gastroenterology

## 2022-07-21 ENCOUNTER — Other Ambulatory Visit: Payer: Self-pay | Admitting: Family Medicine

## 2022-07-21 ENCOUNTER — Ambulatory Visit
Admission: RE | Admit: 2022-07-21 | Discharge: 2022-07-21 | Disposition: A | Discharge status: Home / Self Care | Payer: BLUE CROSS/BLUE SHIELD | Attending: Gastroenterology | Admitting: Gastroenterology

## 2022-07-21 ENCOUNTER — Ambulatory Visit: Payer: BLUE CROSS/BLUE SHIELD | Admitting: Anesthesiology

## 2022-07-21 ENCOUNTER — Encounter
Admission: RE | Disposition: A | Discharge status: Home / Self Care | Payer: Self-pay | Source: Home / Self Care | Attending: Gastroenterology

## 2022-07-21 ENCOUNTER — Encounter: Payer: Self-pay | Admitting: Gastroenterology

## 2022-07-21 ENCOUNTER — Other Ambulatory Visit: Payer: Self-pay

## 2022-07-21 DIAGNOSIS — F419 Anxiety disorder, unspecified: Secondary | ICD-10-CM | POA: Insufficient documentation

## 2022-07-21 DIAGNOSIS — K6389 Other specified diseases of intestine: Secondary | ICD-10-CM | POA: Insufficient documentation

## 2022-07-21 DIAGNOSIS — R195 Other fecal abnormalities: Secondary | ICD-10-CM

## 2022-07-21 DIAGNOSIS — Z1211 Encounter for screening for malignant neoplasm of colon: Secondary | ICD-10-CM | POA: Insufficient documentation

## 2022-07-21 DIAGNOSIS — F32A Depression, unspecified: Secondary | ICD-10-CM | POA: Insufficient documentation

## 2022-07-21 DIAGNOSIS — N138 Other obstructive and reflux uropathy: Secondary | ICD-10-CM

## 2022-07-21 DIAGNOSIS — K5730 Diverticulosis of large intestine without perforation or abscess without bleeding: Secondary | ICD-10-CM | POA: Insufficient documentation

## 2022-07-21 DIAGNOSIS — I10 Essential (primary) hypertension: Secondary | ICD-10-CM | POA: Diagnosis not present

## 2022-07-21 DIAGNOSIS — G4730 Sleep apnea, unspecified: Secondary | ICD-10-CM | POA: Insufficient documentation

## 2022-07-21 DIAGNOSIS — K640 First degree hemorrhoids: Secondary | ICD-10-CM | POA: Insufficient documentation

## 2022-07-21 DIAGNOSIS — Z980 Intestinal bypass and anastomosis status: Secondary | ICD-10-CM | POA: Diagnosis not present

## 2022-07-21 DIAGNOSIS — D126 Benign neoplasm of colon, unspecified: Secondary | ICD-10-CM | POA: Diagnosis not present

## 2022-07-21 HISTORY — PX: COLONOSCOPY WITH PROPOFOL: SHX5780

## 2022-07-21 SURGERY — COLONOSCOPY WITH PROPOFOL
Anesthesia: General

## 2022-07-21 MED ORDER — PROPOFOL 1000 MG/100ML IV EMUL
INTRAVENOUS | Status: AC
  Filled 2022-07-21: qty 100

## 2022-07-21 MED ORDER — PROPOFOL 500 MG/50ML IV EMUL
INTRAVENOUS | Status: DC | PRN
  Administered 2022-07-21: 140 ug/kg/min via INTRAVENOUS

## 2022-07-21 MED ORDER — LIDOCAINE HCL (CARDIAC) PF 100 MG/5ML IV SOSY
PREFILLED_SYRINGE | INTRAVENOUS | Status: DC | PRN
  Administered 2022-07-21: 50 mg via INTRAVENOUS

## 2022-07-21 MED ORDER — DEXMEDETOMIDINE HCL IN NACL 80 MCG/20ML IV SOLN
INTRAVENOUS | Status: AC
  Filled 2022-07-21: qty 20

## 2022-07-21 MED ORDER — SODIUM CHLORIDE 0.9 % IV SOLN: INTRAVENOUS | Status: DC

## 2022-07-21 MED ORDER — PROPOFOL 10 MG/ML IV BOLUS
INTRAVENOUS | Status: DC | PRN
  Administered 2022-07-21: 100 mg via INTRAVENOUS

## 2022-07-21 MED ORDER — LIDOCAINE HCL (PF) 2 % IJ SOLN
INTRAMUSCULAR | Status: AC
  Filled 2022-07-21: qty 5

## 2022-07-21 MED ORDER — PROPOFOL 10 MG/ML IV BOLUS
INTRAVENOUS | Status: AC
  Filled 2022-07-21: qty 20

## 2022-07-21 MED ORDER — DEXMEDETOMIDINE HCL IN NACL 200 MCG/50ML IV SOLN
INTRAVENOUS | Status: DC | PRN
  Administered 2022-07-21: 8 ug via INTRAVENOUS
  Administered 2022-07-21: 12 ug via INTRAVENOUS

## 2022-07-21 MED ORDER — SIMETHICONE 40 MG/0.6ML PO SUSP
ORAL | Status: DC | PRN
  Administered 2022-07-21: 60 mL via ORAL

## 2022-07-21 NOTE — Transfer of Care (Signed)
Immediate Anesthesia Transfer of Care Note  Patient: Joe Gutierrez  Procedure(s) Performed: COLONOSCOPY WITH PROPOFOL  Patient Location: PACU and Endoscopy Unit  Anesthesia Type:General  Level of Consciousness: drowsy and patient cooperative  Airway & Oxygen Therapy: Patient Spontanous Breathing  Post-op Assessment: Report given to RN and Post -op Vital signs reviewed and stable  Post vital signs: Reviewed and stable  Last Vitals:  Vitals Value Taken Time  BP 103/71 07/21/22 0829  Temp 36.2 C 07/21/22 0827  Pulse 73 07/21/22 0827  Resp 16 07/21/22 0829  SpO2 98 % 07/21/22 0829    Last Pain:  Vitals:   07/21/22 0827  TempSrc: Temporal  PainSc: Asleep         Complications: No notable events documented.

## 2022-07-21 NOTE — Anesthesia Postprocedure Evaluation (Signed)
Anesthesia Post Note  Patient: Joe Gutierrez  Procedure(s) Performed: COLONOSCOPY WITH PROPOFOL  Patient location during evaluation: PACU Anesthesia Type: General Level of consciousness: awake and alert Pain management: pain level controlled Vital Signs Assessment: post-procedure vital signs reviewed and stable Respiratory status: spontaneous breathing, nonlabored ventilation and respiratory function stable Cardiovascular status: blood pressure returned to baseline and stable Postop Assessment: no apparent nausea or vomiting Anesthetic complications: no   No notable events documented.   Last Vitals:  Vitals:   07/21/22 0837 07/21/22 0847  BP: 109/82 106/85  Pulse: 65 61  Resp: 17 12  Temp:    SpO2: 97% 98%    Last Pain:  Vitals:   07/21/22 0837  TempSrc:   PainSc: 0-No pain                 Foye Deer

## 2022-07-21 NOTE — H&P (Signed)
Wyline Mood, MD [address], [place], [place], [address] [address], [address], [place], [place], [address] Phone: [phone]  Fax: [phone]  Primary Care Physician:  Marjie Skiff, NP   Pre-Procedure History & Physical: HPI:  Joe Gutierrez is a 47 y.o. male is here for an colonoscopy.   Past Medical History:  Diagnosis Date   Anxiety    Arthritis    Colon polyp    Depression    Heart murmur    History of kidney stones    Hypertension    Hypogonadism in male    Obesity    Sleep apnea     Past Surgical History:  Procedure Laterality Date   APPENDECTOMY     CLOSED REDUCTION NASAL FRACTURE N/A 06/11/2021   Procedure: CLOSED REDUCTION NASAL FRACTURE;  Surgeon: Allena Napoleon, MD;  Location: [facility] OR;  Service: Plastics;  Laterality: N/A;   COLONOSCOPY N/A 06/17/2022   Procedure: COLONOSCOPY;  Surgeon: Wyline Mood, MD;  Location: [facility] ENDOSCOPY;  Service: Gastroenterology;  Laterality: N/A;   COLONOSCOPY WITH PROPOFOL N/A 11/16/2020   Procedure: COLONOSCOPY WITH PROPOFOL;  Surgeon: Wyline Mood, MD;  Location: [facility] ENDOSCOPY;  Service: Gastroenterology;  Laterality: N/A;   ESOPHAGOGASTRODUODENOSCOPY (EGD) WITH PROPOFOL N/A 06/17/2022   Procedure: ESOPHAGOGASTRODUODENOSCOPY (EGD) WITH PROPOFOL;  Surgeon: Wyline Mood, MD;  Location: [facility] ENDOSCOPY;  Service: Gastroenterology;  Laterality: N/A;   LAPAROSCOPIC RIGHT COLECTOMY N/A 01/24/2021   Procedure: LAPAROSCOPIC RIGHT COLECTOMY with Laqueta Due, PA-C assisting;  Surgeon: Leafy Ro, MD;  Location: [facility] ORS;  Service: General;  Laterality: N/A;   LITHOTRIPSY      Prior to Admission medications   Medication Sig Start Date End Date Taking? Authorizing Provider  buPROPion (WELLBUTRIN XL) 150 MG 24 hr tablet Take 1 tablet (150 mg total) by mouth daily. 12/09/21  Yes Cannady, Jolene T, NP  busPIRone (BUSPAR) 5 MG tablet Take 1 tablet (5 mg total) by mouth 2 (two) times daily. 12/09/21  Yes Cannady, Jolene  T, NP  lisinopril (ZESTRIL) 40 MG tablet TAKE 1 TABLET(40 MG) BY MOUTH DAILY 12/09/21  Yes Cannady, Jolene T, NP  sertraline (ZOLOFT) 100 MG tablet Take 2 tablets (200 mg total) by mouth daily. 12/09/21  Yes Cannady, Jolene T, NP  testosterone cypionate (DEPOTESTOSTERONE CYPIONATE) 200 MG/ML injection Inject 1 mL (200 mg total) into the muscle every 14 (fourteen) days. 07/10/22  Yes McGowan, Carollee Herter A, PA-C  Vitamin D, Cholecalciferol, 25 MCG (1000 UT) TABS Take 1,000 Units by mouth daily.   Yes [provider]  albuterol (VENTOLIN HFA) 108 (90 Base) MCG/ACT inhaler INHALE 2 PUFFS BY MOUTH EVERY 6 HOURS AS NEEDED FOR WHEEZE OR SHORTNESS OF BREATH 04/11/22   Cannady, Jolene T, NP  Na Sulfate-K Sulfate-Mg Sulf 17.5-3.13-1.6 GM/177ML SOLN PLEASE SEE ATTACHED FOR DETAILED DIRECTIONS 05/19/22   [provider]  NEEDLE, DISP, 18 G (BD DISP NEEDLES) 18G X 1-1/2" MISC 1 mg by Does not apply route every 14 (fourteen) days. 01/27/22   Michiel Cowboy A, PA-C  NEEDLE, DISP, 21 G (BD DISP NEEDLES) 21G X 1-1/2" MISC 1 mg by Does not apply route every 14 (fourteen) days. 01/27/22   Michiel Cowboy A, PA-C  Syringe, Disposable, (2-3CC SYRINGE) 3 ML MISC 1 mg by Does not apply route every 14 (fourteen) days. 01/27/22   Michiel Cowboy A, PA-C    Allergies as of 06/18/2022 - Review Complete 06/17/2022  Allergen Reaction Noted   Codeine Hives 01/24/2015  Sulfa antibiotics Hives 01/24/2015    Family History  Problem Relation Age of Onset   Diabetes Mother    Hypertension Mother    Alcohol abuse Father    Heart disease Father    Hypertension Father    Diabetes Maternal Grandmother    Stroke Maternal Grandfather    Hypertension Brother    Asthma Daughter    Heart disease Paternal Grandfather     Social History   Socioeconomic History   Marital status: Married    Spouse name: Tabatha   Number of children: 3   Years of education: Not on file   Highest education level: Not on file   Occupational History   Not on file  Tobacco Use   Smoking status: Never   Smokeless tobacco: Never  Vaping Use   Vaping Use: Never used  Substance and Sexual Activity   Alcohol use: No   Drug use: No   Sexual activity: Yes  Other Topics Concern   Not on file  Social History Narrative   Not on file   Social Determinants of Health   Financial Resource Strain: Low Risk  (11/26/2020)   Overall Financial Resource Strain (CARDIA)    Difficulty of Paying Living Expenses: Not hard at all  Food Insecurity: No Food Insecurity (11/26/2020)   Hunger Vital Sign    Worried About Running Out of Food in the Last Year: Never true    Ran Out of Food in the Last Year: Never true  Transportation Needs: No Transportation Needs (11/26/2020)   PRAPARE - Administrator, Civil Service (Medical): No    Lack of Transportation (Non-Medical): No  Physical Activity: Sufficiently Active (11/26/2020)   Exercise Vital Sign    Days of Exercise per Week: 5 days    Minutes of Exercise per Session: 30 min  Stress: Stress Concern Present (11/26/2020)   [employer] of Occupational Health - Occupational Stress Questionnaire    Feeling of Stress : To some extent  Social Connections: Moderately Integrated (11/26/2020)   Social Connection and Isolation Panel [NHANES]    Frequency of Communication with Friends and Family: Three times a week    Frequency of Social Gatherings with Friends and Family: Three times a week    Attends Religious Services: More than 4 times per year    Active Member of Clubs or Organizations: No    Attends Banker Meetings: Never    Marital Status: Married  Catering manager Violence: Not At Risk (11/26/2020)   Humiliation, Afraid, Rape, and Kick questionnaire    Fear of Current or Ex-Partner: No    Emotionally Abused: No    Physically Abused: No    Sexually Abused: No    Review of Systems: See HPI, otherwise negative ROS  Physical Exam: BP (!)  125/102   Pulse 73   Temp 97.6 F (36.4 C) (Temporal)   Resp 16   Ht 5\' 10"  (1.778 m)   Wt 114.4 kg   SpO2 98%   BMI 36.19 kg/m  General:   Alert,  pleasant and cooperative in NAD Head:  Normocephalic and atraumatic. Neck:  Supple; no masses or thyromegaly. Lungs:  Clear throughout to auscultation, normal respiratory effort.    Heart:  +S1, +S2, Regular rate and rhythm, No edema. Abdomen:  Soft, nontender and nondistended. Normal bowel sounds, without guarding, and without rebound.   Neurologic:  Alert and  oriented x4;  grossly normal neurologically.  Impression/Plan: Joe Gutierrez is here for  an colonoscopy to be performed for positive stool for blood   Risks, benefits, limitations, and alternatives regarding  colonoscopy have been reviewed with the patient.  Questions have been answered.  All parties agreeable.   Wyline Mood, MD  07/21/2022, 8:04 AM]

## 2022-07-21 NOTE — Op Note (Signed)
[facility] Gastroenterology Patient Name: Priscilla Achee Procedure Date: 07/21/2022 7:17 AM MRN: [id number] Account #: [id number] Date of Birth: [birth year] Admit Type: Outpatient Age: 47 Room: [facility] ENDO ROOM 1 Gender: Male Note Status: Finalized Instrument Name: Nelda Marseille [id number] Procedure:             Colonoscopy Indications:           Screening for colorectal malignant neoplasm due to                         positive fecal occult blood test Providers:             Wyline Mood MD, MD Referring MD:          Wyline Mood MD, MD (Referring MD), Dorie Rank. Harvest Dark                         (Referring MD) Medicines:             Monitored Anesthesia Care Complications:         No immediate complications. Procedure:             Pre-Anesthesia Assessment:                        - Prior to the procedure, a History and Physical was                         performed, and patient medications, allergies and                         sensitivities were reviewed. The patient's tolerance                         of previous anesthesia was reviewed.                        - The risks and benefits of the procedure and the                         sedation options and risks were discussed with the                         patient. All questions were answered and informed                         consent was obtained.                        - ASA Grade Assessment: II - A patient with mild                         systemic disease.                        After obtaining informed consent, the colonoscope was                         passed under direct vision. Throughout the procedure,                         the patient's  blood pressure, pulse, and oxygen                         saturations were monitored continuously. The                         Colonoscope was introduced through the anus and                         advanced to the the ileocolonic anastomosis. The                          colonoscopy was performed with ease. The patient                         tolerated the procedure well. The quality of the bowel                         preparation was good. Anatomical landmarks were                         photographed. Findings:      A localized area of mildly nodular mucosa was found at the anastomosis.       The polyp was removed with a cold snare. Resection and retrieval were       complete.      Multiple small-mouthed diverticula were found in the sigmoid colon.      Non-bleeding internal hemorrhoids were found during retroflexion. The       hemorrhoids were medium-sized and Grade I (internal hemorrhoids that do       not prolapse).      The exam was otherwise without abnormality on direct and retroflexion       views.      There was evidence of a prior end-to-side ileo-colonic anastomosis in       the transverse colon. This was patent and was characterized by healthy       appearing mucosa. Impression:            - Nodular mucosa at the colonic anastomosis.                        - Diverticulosis in the sigmoid colon.                        - Non-bleeding internal hemorrhoids.                        - The examination was otherwise normal on direct and                         retroflexion views. Recommendation:        - Discharge patient to home [Means].                        - Advance diet as tolerated.                        - Continue present medications.                        - Await pathology  results.                        - Repeat colonoscopy in 5 years for screening purposes. Procedure Code(s):     --- Professional ---                        [phone], Colonoscopy, flexible; with removal of                         tumor(s), polyp(s), or other lesion(s) by snare                         technique Diagnosis Code(s):     --- Professional ---                        Z12.11, Encounter for screening for malignant neoplasm                         of colon                         R19.5, Other fecal abnormalities                        K63.89, Other specified diseases of intestine                        K64.0, First degree hemorrhoids                        K57.30, Diverticulosis of large intestine without                         perforation or abscess without bleeding CPT copyright 2022 American Medical Association. All rights reserved. The codes documented in this report are preliminary and upon coder review may  be revised to meet current compliance requirements. Wyline Mood, MD Wyline Mood MD, MD 07/21/2022 8:25:26 AM This report has been signed electronically. Number of Addenda: 0 Note Initiated On: 07/21/2022 7:17 AM Scope Withdrawal Time: 0 hours 10 minutes 45 seconds  Total Procedure Duration: 0 hours 12 minutes 23 seconds  Estimated Blood Loss:  Estimated blood loss: none. Estimated blood loss: none.      [facility]

## 2022-07-21 NOTE — Anesthesia Preprocedure Evaluation (Addendum)
Anesthesia Evaluation  Patient identified by MRN, date of birth, ID band Patient awake    Reviewed: Allergy & Precautions, NPO status , Patient's Chart, lab work & pertinent test results  History of Anesthesia Complications Negative for: history of anesthetic complications  Airway Mallampati: II  TM Distance: >3 FB Neck ROM: Full    Dental no notable dental hx. (+) Teeth Intact   Pulmonary sleep apnea , neg COPD, Patient abstained from smoking.Not current smoker   Pulmonary exam normal breath sounds clear to auscultation       Cardiovascular Exercise Tolerance: Good METShypertension, Pt. on medications (-) CAD and (-) Past MI (-) dysrhythmias  Rhythm:Regular Rate:Normal - Systolic murmurs    Neuro/Psych  PSYCHIATRIC DISORDERS Anxiety Depression    negative neurological ROS     GI/Hepatic ,neg GERD  ,,(+)     (-) substance abuse    Endo/Other  neg diabetes    Renal/GU negative Renal ROS     Musculoskeletal   Abdominal  (+) + obese  Peds  Hematology   Anesthesia Other Findings Past Medical History: No date: Anxiety No date: Arthritis No date: Colon polyp No date: Depression No date: Heart murmur No date: History of kidney stones No date: Hypertension No date: Hypogonadism in male No date: Obesity No date: Sleep apnea  Reproductive/Obstetrics                             Anesthesia Physical Anesthesia Plan  ASA: 2  Anesthesia Plan: General   Post-op Pain Management: Minimal or no pain anticipated   Induction: Intravenous  PONV Risk Score and Plan: 2 and Propofol infusion, TIVA and Ondansetron  Airway Management Planned: Nasal Cannula  Additional Equipment: None  Intra-op Plan:   Post-operative Plan:   Informed Consent: I have reviewed the patients History and Physical, chart, labs and discussed the procedure including the risks, benefits and alternatives for the  proposed anesthesia with the patient or authorized representative who has indicated his/her understanding and acceptance.     Dental advisory given  Plan Discussed with: CRNA and Surgeon  Anesthesia Plan Comments:        Anesthesia Quick Evaluation

## 2022-07-22 ENCOUNTER — Encounter: Payer: Self-pay | Admitting: Gastroenterology

## 2022-07-29 ENCOUNTER — Other Ambulatory Visit: Payer: BLUE CROSS/BLUE SHIELD

## 2022-07-29 ENCOUNTER — Other Ambulatory Visit
Admission: RE | Admit: 2022-07-29 | Discharge: 2022-07-29 | Disposition: A | Discharge status: Home / Self Care | Payer: Managed Care, Other (non HMO) | Attending: Urology | Admitting: Urology

## 2022-07-29 DIAGNOSIS — N138 Other obstructive and reflux uropathy: Secondary | ICD-10-CM | POA: Diagnosis present

## 2022-07-29 DIAGNOSIS — N401 Enlarged prostate with lower urinary tract symptoms: Secondary | ICD-10-CM | POA: Insufficient documentation

## 2022-07-29 LAB — PSA: Prostatic Specific Antigen: 1.81 ng/mL (ref 0.00–4.00)

## 2022-07-29 NOTE — Progress Notes (Unsigned)
07/30/22 11:09 AM   Joe Gutierrez 47-Sep-1977 [id number]  Referring provider:  Marjie Skiff, NP [address] [place],  [place] [address]  Urological history  1. Testosterone deficiency -contributing factors of age, obesity and sleep apnea -testosterone level (06/2022) 500 -hemoglobin/HCT (06/2022) 14.7/43.4 -testosterone cypionate 200 mg/mL, 1 cc every 14 days   2.  Erectile dysfunction -contributing factors of age, sleep apnea, HTN, BPH, HLD and testosterone deficiency  3. Prostate cancer screening -PSA (07/2022) 1.81  4. BPH w/ LU TS -Contributing factors of age, hypertension, sleep apnea, depression, anxiety and obesity  5. Hydroceles -scrotal US (01/2022) small bilateral hydroceles  6. Varicoceles -scrotal US (01/2022) small bilateral varicoceles   Chief Complaint  Patient presents with   Hypogonadism    HPI: Joe Gutierrez is a 47 y.o.male who presents today for follow-up.  Previous records reviewed.   I PSS 2/1  No urinary complaints.  Patient denies any modifying or aggravating factors.  Patient denies any gross hematuria, dysuria or suprapubic/flank pain.  Patient denies any fevers, chills, nausea or vomiting.     IPSS     Row Name 07/30/22 1000         International Prostate Symptom Score   How often have you had the sensation of not emptying your bladder? Not at All     How often have you had to urinate less than every two hours? Less than 1 in 5 times     How often have you found you stopped and started again several times when you urinated? Not at All     How often have you found it difficult to postpone urination? Not at All     How often have you had a weak urinary stream? Not at All     How often have you had to strain to start urination? Not at All     How many times did you typically get up at night to urinate? 1 Time     Total IPSS Score 2       Quality of Life due to urinary symptoms   If you were to spend the rest of your life with  your urinary condition just the way it is now how would you feel about that? Pleased                Score:  1-7 Mild 8-19 Moderate 20-35 Severe  SHIM 18  Patient still having spontaneous erections.  He denies any pain or curvature with erections.   He recently stopped all his medications for couple weeks because he was having issues with delayed or inability to ejaculate.  When he stopped all his medications this improved.   SHIM     Row Name 07/30/22 1048         SHIM: Over the last 6 months:   How do you rate your confidence that you could get and keep an erection? Moderate     When you had erections with sexual stimulation, how often were your erections hard enough for penetration (entering your partner)? Almost Always or Always     During sexual intercourse, how often were you able to maintain your erection after you had penetrated (entered) your partner? Most Times (much more than half the time)     During sexual intercourse, how difficult was it to maintain your erection to completion of intercourse? Slightly Difficult     When you attempted sexual intercourse, how often was it satisfactory for you? A  Few Times (much less than half the time)       SHIM Total Score   SHIM 18             Score: 1-7 Severe ED 8-11 Moderate ED 12-16 Mild-Moderate ED 17-21 Mild ED 22-25 No ED   PMH: Past Medical History:  Diagnosis Date   Anxiety    Arthritis    Colon polyp    Depression    Heart murmur    History of kidney stones    Hypertension    Hypogonadism in male    Obesity    Sleep apnea     Surgical History: Past Surgical History:  Procedure Laterality Date   APPENDECTOMY     CLOSED REDUCTION NASAL FRACTURE N/A 06/11/2021   Procedure: CLOSED REDUCTION NASAL FRACTURE;  Surgeon: Allena Napoleon, MD;  Location: [facility] OR;  Service: Plastics;  Laterality: N/A;   COLONOSCOPY N/A 06/17/2022   Procedure: COLONOSCOPY;  Surgeon: Wyline Mood, MD;  Location: [facility]  ENDOSCOPY;  Service: Gastroenterology;  Laterality: N/A;   COLONOSCOPY WITH PROPOFOL N/A 11/16/2020   Procedure: COLONOSCOPY WITH PROPOFOL;  Surgeon: Wyline Mood, MD;  Location: [facility] ENDOSCOPY;  Service: Gastroenterology;  Laterality: N/A;   COLONOSCOPY WITH PROPOFOL N/A 07/21/2022   Procedure: COLONOSCOPY WITH PROPOFOL;  Surgeon: Wyline Mood, MD;  Location: [facility] ENDOSCOPY;  Service: Gastroenterology;  Laterality: N/A;   ESOPHAGOGASTRODUODENOSCOPY (EGD) WITH PROPOFOL N/A 06/17/2022   Procedure: ESOPHAGOGASTRODUODENOSCOPY (EGD) WITH PROPOFOL;  Surgeon: Wyline Mood, MD;  Location: [facility] ENDOSCOPY;  Service: Gastroenterology;  Laterality: N/A;   LAPAROSCOPIC RIGHT COLECTOMY N/A 01/24/2021   Procedure: LAPAROSCOPIC RIGHT COLECTOMY with Laqueta Due, PA-C assisting;  Surgeon: Leafy Ro, MD;  Location: [facility] ORS;  Service: General;  Laterality: N/A;   LITHOTRIPSY      Home Medications:  Allergies as of 07/30/2022       Reactions   Codeine Hives   Sulfa Antibiotics Hives        Medication List        Accurate as of July 30, 2022 11:09 AM. If you have any questions, ask your nurse or doctor.          STOP taking these medications    albuterol 108 (90 Base) MCG/ACT inhaler Commonly known as: VENTOLIN HFA Stopped by: Michell Giuliano, PA-C       TAKE these medications    2-3CC SYRINGE 3 ML Misc 1 mg by Does not apply route every 14 (fourteen) days.   BD Disp Needles 18G X 1-1/2" Misc Generic drug: NEEDLE (DISP) 18 G 1 mg by Does not apply route every 14 (fourteen) days.   BD Disp Needles 21G X 1-1/2" Misc Generic drug: NEEDLE (DISP) 21 G 1 mg by Does not apply route every 14 (fourteen) days.   buPROPion 150 MG 24 hr tablet Commonly known as: Wellbutrin XL Take 1 tablet (150 mg total) by mouth daily.   busPIRone 5 MG tablet Commonly known as: BUSPAR Take 1 tablet (5 mg total) by mouth 2 (two) times daily.   cyproheptadine 4 MG tablet Commonly known as: PERIACTIN Take  one tablet 3 hours prior to intercourse Started by: Marcell Chavarin, PA-C   lisinopril 40 MG tablet Commonly known as: ZESTRIL TAKE 1 TABLET(40 MG) BY MOUTH DAILY   Na Sulfate-K Sulfate-Mg Sulf 17.5-3.13-1.6 GM/177ML Soln PLEASE SEE ATTACHED FOR DETAILED DIRECTIONS   sertraline 100 MG tablet Commonly known as: ZOLOFT Take 2 tablets (200 mg total) by mouth daily.   testosterone cypionate 200  MG/ML injection Commonly known as: DEPOTESTOSTERONE CYPIONATE Inject 1 mL (200 mg total) into the muscle every 14 (fourteen) days.   Vitamin D (Cholecalciferol) 25 MCG (1000 UT) Tabs Take 1,000 Units by mouth daily.        Allergies:  Allergies  Allergen Reactions   Codeine Hives   Sulfa Antibiotics Hives    Family History: Family History  Problem Relation Age of Onset   Diabetes Mother    Hypertension Mother    Alcohol abuse Father    Heart disease Father    Hypertension Father    Diabetes Maternal Grandmother    Stroke Maternal Grandfather    Hypertension Brother    Asthma Daughter    Heart disease Paternal Grandfather     Social History:  reports that he has never smoked. He has never used smokeless tobacco. He reports that he does not drink alcohol and does not use drugs.   Physical Exam: BP 129/88   Pulse 70   Ht 5\' 10"  (1.778 m)   Wt 250 lb (113.4 kg)   BMI 35.87 kg/m   Constitutional:  Well nourished. Alert and oriented, No acute distress. HEENT:  AT, moist mucus membranes.  Trachea midline Cardiovascular: No clubbing, cyanosis, or edema. Respiratory: Normal respiratory effort, no increased work of breathing. Neurologic: Grossly intact, no focal deficits, moving all 4 extremities. Psychiatric: Normal mood and affect.   Laboratory Data: Results for orders placed or performed during the hospital encounter of 07/29/22  PSA  Result Value Ref Range   Prostatic Specific Antigen 1.81 0.00 - 4.00 ng/mL  I have reviewed the labs.   Pertinent  Imaging N/A  Assessment & Plan:    1. Testosterone deficiency - Testosterone level is therapeutic  - hemoglobin/hematocrit: normal -continue testosterone cypionate 200 mg/mL, 1 cc every 14 days  2.  Erectile dysfunction - slight improvement in function, but still having issues for low libido   3. BPH with LU TS  - PSA stable - continue annual screening  4.  Ejaculatory disorder -Advised him not to discontinue his medications -Advised him that issues with ejaculation is a common side effect while on mood stabilizer medication -he should talk with Mrs. Cannady, NP about possible alternatives -in the meantime, he can try cyproheptadine 4 mg tablet, 3 hours prior to intercourse   Return in about 6 months (around 01/29/2023) for PSA, testosterone (one week after injection) H & H, IPSS, SHIM and exam.   Michiel Cowboy, PA-C  [facility] [address] [place], [place] [address] [phone]

## 2022-07-30 ENCOUNTER — Ambulatory Visit: Payer: Managed Care, Other (non HMO) | Admitting: Urology

## 2022-07-30 ENCOUNTER — Encounter: Payer: Self-pay | Admitting: Urology

## 2022-07-30 VITALS — BP 129/88 | HR 70 | Ht 70.0 in | Wt 250.0 lb

## 2022-07-30 DIAGNOSIS — N401 Enlarged prostate with lower urinary tract symptoms: Secondary | ICD-10-CM | POA: Diagnosis not present

## 2022-07-30 DIAGNOSIS — N138 Other obstructive and reflux uropathy: Secondary | ICD-10-CM

## 2022-07-30 DIAGNOSIS — N5319 Other ejaculatory dysfunction: Secondary | ICD-10-CM | POA: Diagnosis not present

## 2022-07-30 DIAGNOSIS — N529 Male erectile dysfunction, unspecified: Secondary | ICD-10-CM | POA: Diagnosis not present

## 2022-07-30 DIAGNOSIS — E291 Testicular hypofunction: Secondary | ICD-10-CM | POA: Diagnosis not present

## 2022-07-30 MED ORDER — CYPROHEPTADINE HCL 4 MG PO TABS
ORAL_TABLET | ORAL | 3 refills | Status: DC
Start: 2022-07-30 — End: 2023-06-15

## 2022-07-30 NOTE — Addendum Note (Signed)
Addended by: Honor Loh on: 07/30/2022 11:15 AM   Modules accepted: Orders

## 2022-08-06 NOTE — Patient Instructions (Addendum)
Add on Pepcid 20 MG in afternoon.  Try cutting back on Sertraline to 150 MG (1 1/2 tablets) for one week and then decrease to 100 MG (one tablet) if tolerating.  Abdominal Pain, Adult  Many things can cause belly (abdominal) pain. In most cases, belly pain is not a serious problem and can be watched and treated at home. But in some cases, it can be serious. Your doctor will try to find the cause of your belly pain. Follow these instructions at home: Medicines Take over-the-counter and prescription medicines only as told by your doctor. Do not take medicines that help you poop (laxatives) unless told by your doctor. General instructions Watch your belly pain for any changes. Tell your doctor if the pain gets worse. Drink enough fluid to keep your pee (urine) pale yellow. Contact a doctor if: Your belly pain changes or gets worse. You have very bad cramping or bloating in your belly. You vomit. Your pain gets worse with meals, after eating, or with certain foods. You have trouble pooping or have watery poop for more than 2-3 days. You are not hungry, or you lose weight without trying. You have signs of not getting enough fluid or water (dehydration). These may include: Dark pee, very little pee, or no pee. Cracked lips or dry mouth. Feeling sleepy or weak. You have pain when you pee or poop. Your belly pain wakes you up at night. You have blood in your pee. You have a fever. Get help right away if: You cannot stop vomiting. Your pain is only in one part of your belly, like on the right side. You have bloody or black poop, or poop that looks like tar. You have trouble breathing. You have chest pain. These symptoms may be an emergency. Get help right away. Call 911. Do not wait to see if the symptoms will go away. Do not drive yourself to the hospital. This information is not intended to replace advice given to you by your health care provider. Make sure you discuss any questions  you have with your health care provider. Document Revised: 11/20/2021 Document Reviewed: 11/20/2021 Elsevier Patient Education  2024 [employer].

## 2022-08-08 ENCOUNTER — Ambulatory Visit (INDEPENDENT_AMBULATORY_CARE_PROVIDER_SITE_OTHER): Payer: Managed Care, Other (non HMO) | Admitting: Nurse Practitioner

## 2022-08-08 ENCOUNTER — Encounter: Payer: Self-pay | Admitting: Nurse Practitioner

## 2022-08-08 VITALS — BP 119/81 | HR 71 | Temp 98.0°F | Ht 70.0 in | Wt 257.0 lb

## 2022-08-08 DIAGNOSIS — R21 Rash and other nonspecific skin eruption: Secondary | ICD-10-CM | POA: Diagnosis not present

## 2022-08-08 DIAGNOSIS — K269 Duodenal ulcer, unspecified as acute or chronic, without hemorrhage or perforation: Secondary | ICD-10-CM | POA: Diagnosis not present

## 2022-08-08 DIAGNOSIS — R1013 Epigastric pain: Secondary | ICD-10-CM

## 2022-08-08 MED ORDER — FLUCONAZOLE 150 MG PO TABS: ORAL_TABLET | ORAL | 0 refills | Status: DC

## 2022-08-08 NOTE — Assessment & Plan Note (Signed)
Refer to abdominal pain plan of care. ?

## 2022-08-08 NOTE — Assessment & Plan Note (Signed)
History of similar and saw dermatology in past.  Tinea in appearance -- he reports being told it was ring worm.  Will send in Diflucan to take one weekly for 3 weeks and start OTC anti fungal cream to areas.  Return in 4 weeks for follow-up.

## 2022-08-08 NOTE — Assessment & Plan Note (Addendum)
Chronic, diagnosed with duodenal ulcers by GI recently and taking Omeprazole which assisted at first, but now pain returning.  Will add on Pepcid 20 MG in evening to see if benefit.  Sent message to Dr. Tobi Bastos to alert them of ongoing discomfort and obtain recommendations.  Highly recommend he focus on more bland diet at home.  Obtain H Pylori testing.

## 2022-08-08 NOTE — Progress Notes (Signed)
BP 119/81   Pulse 71   Temp 98 F (36.7 C) (Oral)   Ht 5\' 10"  (1.778 m)   Wt 257 lb (116.6 kg)   SpO2 93%   BMI 36.88 kg/m    Subjective:    Patient ID: Joe Gutierrez, male    DOB: [birth year], 47 y.o.   MRN: [id number]  HPI: [name] is a 47 y.o. male  Chief Complaint  Patient presents with   Abdominal Pain    Patient states that the pain has gotten some better   Reports urology asked him to talk to PCP about medications that can cause issues difficulty with ejaculation.  ABDOMINAL PAIN  Follow-up for abdominal pain today, initially seen 06/09/22 for this.  Saw GI on 05/19/22 for rectal bleeding -- he was recommended to stop drinking sodas.  Had EGD and colonoscopy with them on 06/17/22, with repeat colonoscopy on 07/21/22 due to poor prep. They did find duodenal ulcers and was treated with Omeprazole 40 MG for 8 weeks.  During treatment pain felt better, but now pain has returned.  Notices discomfort after eating.  Occasional bloating presenting.     Duration: months Onset: gradual Severity: 4-5/10 Quality: cramping and throbbing, burning Location:  LUQ, RUQ, and epigastric  Episode duration: 30 minutes to one hour Radiation: no Frequency: intermittent Alleviating factors: Omeprazole Aggravating factors: after you eating and activity Status: fluctuating Treatments attempted: Omeprazole Fever: no Nausea: no Vomiting: no Weight loss: no Decreased appetite: no Diarrhea: yes since his past surgery Constipation: no Blood in stool: none Heartburn: improved Jaundice: no Rash: no Dysuria/urinary frequency: no Hematuria: no History of sexually transmitted disease: no Recurrent NSAID use: no   RASH Present on and off.  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 Notices this more in summer. Duration:  chronic  Location: trunk and legs  Itching: no Burning: no Redness: yes Oozing: no Scaling: yes Blisters: no Painful: no Fevers: no Change in  detergents/soaps/personal care products: no Recent illness: no Recent travel:no History of same: no Context: fluctuating Alleviating factors: nothing Treatments attempted:nothing Shortness of breath: no  Throat/tongue swelling: no Myalgias/arthralgias: no   Relevant past medical, surgical, family and social history reviewed and updated as indicated. Interim medical history since our last visit reviewed. Allergies and medications reviewed and updated.  Review of Systems  Constitutional:  Negative for activity change, diaphoresis, fatigue and fever.  Respiratory:  Negative for cough, chest tightness, shortness of breath and wheezing.   Cardiovascular:  Negative for chest pain, palpitations and leg swelling.  Gastrointestinal:  Positive for abdominal pain and diarrhea. Negative for abdominal distention, blood in stool, constipation, nausea, rectal pain and vomiting.  Endocrine: Negative.   Skin:  Positive for rash.  Neurological: Negative.   Psychiatric/Behavioral:  Negative for decreased concentration, self-injury, sleep disturbance and suicidal ideas. The patient is not nervous/anxious.    Per HPI unless specifically indicated above     Objective:    BP 119/81   Pulse 71   Temp 98 F (36.7 C) (Oral)   Ht 5\' 10"  (1.778 m)   Wt 257 lb (116.6 kg)   SpO2 93%   BMI 36.88 kg/m   Wt Readings from Last 3 Encounters:  08/08/22 257 lb (116.6 kg)  07/30/22 250 lb (113.4 kg)  07/21/22 252 lb 3.2 oz (114.4 kg)    Physical Exam Vitals and nursing note reviewed.  Constitutional:      General: He is awake. He is not in  acute distress.    Appearance: He is well-developed, well-groomed and overweight. He is not ill-appearing.  HENT:     Head: Normocephalic and atraumatic.     Right Ear: Hearing normal. No drainage.     Left Ear: Hearing normal. No drainage.  Eyes:     General: Lids are normal.        Right eye: No discharge.        Left eye: No discharge.     Conjunctiva/sclera:  Conjunctivae normal.     Pupils: Pupils are equal, round, and reactive to light.  Neck:     Vascular: No carotid bruit.  Cardiovascular:     Rate and Rhythm: Normal rate and regular rhythm.     Heart sounds: Normal heart sounds, S1 normal and S2 normal. No murmur heard.    No gallop.  Pulmonary:     Effort: Pulmonary effort is normal. No accessory muscle usage or respiratory distress.     Breath sounds: Normal breath sounds.  Abdominal:     General: Bowel sounds are normal. There is no distension.     Palpations: Abdomen is soft.     Tenderness: There is no abdominal tenderness.     Hernia: No hernia is present.  Musculoskeletal:     Cervical back: Normal range of motion and neck supple.     Right knee: Normal.     Left knee: Normal.     Right lower leg: No edema.     Left lower leg: No edema.  Skin:    General: Skin is warm and dry.     Findings: Rash present. Rash is scaling.     Comments: Rashes to both upper thighs and lower back bilaterally -- with erythema exterior and scaling, paler internal.  External mild raised appearance.  Neurological:     Mental Status: He is alert and oriented to person, place, and time.     Deep Tendon Reflexes: Reflexes are normal and symmetric.     Reflex Scores:      Brachioradialis reflexes are 2+ on the right side and 2+ on the left side.      Patellar reflexes are 2+ on the right side and 2+ on the left side. Psychiatric:        Attention and Perception: Attention normal.        Mood and Affect: Mood normal.        Speech: Speech normal.        Behavior: Behavior normal. Behavior is cooperative.    Results for orders placed or performed during the hospital encounter of 07/29/22  PSA  Result Value Ref Range   Prostatic Specific Antigen 1.81 0.00 - 4.00 ng/mL      Assessment & Plan:   Problem List Items Addressed This Visit       Digestive   Duodenal ulcer    Refer to abdominal pain plan of care.      Relevant Orders   H.  pylori antigen, stool     Musculoskeletal and Integument   Rash    History of similar and saw dermatology in past.  Tinea in appearance -- he reports being told it was ring worm.  Will send in Diflucan to take one weekly for 3 weeks and start OTC anti fungal cream to areas.  Return in 4 weeks for follow-up.         Other   Abdominal pain - Primary    Chronic, diagnosed with duodenal ulcers by GI  recently and taking Omeprazole which assisted at first, but now pain returning.  Will add on Pepcid 20 MG in evening to see if benefit.  Sent message to Dr. Tobi Bastos to alert them of ongoing discomfort and obtain recommendations.  Highly recommend he focus on more bland diet at home.  Obtain H Pylori testing.      Relevant Orders   H. pylori antigen, stool     Follow up plan: Return in about 4 weeks (around 09/05/2022) for Abdominal pain.

## 2022-08-16 LAB — H. PYLORI ANTIGEN, STOOL: H pylori Ag, Stl: NEGATIVE

## 2022-08-16 NOTE — Progress Notes (Signed)
Contacted via MyChart   Good news, H Pylori is negative:)

## 2022-08-31 NOTE — Patient Instructions (Signed)
Abdominal Pain, Adult  Many things can cause belly (abdominal) pain. In most cases, belly pain is not a serious problem and can be watched and treated at home. But in some cases, it can be serious. Your doctor will try to find the cause of your belly pain. Follow these instructions at home: Medicines Take over-the-counter and prescription medicines only as told by your doctor. Do not take medicines that help you poop (laxatives) unless told by your doctor. General instructions Watch your belly pain for any changes. Tell your doctor if the pain gets worse. Drink enough fluid to keep your pee (urine) pale yellow. Contact a doctor if: Your belly pain changes or gets worse. You have very bad cramping or bloating in your belly. You vomit. Your pain gets worse with meals, after eating, or with certain foods. You have trouble pooping or have watery poop for more than 2-3 days. You are not hungry, or you lose weight without trying. You have signs of not getting enough fluid or water (dehydration). These may include: Dark pee, very little pee, or no pee. Cracked lips or dry mouth. Feeling sleepy or weak. You have pain when you pee or poop. Your belly pain wakes you up at night. You have blood in your pee. You have a fever. Get help right away if: You cannot stop vomiting. Your pain is only in one part of your belly, like on the right side. You have bloody or black poop, or poop that looks like tar. You have trouble breathing. You have chest pain. These symptoms may be an emergency. Get help right away. Call 911. Do not wait to see if the symptoms will go away. Do not drive yourself to the hospital. This information is not intended to replace advice given to you by your health care provider. Make sure you discuss any questions you have with your health care provider. Document Revised: 11/20/2021 Document Reviewed: 11/20/2021 Elsevier Patient Education  2024 [employer] Inc[employer]  

## 2022-09-05 ENCOUNTER — Ambulatory Visit (INDEPENDENT_AMBULATORY_CARE_PROVIDER_SITE_OTHER): Payer: Managed Care, Other (non HMO) | Admitting: Nurse Practitioner

## 2022-09-05 ENCOUNTER — Encounter: Payer: Self-pay | Admitting: Nurse Practitioner

## 2022-09-05 VITALS — BP 129/86 | HR 72 | Temp 97.7°F | Ht 70.0 in | Wt 258.0 lb

## 2022-09-05 DIAGNOSIS — R1013 Epigastric pain: Secondary | ICD-10-CM | POA: Diagnosis not present

## 2022-09-05 DIAGNOSIS — K269 Duodenal ulcer, unspecified as acute or chronic, without hemorrhage or perforation: Secondary | ICD-10-CM | POA: Diagnosis not present

## 2022-09-05 MED ORDER — VOQUEZNA 20 MG PO TABS: 20.0000 mg | ORAL_TABLET | Freq: Every day | ORAL | 5 refills | Status: DC

## 2022-09-05 NOTE — Assessment & Plan Note (Signed)
Chronic, diagnosed with duodenal ulcers by GI in April 2024 and taking Omeprazole which assisted at first, and Pepcid daily.  Pain is ongoing with bloating after eating.  Consulted with Dr. Tobi Bastos via secure chat, will start Voquenza 20 MG daily and stop Omeprazole and Pepcid.  Discussed with patient if too costly to go to GI office Monday for samples to trial.  Continue collaboration with GI, appreciate their input.

## 2022-09-05 NOTE — Progress Notes (Signed)
BP 129/86   Pulse 72   Temp 97.7 F (36.5 C) (Oral)   Ht 5\' 10"  (1.778 m)   Wt 258 lb (117 kg)   SpO2 97%   BMI 37.02 kg/m    Subjective:    Patient ID: [name], male    DOB: [birth year], 47 y.o.   MRN: [id number]  HPI: Joe Gutierrez is a 47 y.o. male  Chief Complaint  Patient presents with   Abdominal Pain    Here for follow up from last visit   ABDOMINAL PAIN  Follow-up for abdominal pain today.  Initial visit 06/09/22 for this.  Saw GI on 05/19/22 for rectal bleeding -- recommended to stop drinking sodas -- he has cut back a lot.  Had EGD and colonoscopy with them on 06/17/22, with repeat colonoscopy on 07/21/22 due to poor prep first time.   He continues on Omeprazole 40 MG daily (initially started due to duodenal ulcers diagnosed by GI) and Pepcid 20 MG daily at 1 PM.   Pain is still present even with Pepcid added on, a little better but not 100%.  Continues to notice discomfort after eating + bloating. Duration: months Onset: gradual Severity: 4-5/10 Quality: cramping and throbbing, burning Location:  LUQ, RUQ, and epigastric  Episode duration: 30 minutes to one hour Radiation: no Frequency: intermittent Alleviating factors: Omeprazole Aggravating factors: after you eating and activity Status: fluctuating Treatments attempted: Omeprazole Fever: no Nausea: no Vomiting: no Weight loss: no Decreased appetite: no Diarrhea: no Constipation: no Blood in stool: none Heartburn: occasional Jaundice: no Rash: no Dysuria/urinary frequency: no Hematuria: no History of sexually transmitted disease: no Recurrent NSAID use: no   Relevant past medical, surgical, family and social history reviewed and updated as indicated. Interim medical history since our last visit reviewed. Allergies and medications reviewed and updated.  Review of Systems  Constitutional:  Negative for activity change, diaphoresis, fatigue and fever.  Respiratory:  Negative for cough, chest  tightness, shortness of breath and wheezing.   Cardiovascular:  Negative for chest pain, palpitations and leg swelling.  Gastrointestinal:  Positive for abdominal pain. Negative for abdominal distention, blood in stool, constipation, diarrhea, nausea, rectal pain and vomiting.  Endocrine: Negative.   Neurological: Negative.   Psychiatric/Behavioral:  Negative for decreased concentration, self-injury, sleep disturbance and suicidal ideas. The patient is not nervous/anxious.    Per HPI unless specifically indicated above     Objective:    BP 129/86   Pulse 72   Temp 97.7 F (36.5 C) (Oral)   Ht 5\' 10"  (1.778 m)   Wt 258 lb (117 kg)   SpO2 97%   BMI 37.02 kg/m   Wt Readings from Last 3 Encounters:  09/05/22 258 lb (117 kg)  08/08/22 257 lb (116.6 kg)  07/30/22 250 lb (113.4 kg)    Physical Exam Vitals and nursing note reviewed.  Constitutional:      General: He is awake. He is not in acute distress.    Appearance: He is well-developed, well-groomed and overweight. He is not ill-appearing.  HENT:     Head: Normocephalic and atraumatic.     Right Ear: Hearing normal. No drainage.     Left Ear: Hearing normal. No drainage.  Eyes:     General: Lids are normal.        Right eye: No discharge.        Left eye: No discharge.     Conjunctiva/sclera: Conjunctivae normal.     Pupils: Pupils  are equal, round, and reactive to light.  Neck:     Vascular: No carotid bruit.  Cardiovascular:     Rate and Rhythm: Normal rate and regular rhythm.     Heart sounds: Normal heart sounds, S1 normal and S2 normal. No murmur heard.    No gallop.  Pulmonary:     Effort: Pulmonary effort is normal. No accessory muscle usage or respiratory distress.     Breath sounds: Normal breath sounds.  Abdominal:     General: Bowel sounds are normal. There is no distension.     Palpations: Abdomen is soft.     Tenderness: There is abdominal tenderness. There is no right CVA tenderness, left CVA tenderness,  guarding or rebound.     Hernia: No hernia is present.     Comments: Pain is to lateral aspects abdomen both sides, mild tender on palpation.  Musculoskeletal:     Cervical back: Normal range of motion and neck supple.     Right knee: Normal.     Left knee: Normal.     Right lower leg: No edema.     Left lower leg: No edema.  Skin:    General: Skin is warm and dry.  Neurological:     Mental Status: He is alert and oriented to person, place, and time.     Deep Tendon Reflexes: Reflexes are normal and symmetric.     Reflex Scores:      Brachioradialis reflexes are 2+ on the right side and 2+ on the left side.      Patellar reflexes are 2+ on the right side and 2+ on the left side. Psychiatric:        Attention and Perception: Attention normal.        Mood and Affect: Mood normal.        Speech: Speech normal.        Behavior: Behavior normal. Behavior is cooperative.    Results for orders placed or performed in visit on 08/08/22  H. pylori antigen, stool  Result Value Ref Range   H pylori Ag, Stl Negative Negative      Assessment & Plan:   Problem List Items Addressed This Visit       Digestive   Duodenal ulcer    Refer to abdominal pain plan of care.        Other   Abdominal pain - Primary    Chronic, diagnosed with duodenal ulcers by GI in April 2024 and taking Omeprazole which assisted at first, and Pepcid daily.  Pain is ongoing with bloating after eating.  Consulted with Dr. Tobi Bastos via secure chat, will start Voquenza 20 MG daily and stop Omeprazole and Pepcid.  Discussed with patient if too costly to go to GI office Monday for samples to trial.  Continue collaboration with GI, appreciate their input.         Follow up plan: Return in about 1 week (around 09/12/2022) for either next week or the week after for right knee injection.

## 2022-09-05 NOTE — Assessment & Plan Note (Signed)
Refer to abdominal pain plan of care. ?

## 2022-09-07 NOTE — Patient Instructions (Signed)

## 2022-09-08 ENCOUNTER — Telehealth: Payer: Self-pay

## 2022-09-08 NOTE — Telephone Encounter (Signed)
Patient came by the office stating he is needing samples of Vonoprazan 20mg . Please advise if samples can be given. Looks like PCP wrote him a script for this medication. Patient was last seen in the office on 05/19/22

## 2022-09-09 NOTE — Telephone Encounter (Signed)
Called patient and informed patient I would have samples upfront for him. Gave patient 4 boxes

## 2022-09-12 ENCOUNTER — Ambulatory Visit (INDEPENDENT_AMBULATORY_CARE_PROVIDER_SITE_OTHER): Payer: Managed Care, Other (non HMO) | Admitting: Nurse Practitioner

## 2022-09-12 ENCOUNTER — Encounter: Payer: Self-pay | Admitting: Nurse Practitioner

## 2022-09-12 VITALS — BP 121/78 | HR 72 | Temp 98.0°F | Wt 259.8 lb

## 2022-09-12 DIAGNOSIS — M25561 Pain in right knee: Secondary | ICD-10-CM

## 2022-09-12 DIAGNOSIS — G8929 Other chronic pain: Secondary | ICD-10-CM

## 2022-09-12 NOTE — Assessment & Plan Note (Signed)
Chronic for 2 years, currently with some worsening.  Imaging performed in 2022.  Would like steroid injection to see if benefit.  Educated him on this + risks/benefits.  Verbal consent obtained.  Steroid injection provided and tolerated well with no ADR.  Educated on post procedure recommendations, including ice to area every few hours for 24 hours and ensure to move often to allow injection to settle.  All questions answered.

## 2022-09-12 NOTE — Progress Notes (Signed)
BP 121/78   Pulse 72   Temp 98 F (36.7 C) (Oral)   Wt 259 lb 12.8 oz (117.8 kg)   SpO2 98%   BMI 37.28 kg/m    Subjective:    Patient ID: Joe Gutierrez, male    DOB: 14-Sep-1975, 47 y.o.   MRN: [id number]  HPI: Joe Gutierrez is a 47 y.o. male  Chief Complaint  Patient presents with   Knee Pain   KNEE PAIN Presents for injection to right knee.  Has chronic pain for 2 years.  Imaging in 2022 noted no specific arthritic changes. Duration: chronic Involved knee: right Mechanism of injury: unknown Location:medial Onset: gradual Severity: 10/10 if on feet for long period, 2-3/10 at present Quality:  sharp, aching, and throbbing Frequency: intermittent Radiation: no Aggravating factors: weight bearing, walking, bending, and movement  Alleviating factors: rest  Status: stable Treatments attempted: rest, ice, and APAP , Icy/Hot Relief with NSAIDs?:  No NSAIDs Taken Weakness with weight bearing or walking: yes Sensation of giving way: yes Locking: no Popping: no Bruising: no Swelling: no Redness: no Paresthesias/decreased sensation: no Fevers: no   Relevant past medical, surgical, family and social history reviewed and updated as indicated. Interim medical history since our last visit reviewed. Allergies and medications reviewed and updated.  Review of Systems  Constitutional:  Negative for activity change, diaphoresis, fatigue and fever.  Respiratory:  Negative for cough, chest tightness, shortness of breath and wheezing.   Cardiovascular:  Negative for chest pain, palpitations and leg swelling.  Gastrointestinal: Negative.   Endocrine: Negative.   Musculoskeletal:  Positive for arthralgias.  Neurological: Negative.   Psychiatric/Behavioral:  Negative for decreased concentration, self-injury, sleep disturbance and suicidal ideas. The patient is not nervous/anxious.     Per HPI unless specifically indicated above     Objective:    BP 121/78   Pulse 72    Temp 98 F (36.7 C) (Oral)   Wt 259 lb 12.8 oz (117.8 kg)   SpO2 98%   BMI 37.28 kg/m   Wt Readings from Last 3 Encounters:  09/12/22 259 lb 12.8 oz (117.8 kg)  09/05/22 258 lb (117 kg)  08/08/22 257 lb (116.6 kg)    Physical Exam Vitals and nursing note reviewed.  Constitutional:      General: He is awake. He is not in acute distress.    Appearance: He is well-developed, well-groomed and overweight. He is not ill-appearing.  HENT:     Head: Normocephalic and atraumatic.     Right Ear: Hearing normal. No drainage.     Left Ear: Hearing normal. No drainage.  Eyes:     General: Lids are normal.        Right eye: No discharge.        Left eye: No discharge.     Conjunctiva/sclera: Conjunctivae normal.     Pupils: Pupils are equal, round, and reactive to light.  Neck:     Vascular: No carotid bruit.  Cardiovascular:     Rate and Rhythm: Normal rate and regular rhythm.     Heart sounds: Normal heart sounds, S1 normal and S2 normal. No murmur heard.    No gallop.  Pulmonary:     Effort: Pulmonary effort is normal. No accessory muscle usage or respiratory distress.     Breath sounds: Normal breath sounds.  Abdominal:     General: Bowel sounds are normal. There is no distension.     Palpations: Abdomen is soft.  Tenderness: There is no abdominal tenderness.     Hernia: No hernia is present.  Musculoskeletal:     Cervical back: Normal range of motion and neck supple.     Right knee: Crepitus present. No swelling, erythema or bony tenderness. Normal range of motion. Tenderness present over the medial joint line. Normal alignment and normal meniscus. Normal pulse.     Instability Tests: Anterior drawer test negative. Posterior drawer test negative. Medial McMurray test negative and lateral McMurray test negative.     Left knee: Normal.     Right lower leg: No edema.     Left lower leg: No edema.  Skin:    General: Skin is warm and dry.  Neurological:     Mental Status: He is  alert and oriented to person, place, and time.     Deep Tendon Reflexes: Reflexes are normal and symmetric.     Reflex Scores:      Brachioradialis reflexes are 2+ on the right side and 2+ on the left side.      Patellar reflexes are 2+ on the right side and 2+ on the left side. Psychiatric:        Attention and Perception: Attention normal.        Mood and Affect: Mood normal.        Speech: Speech normal.        Behavior: Behavior normal. Behavior is cooperative.   STEROID INJECTION  Procedure: Right Knee Intraarticular Steroid Injection  Description: After verbal consent and patient education on procedure, area prepped and draped using semi-sterile technique. Using a anterior  approach, a mixture of 4 cc of  1% Marcaine & 1 cc of Kenalog 40 was injected into knee joint.  A bandage was then placed over the injection site. Complications:  none Post Procedure Instructions: To the ER if any symptoms of erythema or swelling.   Follow Up: PRN   Results for orders placed or performed in visit on 08/08/22  H. pylori antigen, stool  Result Value Ref Range   H pylori Ag, Stl Negative Negative      Assessment & Plan:   Problem List Items Addressed This Visit       Other   Chronic pain of right knee - Primary    Chronic for 2 years, currently with some worsening.  Imaging performed in 2022.  Would like steroid injection to see if benefit.  Educated him on this + risks/benefits.  Verbal consent obtained.  Steroid injection provided and tolerated well with no ADR.  Educated on post procedure recommendations, including ice to area every few hours for 24 hours and ensure to move often to allow injection to settle.  All questions answered.        Follow up plan: Return if symptoms worsen or fail to improve.

## 2022-09-22 ENCOUNTER — Encounter: Payer: Self-pay | Admitting: Nurse Practitioner

## 2022-09-22 ENCOUNTER — Other Ambulatory Visit: Payer: Self-pay | Admitting: Nurse Practitioner

## 2022-09-22 MED ORDER — FLUCONAZOLE 150 MG PO TABS: ORAL_TABLET | ORAL | 0 refills | Status: DC

## 2022-09-29 ENCOUNTER — Encounter: Payer: Self-pay | Admitting: Gastroenterology

## 2022-09-30 NOTE — Telephone Encounter (Signed)
Was his meds refused by insurance? If still in process we can give samples till we hear bvack

## 2022-10-01 NOTE — Telephone Encounter (Signed)
Called patient's insurance and they stated that they could do a prior-authorization by fax. Therefore, they will fax [place] the form to fill out and fax back.

## 2022-10-15 NOTE — Telephone Encounter (Signed)
Patient's insurance sent [place] a letter stating that the patient was approved for a year for his Voquezna. Patient was notified.

## 2022-12-11 ENCOUNTER — Encounter: Payer: Self-pay | Admitting: Nurse Practitioner

## 2022-12-25 ENCOUNTER — Other Ambulatory Visit: Payer: Self-pay | Admitting: Nurse Practitioner

## 2022-12-25 NOTE — Telephone Encounter (Signed)
Requested Prescriptions  Pending Prescriptions Disp Refills   lisinopril (ZESTRIL) 40 MG tablet [Pharmacy Med Name: LISINOPRIL 40 MG TABLET] 90 tablet 0    Sig: TAKE 1 TABLET BY MOUTH EVERY DAY     Cardiovascular:  ACE Inhibitors Failed - 12/25/2022  1:35 AM      Failed - Cr in normal range and within 180 days    Creatinine  Date Value Ref Range Status  08/09/2012 0.96 0.60 - 1.30 mg/dL Final   Creatinine, Ser  Date Value Ref Range Status  06/09/2022 0.90 0.76 - 1.27 mg/dL Final         Failed - K in normal range and within 180 days    Potassium  Date Value Ref Range Status  06/09/2022 4.2 3.5 - 5.2 mmol/L Final  08/09/2012 4.3 3.5 - 5.1 mmol/L Final         Passed - Patient is not pregnant      Passed - Last BP in normal range    BP Readings from Last 1 Encounters:  09/12/22 121/78         Passed - Valid encounter within last 6 months    Recent Outpatient Visits           3 months ago Chronic pain of right knee   [place] [facility] [place], [place] T, NP   3 months ago Epigastric pain   [place] [facility] [place], [place] T, NP   4 months ago Epigastric pain   [place] [facility] [place], [place] T, NP   6 months ago Depression with anxiety   [place] [facility] [place], [place] T, NP   11 months ago Pain in left testicle   [facility] Larae Grooms, NP       Future Appointments             In 1 month McGowan, [place], PA-C [facility] [place]             buPROPion (WELLBUTRIN XL) 150 MG 24 hr tablet [Pharmacy Med Name: BUPROPION HCL XL 150 MG TABLET] 90 tablet 0    Sig: TAKE 1 TABLET BY MOUTH EVERY DAY     Psychiatry: Antidepressants - bupropion Passed - 12/25/2022  1:35 AM      Passed - Cr in normal range and within 360 days    Creatinine  Date Value Ref Range Status  08/09/2012 0.96 0.60 - 1.30 mg/dL Final   Creatinine, Ser  Date Value Ref  Range Status  06/09/2022 0.90 0.76 - 1.27 mg/dL Final         Passed - AST in normal range and within 360 days    AST  Date Value Ref Range Status  06/09/2022 18 0 - 40 IU/L Final   SGOT(AST)  Date Value Ref Range Status  08/09/2012 24 15 - 37 Unit/L Final         Passed - ALT in normal range and within 360 days    ALT  Date Value Ref Range Status  06/09/2022 19 0 - 44 IU/L Final   SGPT (ALT)  Date Value Ref Range Status  08/09/2012 22 12 - 78 U/L Final         Passed - Completed PHQ-2 or PHQ-9 in the last 360 days      Passed - Last BP in normal range    BP Readings from Last 1 Encounters:  09/12/22 121/78  Passed - Valid encounter within last 6 months    Recent Outpatient Visits           3 months ago Chronic pain of right knee   [place] [facility] [place], Dorie Rank, NP   3 months ago Epigastric pain   [place] [facility] [place], [place] T, NP   4 months ago Epigastric pain   [place] [facility] [place], [place] T, NP   6 months ago Depression with anxiety   [place] [facility] [place], Corrie Dandy T, NP   11 months ago Pain in left testicle    [facility] Larae Grooms, NP       Future Appointments             In 1 month McGowan, Elana [place] [facility] [place] [facility]

## 2023-01-13 ENCOUNTER — Other Ambulatory Visit: Payer: Self-pay | Admitting: *Deleted

## 2023-01-13 DIAGNOSIS — E349 Endocrine disorder, unspecified: Secondary | ICD-10-CM

## 2023-01-13 MED ORDER — TESTOSTERONE CYPIONATE 200 MG/ML IM SOLN
200.0000 mg | INTRAMUSCULAR | 0 refills | Status: DC
Start: 2023-01-13 — End: 2023-01-26

## 2023-01-26 ENCOUNTER — Other Ambulatory Visit: Payer: Self-pay | Admitting: *Deleted

## 2023-01-26 ENCOUNTER — Other Ambulatory Visit: Payer: Self-pay

## 2023-01-26 DIAGNOSIS — E349 Endocrine disorder, unspecified: Secondary | ICD-10-CM

## 2023-01-26 MED ORDER — SERTRALINE HCL 100 MG PO TABS: 200.0000 mg | ORAL_TABLET | Freq: Every day | ORAL | 4 refills | Status: DC

## 2023-01-26 MED ORDER — TESTOSTERONE CYPIONATE 200 MG/ML IM SOLN
200.0000 mg | INTRAMUSCULAR | 0 refills | Status: DC
Start: 2023-01-26 — End: 2023-07-30

## 2023-01-26 NOTE — Telephone Encounter (Signed)
Pt would like refill sent to [facility] in [place], they rx for graham was never picked up

## 2023-01-27 NOTE — Progress Notes (Signed)
01/30/23 8:41 AM   Joe Gutierrez February 23, 1975 [id number]  Referring provider:  Marjie Skiff, NP [address] [place],  [place] [address]  Urological history  1. Testosterone deficiency -contributing factors of age, obesity and sleep apnea -testosterone level (01/2023) 301 -hemoglobin/hematocrit (01/2023)  15.5/47.2 -testosterone cypionate 200 mg/mL, 1 cc every 14 days   2.  Erectile dysfunction -contributing factors of age, sleep apnea, HTN, BPH, HLD and testosterone deficiency  3. Prostate cancer screening -PSA (01/2023) 1.5   4. BPH w/ LU TS -Contributing factors of age, hypertension, sleep apnea, depression, anxiety and obesity  5. Hydroceles -scrotal US (01/2022) small bilateral hydroceles  6. Varicoceles -scrotal US (01/2022) small bilateral varicoceles   Chief Complaint  Patient presents with   Hypogonadism   Erectile Dysfunction   Benign Prostatic Hypertrophy    HPI: Joe Gutierrez is a 47 y.o.male who presents today for follow-up.  Previous records reviewed.   I PSS 5/0  He is having nocturia x 2, but he does have untreated sleep apnea.  Patient denies any modifying or aggravating factors.  Patient denies any recent UTI's, gross hematuria, dysuria or suprapubic/flank pain.  Patient denies any fevers, chills, nausea or vomiting.      IPSS     Row Name 01/30/23 0800         International Prostate Symptom Score   How often have you had the sensation of not emptying your bladder? Less than 1 in 5     How often have you had to urinate less than every two hours? Less than 1 in 5 times     How often have you found you stopped and started again several times when you urinated? Not at All     How often have you found it difficult to postpone urination? Less than 1 in 5 times     How often have you had a weak urinary stream? Not at All     How often have you had to strain to start urination? Less than 1 in 5 times     How many times did you typically get  up at night to urinate? 1 Time     Total IPSS Score 5       Quality of Life due to urinary symptoms   If you were to spend the rest of your life with your urinary condition just the way it is now how would you feel about that? Pleased                 Score:  1-7 Mild 8-19 Moderate 20-35 Severe  SHIM 16  His erections have improved since starting testosterone therapy.  He also has improved ejaculation by decreasing his dose of sertraline.  He is having spontaneous erections.  He denies any curvature to erections.  He denies any pain with erections.    SHIM     Row Name 01/30/23 0820         SHIM: Over the last 6 months:   How do you rate your confidence that you could get and keep an erection? Moderate     When you had erections with sexual stimulation, how often were your erections hard enough for penetration (entering your partner)? Most Times (much more than half the time)     During sexual intercourse, how often were you able to maintain your erection after you had penetrated (entered) your partner? Most Times (much more than half the time)  During sexual intercourse, how difficult was it to maintain your erection to completion of intercourse? Difficult     When you attempted sexual intercourse, how often was it satisfactory for you? A Few Times (much less than half the time)       SHIM Total Score   SHIM 16              Score: 1-7 Severe ED 8-11 Moderate ED 12-16 Mild-Moderate ED 17-21 Mild ED 22-25 No ED   PMH: Past Medical History:  Diagnosis Date   Anxiety    Arthritis    Colon polyp    Depression    Heart murmur    History of kidney stones    Hypertension    Hypogonadism in male    Obesity    Sleep apnea     Surgical History: Past Surgical History:  Procedure Laterality Date   APPENDECTOMY     CLOSED REDUCTION NASAL FRACTURE N/A 06/11/2021   Procedure: CLOSED REDUCTION NASAL FRACTURE;  Surgeon: Allena Napoleon, MD;  Location: [facility] OR;   Service: Plastics;  Laterality: N/A;   COLONOSCOPY N/A 06/17/2022   Procedure: COLONOSCOPY;  Surgeon: Wyline Mood, MD;  Location: [facility] ENDOSCOPY;  Service: Gastroenterology;  Laterality: N/A;   COLONOSCOPY WITH PROPOFOL N/A 11/16/2020   Procedure: COLONOSCOPY WITH PROPOFOL;  Surgeon: Wyline Mood, MD;  Location: [facility] ENDOSCOPY;  Service: Gastroenterology;  Laterality: N/A;   COLONOSCOPY WITH PROPOFOL N/A 07/21/2022   Procedure: COLONOSCOPY WITH PROPOFOL;  Surgeon: Wyline Mood, MD;  Location: [facility] ENDOSCOPY;  Service: Gastroenterology;  Laterality: N/A;   ESOPHAGOGASTRODUODENOSCOPY (EGD) WITH PROPOFOL N/A 06/17/2022   Procedure: ESOPHAGOGASTRODUODENOSCOPY (EGD) WITH PROPOFOL;  Surgeon: Wyline Mood, MD;  Location: [facility] ENDOSCOPY;  Service: Gastroenterology;  Laterality: N/A;   LAPAROSCOPIC RIGHT COLECTOMY N/A 01/24/2021   Procedure: LAPAROSCOPIC RIGHT COLECTOMY with Laqueta Due, PA-C assisting;  Surgeon: Leafy Ro, MD;  Location: [facility] ORS;  Service: General;  Laterality: N/A;   LITHOTRIPSY      Home Medications:  Allergies as of 01/30/2023       Reactions   Codeine Hives   Sulfa Antibiotics Hives        Medication List        Accurate as of January 30, 2023  8:41 AM. If you have any questions, ask your nurse or doctor.          STOP taking these medications    busPIRone 5 MG tablet Commonly known as: BUSPAR   fluconazole 150 MG tablet Commonly known as: DIFLUCAN   Na Sulfate-K Sulfate-Mg Sulf 17.5-3.13-1.6 GM/177ML Soln   Voquezna 20 MG Tabs Generic drug: Vonoprazan Fumarate       TAKE these medications    2-3CC SYRINGE 3 ML Misc 1 mg by Does not apply route every 14 (fourteen) days.   BD Disp Needles 18G X 1-1/2" Misc Generic drug: NEEDLE (DISP) 18 G 1 mg by Does not apply route every 14 (fourteen) days.   BD Disp Needles 21G X 1-1/2" Misc Generic drug: NEEDLE (DISP) 21 G 1 mg by Does not apply route every 14 (fourteen) days.   buPROPion 150 MG 24 hr  tablet Commonly known as: WELLBUTRIN XL TAKE 1 TABLET BY MOUTH EVERY DAY   cyproheptadine 4 MG tablet Commonly known as: PERIACTIN Take one tablet 3 hours prior to intercourse   lisinopril 40 MG tablet Commonly known as: ZESTRIL TAKE 1 TABLET BY MOUTH EVERY DAY   sertraline 100 MG tablet Commonly known as: ZOLOFT Take 2  tablets (200 mg total) by mouth daily.   testosterone cypionate 200 MG/ML injection Commonly known as: DEPOTESTOSTERONE CYPIONATE Inject 1 mL (200 mg total) into the muscle every 14 (fourteen) days.   Vitamin D (Cholecalciferol) 25 MCG (1000 UT) Tabs Take 1,000 Units by mouth daily.        Allergies:  Allergies  Allergen Reactions   Codeine Hives   Sulfa Antibiotics Hives    Family History: Family History  Problem Relation Age of Onset   Diabetes Mother    Hypertension Mother    Alcohol abuse Father    Heart disease Father    Hypertension Father    Diabetes Maternal Grandmother    Stroke Maternal Grandfather    Hypertension Brother    Asthma Daughter    Heart disease Paternal Grandfather     Social History:  reports that he has never smoked. He has never used smokeless tobacco. He reports that he does not drink alcohol and does not use drugs.   Physical Exam: BP (!) 145/95   Pulse 70   Ht 5\' 10"  (1.778 m)   Wt 250 lb (113.4 kg)   BMI 35.87 kg/m   Constitutional:  Well nourished. Alert and oriented, No acute distress. HEENT: [place] AT, moist mucus membranes.  Trachea midline Cardiovascular: No clubbing, cyanosis, or edema. Respiratory: Normal respiratory effort, no increased work of breathing. Neurologic: Grossly intact, no focal deficits, moving all 4 extremities. Psychiatric: Normal mood and affect.   Laboratory Data: Results for orders placed or performed in visit on 01/29/23  PSA   Collection Time: 01/29/23  8:07 AM  Result Value Ref Range   Prostate Specific Ag, Serum 1.5 0.0 - 4.0 ng/mL  Testosterone   Collection Time: 01/29/23   8:07 AM  Result Value Ref Range   Testosterone 301 264 - 916 ng/dL  Hemoglobin and Hematocrit, Blood   Collection Time: 01/29/23  8:07 AM  Result Value Ref Range   Hemoglobin 15.5 13.0 - 17.7 g/dL   Hematocrit 86.5 78.4 - 51.0 %  I have reviewed the labs.   Pertinent Imaging N/A  Assessment & Plan:    1. Testosterone deficiency - Testosterone level subtherapeutic, but his blood work was drawn just prior to his injections so that is expected - hemoglobin/hematocrit normal -continue testosterone cypionate 200 mg/mL, 1 cc every 14 days  2.  Erectile dysfunction - slight improvement in function, but still having issues for low libido   3. BPH with LU TS  - PSA normal  4.  Ejaculatory disorder -Resolved with TRT and adjustment of his sertraline  Return in about 6 months (around 07/31/2023) for PSA, testosterone (one week after injection) H & H, IPSS, SHIM .  Cloretta Ned   [facility] [address], [address] [place], [place] [address] [phone]

## 2023-01-29 ENCOUNTER — Other Ambulatory Visit: Payer: Managed Care, Other (non HMO)

## 2023-01-29 DIAGNOSIS — N138 Other obstructive and reflux uropathy: Secondary | ICD-10-CM

## 2023-01-29 DIAGNOSIS — E291 Testicular hypofunction: Secondary | ICD-10-CM

## 2023-01-30 ENCOUNTER — Encounter: Payer: Self-pay | Admitting: Urology

## 2023-01-30 ENCOUNTER — Ambulatory Visit: Payer: Managed Care, Other (non HMO) | Admitting: Urology

## 2023-01-30 VITALS — BP 145/95 | HR 70 | Ht 70.0 in | Wt 250.0 lb

## 2023-01-30 DIAGNOSIS — N401 Enlarged prostate with lower urinary tract symptoms: Secondary | ICD-10-CM

## 2023-01-30 DIAGNOSIS — N138 Other obstructive and reflux uropathy: Secondary | ICD-10-CM

## 2023-01-30 DIAGNOSIS — E291 Testicular hypofunction: Secondary | ICD-10-CM | POA: Diagnosis not present

## 2023-01-30 DIAGNOSIS — N529 Male erectile dysfunction, unspecified: Secondary | ICD-10-CM

## 2023-01-30 DIAGNOSIS — N5319 Other ejaculatory dysfunction: Secondary | ICD-10-CM | POA: Diagnosis not present

## 2023-01-30 LAB — PSA: Prostate Specific Ag, Serum: 1.5 ng/mL (ref 0.0–4.0)

## 2023-01-30 LAB — HEMOGLOBIN AND HEMATOCRIT, BLOOD
Hematocrit: 47.2 % (ref 37.5–51.0)
Hemoglobin: 15.5 g/dL (ref 13.0–17.7)

## 2023-01-30 LAB — TESTOSTERONE: Testosterone: 301 ng/dL (ref 264–916)

## 2023-04-21 ENCOUNTER — Encounter: Payer: Self-pay | Admitting: Nurse Practitioner

## 2023-04-21 ENCOUNTER — Telehealth: Admitting: Nurse Practitioner

## 2023-04-21 DIAGNOSIS — M545 Low back pain, unspecified: Secondary | ICD-10-CM | POA: Insufficient documentation

## 2023-04-21 MED ORDER — CYCLOBENZAPRINE HCL 10 MG PO TABS: 10.0000 mg | ORAL_TABLET | Freq: Three times a day (TID) | ORAL | 0 refills | Status: DC | PRN

## 2023-04-21 MED ORDER — PREDNISONE 10 MG (21) PO TBPK
ORAL_TABLET | ORAL | 0 refills | Status: DC
Start: 2023-04-21 — End: 2023-06-13

## 2023-04-21 NOTE — Progress Notes (Signed)
 Appointment has been made

## 2023-04-21 NOTE — Assessment & Plan Note (Addendum)
 Acute for a few days, not 100% better.  Has had similar in the past.  No red flags.  Start steroid taper and Flexeril.  Educated on this plan of care.  Recommend continue OTC treatment, including heating pad.  If worsening or ongoing alert provider.

## 2023-04-21 NOTE — Progress Notes (Signed)
 There were no vitals taken for this visit.   Subjective:    Patient ID: Joe Gutierrez, male    DOB: 04/02/1975, 48 y.o.   MRN: [id number]  HPI: Joe Gutierrez is a 48 y.o. male  Chief Complaint  Patient presents with   Back Pain   Virtual Visit via Video Note  I connected with [name] on 04/21/23 at  1:40 PM EST by a video enabled telemedicine application and verified that I am speaking with the correct person using two identifiers.  Location: Patient: home Provider: work   I discussed the limitations of evaluation and management by telemedicine and the availability of in person appointments. The patient expressed understanding and agreed to proceed.  I discussed the assessment and treatment plan with the patient. The patient was provided an opportunity to ask questions and all were answered. The patient agreed with the plan and demonstrated an understanding of the instructions.   The patient was advised to call back or seek an in-person evaluation if the symptoms worsen or if the condition fails to improve as anticipated.  I provided 25 minutes of non-face-to-face time during this encounter.   Marjie Skiff, NP   BACK PAIN Started with lower back pain 2 days, yesterday was rough. Could not stand up straight.  Today a little better.  Has had similar flares in the past. Duration: days Mechanism of injury: unknown Location: midline and low back Onset: gradual Severity: 7/10 Quality: dull, aching, and throbbing Frequency: constant -- sitting down not as bad Radiation: none Aggravating factors: lifting, movement, walking, and bending Alleviating factors: rest Status: today a little better but not 100% Treatments attempted: Rest  Relief with NSAIDs?: No NSAIDs Taken Nighttime pain:  no Paresthesias / decreased sensation:  no Bowel / bladder incontinence:  no Fevers:  no Dysuria / urinary frequency:  no   Relevant past medical, surgical, family and  social history reviewed and updated as indicated. Interim medical history since our last visit reviewed. Allergies and medications reviewed and updated.  Review of Systems  Constitutional:  Negative for activity change, diaphoresis, fatigue and fever.  Respiratory:  Negative for cough, chest tightness, shortness of breath and wheezing.   Cardiovascular:  Negative for chest pain, palpitations and leg swelling.  Gastrointestinal: Negative.   Endocrine: Negative.   Musculoskeletal:  Positive for back pain.  Neurological: Negative.   Psychiatric/Behavioral:  Negative for decreased concentration, self-injury, sleep disturbance and suicidal ideas. The patient is not nervous/anxious.    Per HPI unless specifically indicated above     Objective:    There were no vitals taken for this visit.  Wt Readings from Last 3 Encounters:  01/30/23 250 lb (113.4 kg)  09/12/22 259 lb 12.8 oz (117.8 kg)  09/05/22 258 lb (117 kg)    Physical Exam Vitals and nursing note reviewed.  Constitutional:      General: He is awake. He is not in acute distress.    Appearance: He is well-developed. He is obese. He is not ill-appearing.  HENT:     Head: Normocephalic.     Right Ear: Hearing normal. No drainage.     Left Ear: Hearing normal. No drainage.  Eyes:     General: Lids are normal.        Right eye: No discharge.        Left eye: No discharge.     Conjunctiva/sclera: Conjunctivae normal.  Pulmonary:     Effort: Pulmonary effort is normal.  No accessory muscle usage or respiratory distress.  Musculoskeletal:     Cervical back: Normal range of motion.  Neurological:     Mental Status: He is alert and oriented to person, place, and time.  Psychiatric:        Mood and Affect: Mood normal.        Behavior: Behavior normal. Behavior is cooperative.        Thought Content: Thought content normal.        Judgment: Judgment normal.    Results for orders placed or performed in visit on 01/29/23  PSA    Collection Time: 01/29/23  8:07 AM  Result Value Ref Range   Prostate Specific Ag, Serum 1.5 0.0 - 4.0 ng/mL  Testosterone   Collection Time: 01/29/23  8:07 AM  Result Value Ref Range   Testosterone 301 264 - 916 ng/dL  Hemoglobin and Hematocrit, Blood   Collection Time: 01/29/23  8:07 AM  Result Value Ref Range   Hemoglobin 15.5 13.0 - 17.7 g/dL   Hematocrit 09.8 11.9 - 51.0 %      Assessment & Plan:   Problem List Items Addressed This Visit       Other   Acute low back pain - Primary   Acute for a few days, not 100% better.  Has had similar in the past.  No red flags.  Start steroid taper and Flexeril.  Educated on this plan of care.  Recommend continue OTC treatment, including heating pad.  If worsening or ongoing alert provider.      Relevant Medications   predniSONE (STERAPRED UNI-PAK 21 TAB) 10 MG (21) TBPK tablet   cyclobenzaprine (FLEXERIL) 10 MG tablet     Follow up plan: Return in about 6 weeks (around 06/02/2023) for Annual Physical.

## 2023-04-21 NOTE — Patient Instructions (Signed)
 Acute Back Pain, Adult Acute back pain is sudden and usually short-lived. It is often caused by an injury to the muscles and tissues in the back. The injury may result from: A muscle, tendon, or ligament getting overstretched or torn. Ligaments are tissues that connect bones to each other. Lifting something improperly can cause a back strain. Wear and tear (degeneration) of the spinal disks. Spinal disks are circular tissue that provide cushioning between the bones of the spine (vertebrae). Twisting motions, such as while playing sports or doing yard work. A hit to the back. Arthritis. You may have a physical exam, lab tests, and imaging tests to find the cause of your pain. Acute back pain usually goes away with rest and home care. Follow these instructions at home: Managing pain, stiffness, and swelling Take over-the-counter and prescription medicines only as told by your health care provider. Treatment may include medicines for pain and inflammation that are taken by mouth or applied to the skin, or muscle relaxants. Your health care provider may recommend applying ice during the first 24-48 hours after your pain starts. To do this: Put ice in a plastic bag. Place a towel between your skin and the bag. Leave the ice on for 20 minutes, 2-3 times a day. Remove the ice if your skin turns bright red. This is very important. If you cannot feel pain, heat, or cold, you have a greater risk of damage to the area. If directed, apply heat to the affected area as often as told by your health care provider. Use the heat source that your health care provider recommends, such as a moist heat pack or a heating pad. Place a towel between your skin and the heat source. Leave the heat on for 20-30 minutes. Remove the heat if your skin turns bright red. This is especially important if you are unable to feel pain, heat, or cold. You have a greater risk of getting burned. Activity  Do not stay in bed. Staying in  bed for more than 1-2 days can delay your recovery. Sit up and stand up straight. Avoid leaning forward when you sit or hunching over when you stand. If you work at a desk, sit close to it so you do not need to lean over. Keep your chin tucked in. Keep your neck drawn back, and keep your elbows bent at a 90-degree angle (right angle). Sit high and close to the steering wheel when you drive. Add lower back (lumbar) support to your car seat, if needed. Take short walks on even surfaces as soon as you are able. Try to increase the length of time you walk each day. Do not sit, drive, or stand in one place for more than 30 minutes at a time. Sitting or standing for long periods of time can put stress on your back. Do not drive or use heavy machinery while taking prescription pain medicine. Use proper lifting techniques. When you bend and lift, use positions that put less stress on your back: [place] your knees. Keep the load close to your body. Avoid twisting. Exercise regularly as told by your health care provider. Exercising helps your back heal faster and helps prevent back injuries by keeping muscles strong and flexible. Work with a physical therapist to make a safe exercise program, as recommended by your health care provider. Do any exercises as told by your physical therapist. Lifestyle Maintain a healthy weight. Extra weight puts stress on your back and makes it difficult to have good  posture. Avoid activities or situations that make you feel anxious or stressed. Stress and anxiety increase muscle tension and can make back pain worse. Learn ways to manage anxiety and stress, such as through exercise. General instructions Sleep on a firm mattress in a comfortable position. Try lying on your side with your knees slightly bent. If you lie on your back, put a pillow under your knees. Keep your head and neck in a straight line with your spine (neutral position) when using electronic equipment like  smartphones or pads. To do this: Raise your smartphone or pad to look at it instead of bending your head or neck to look down. Put the smartphone or pad at the level of your face while looking at the screen. Follow your treatment plan as told by your health care provider. This may include: Cognitive or behavioral therapy. Acupuncture or massage therapy. Meditation or yoga. Contact a health care provider if: You have pain that is not relieved with rest or medicine. You have increasing pain going down into your legs or buttocks. Your pain does not improve after 2 weeks. You have pain at night. You lose weight without trying. You have a fever or chills. You develop nausea or vomiting. You develop abdominal pain. Get help right away if: You develop new bowel or bladder control problems. You have unusual weakness or numbness in your arms or legs. You feel faint. These symptoms may represent a serious problem that is an emergency. Do not wait to see if the symptoms will go away. Get medical help right away. Call your local emergency services (911 in the [place] Do not drive yourself to the hospital. Summary Acute back pain is sudden and usually short-lived. Use proper lifting techniques. When you bend and lift, use positions that put less stress on your back. Take over-the-counter and prescription medicines only as told by your health care provider, and apply heat or ice as told. This information is not intended to replace advice given to you by your health care provider. Make sure you discuss any questions you have with your health care provider. Document Revised: 04/27/2020 Document Reviewed: 04/27/2020 Elsevier Patient Education  2024 [employer].

## 2023-04-26 ENCOUNTER — Other Ambulatory Visit: Payer: Self-pay | Admitting: Nurse Practitioner

## 2023-05-21 IMAGING — US US EXTREM  UP VENOUS*L*
1 series · 13 of 24 positions shown · non-contrast
Comparison: None.

CLINICAL DATA: Recent colectomy, now with induration to LUE suspect
thrombophlebitis, rule out DVT



[Series 1: us extrem up venous*left* · 0.06mm/px · 13 of 44 slices shown]
[im 1/44]
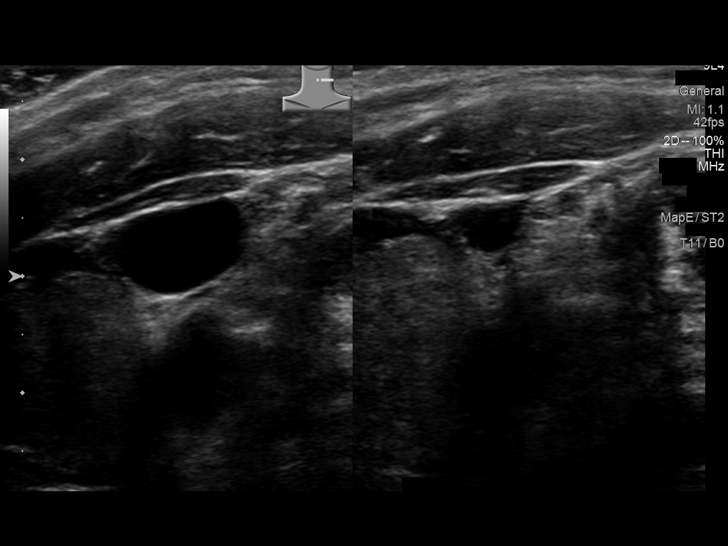
[im 4/44]
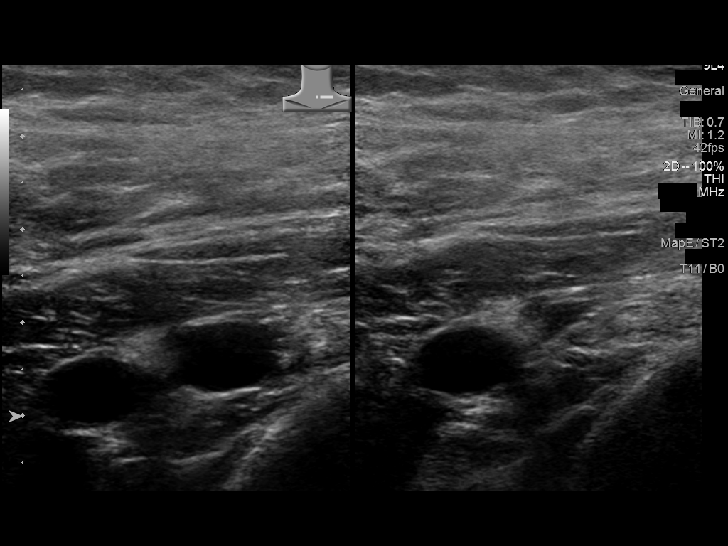
[im 8/44]
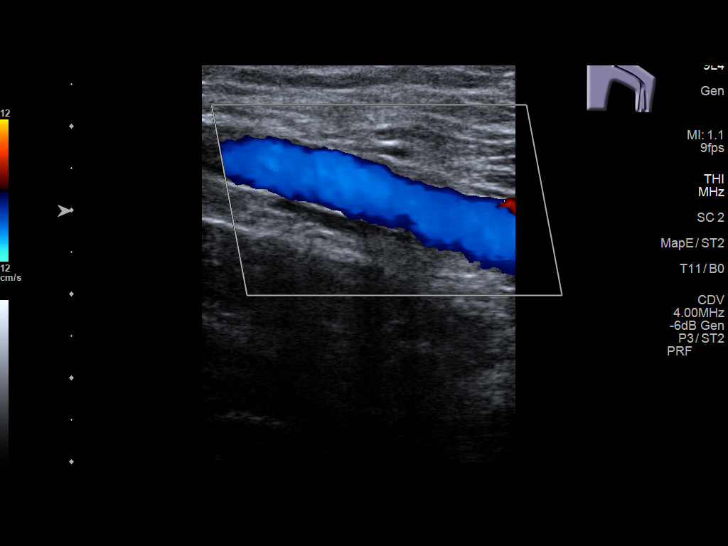
[im 12/44]
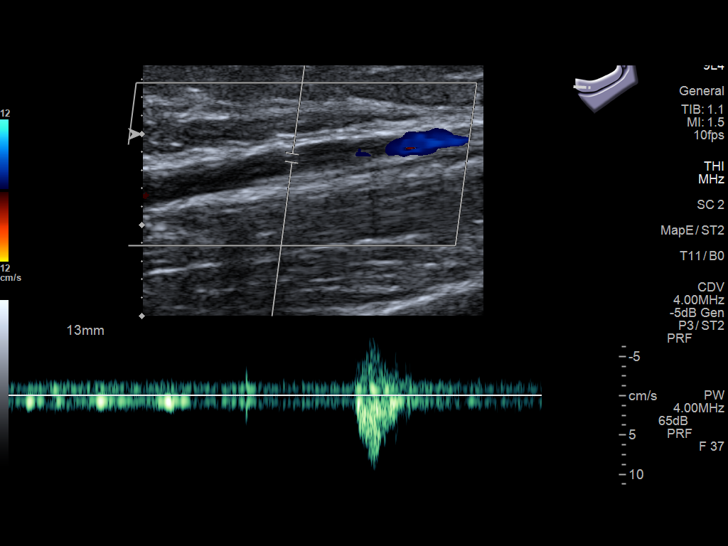
[im 15/44]
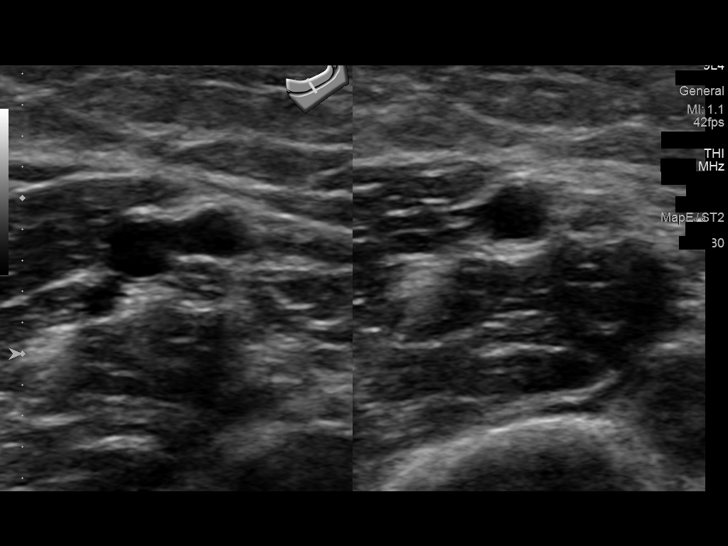
[im 19/44]
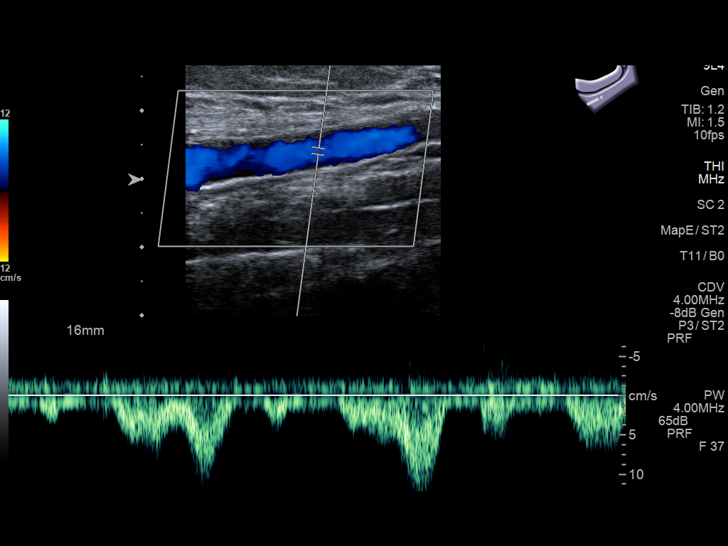
[im 23/44]
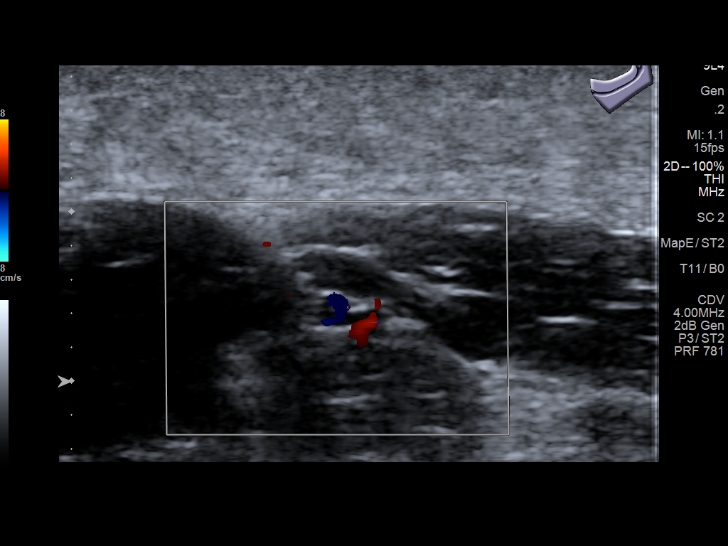
[im 25/44]
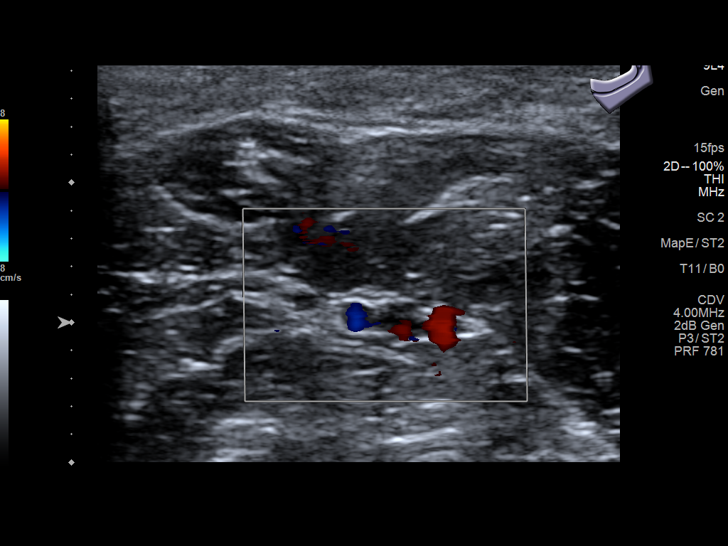
[im 29/44]
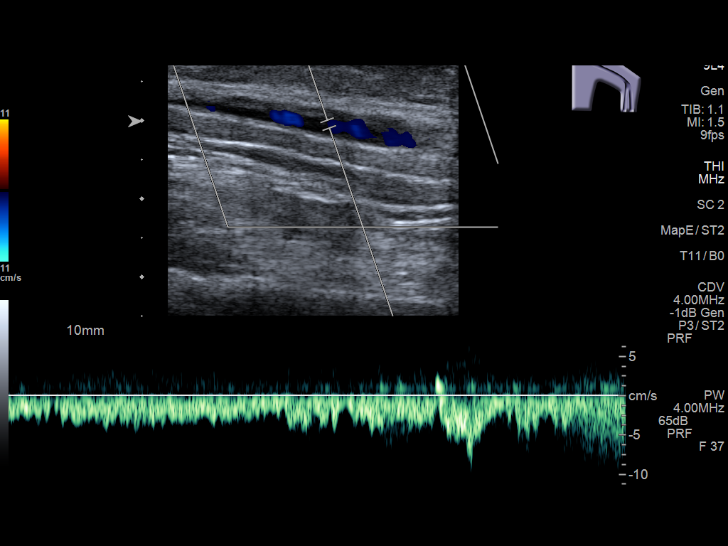
[im 32/44]
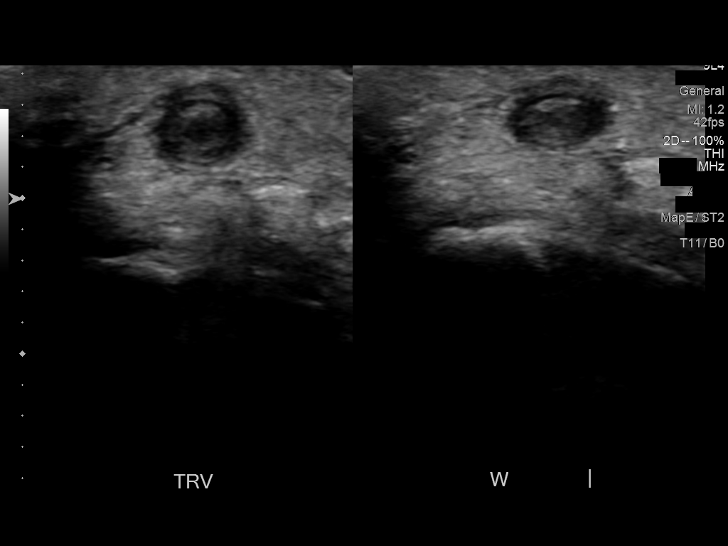
[im 36/44]
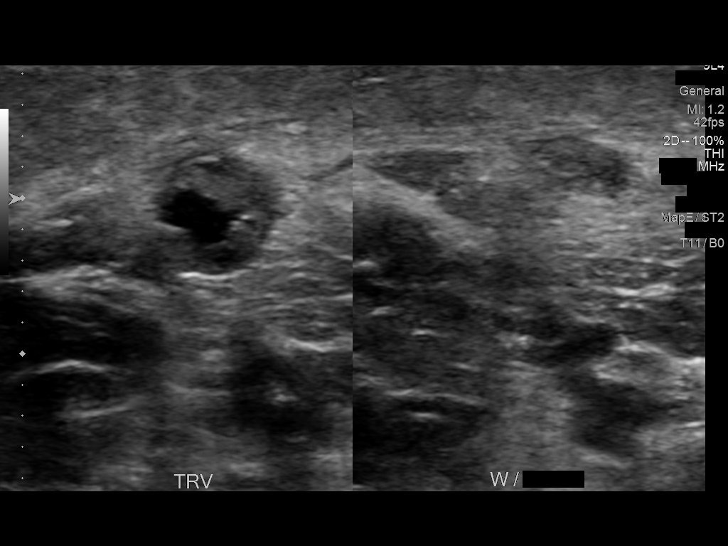
[im 40/44]
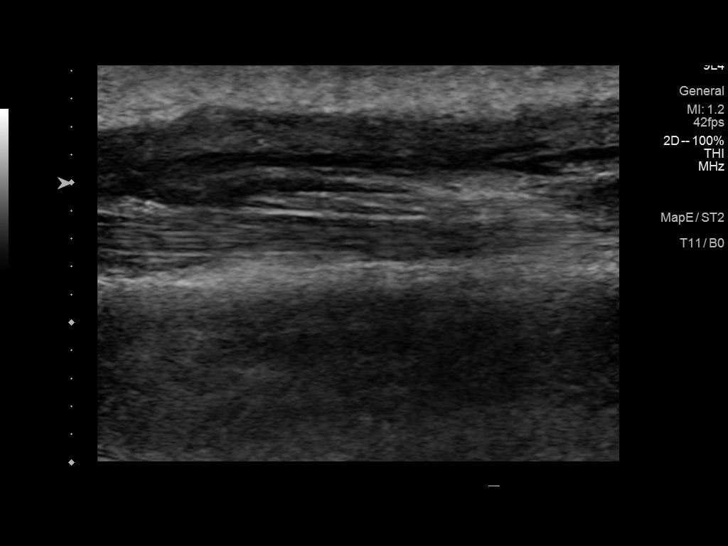
[im 44/44]
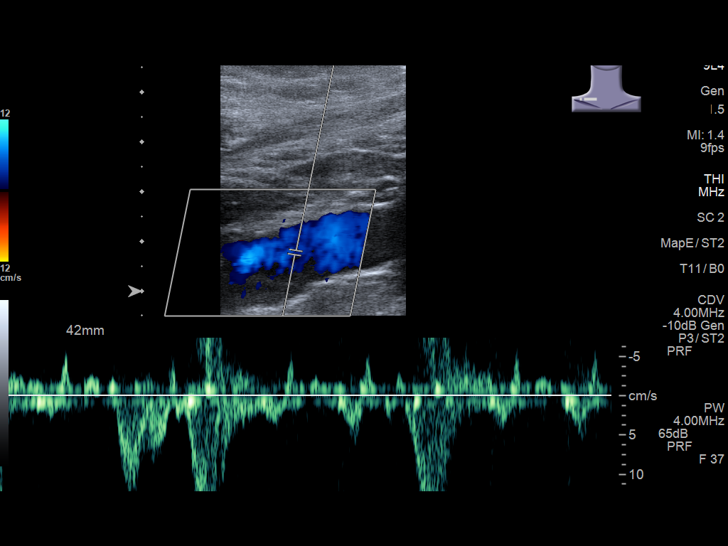

[13 of 24 positions shown; findings below may reference images not displayed]

FINDINGS: Contralateral Subclavian Vein: Respiratory phasicity is normal and
symmetric with the symptomatic side. No evidence of thrombus. Normal
compressibility.

Internal Jugular Vein: No evidence of thrombus. Normal
compressibility, respiratory phasicity and response to augmentation.

Subclavian Vein: No evidence of thrombus. Normal compressibility,
respiratory phasicity and response to augmentation.

Axillary Vein: No evidence of thrombus. Normal compressibility,
respiratory phasicity and response to augmentation.

Cephalic Vein: There is nonocclusive thrombus which extends from the
distal cephalic vein in the hand to the mid upper arm.

Basilic Vein: No evidence of thrombus. Normal compressibility,
respiratory phasicity and response to augmentation.

Brachial Veins: No evidence of thrombus. Normal compressibility,
respiratory phasicity and response to augmentation.

Radial Veins: No evidence of thrombus. Normal compressibility,
respiratory phasicity and response to augmentation.

Ulnar Veins: No evidence of thrombus. Normal compressibility,
respiratory phasicity and response to augmentation.

Other Findings:  None visualized.
IMPRESSION: 1. No DVT in the left upper extremity.
2. There is nonocclusive thrombus of the left cephalic vein
(superficial vein) extending from the distal forearm to the mid
upper arm.

## 2023-06-13 NOTE — Patient Instructions (Signed)
 Be Involved in Caring For Your Health:  Taking Medications When medications are taken as directed, they can greatly improve your health. But if they are not taken as prescribed, they may not work. In some cases, not taking them correctly can be harmful. To help ensure your treatment remains effective and safe, understand your medications and how to take them. Bring your medications to each visit for review by your provider.  Your lab results, notes, and after visit summary will be available on My Chart. We strongly encourage you to use this feature. If lab results are abnormal the clinic will contact you with the appropriate steps. If the clinic does not contact you assume the results are satisfactory. You can always view your results on My Chart. If you have questions regarding your health or results, please contact the clinic during office hours. You can also ask questions on My Chart.  We at [facility] are grateful that you chose [place] to provide your care. We strive to provide evidence-based and compassionate care and are always looking for feedback. If you get a survey from the clinic please complete this so we can hear your opinions.  Heart-Healthy Eating Plan Many factors influence your heart health, including eating and exercise habits. Heart health is also called coronary health. Coronary risk increases with abnormal blood fat (lipid) levels. A heart-healthy eating plan includes limiting unhealthy fats, increasing healthy fats, limiting salt (sodium) intake, and making other diet and lifestyle changes. What is my plan? Your health care provider may recommend that: You limit your fat intake to _________% or less of your total calories each day. You limit your saturated fat intake to _________% or less of your total calories each day. You limit the amount of cholesterol in your diet to less than _________ mg per day. You limit the amount of sodium in your diet to less than _________  mg per day. What are tips for following this plan? Cooking Cook foods using methods other than frying. Baking, boiling, grilling, and broiling are all good options. Other ways to reduce fat include: Removing the skin from poultry. Removing all visible fats from meats. Steaming vegetables in water or broth. Meal planning  At meals, imagine dividing your plate into fourths: Fill one-half of your plate with vegetables and green salads. Fill one-fourth of your plate with whole grains. Fill one-fourth of your plate with lean protein foods. Eat 2-4 cups of vegetables per day. One cup of vegetables equals 1 cup (91 g) broccoli or cauliflower florets, 2 medium carrots, 1 large bell pepper, 1 large sweet potato, 1 large tomato, 1 medium white potato, 2 cups (150 g) raw leafy greens. Eat 1-2 cups of fruit per day. One cup of fruit equals 1 small apple, 1 large banana, 1 cup (237 g) mixed fruit, 1 large orange,  cup (82 g) dried fruit, 1 cup (240 mL) 100% fruit juice. Eat more foods that contain soluble fiber. Examples include apples, broccoli, carrots, beans, peas, and barley. Aim to get 25-30 g of fiber per day. Increase your consumption of legumes, nuts, and seeds to 4-5 servings per week. One serving of dried beans or legumes equals  cup (90 g) cooked, 1 serving of nuts is  oz (12 almonds, 24 pistachios, or 7 walnut halves), and 1 serving of seeds equals  oz (8 g). Fats Choose healthy fats more often. Choose monounsaturated and polyunsaturated fats, such as olive and canola oils, avocado oil, flaxseeds, walnuts, almonds, and seeds. Eat  more omega-3 fats. Choose salmon, mackerel, sardines, tuna, flaxseed oil, and ground flaxseeds. Aim to eat fish at least 2 times each week. Check food labels carefully to identify foods with trans fats or high amounts of saturated fat. Limit saturated fats. These are found in animal products, such as meats, butter, and cream. Plant sources of saturated fats  include palm oil, palm kernel oil, and coconut oil. Avoid foods with partially hydrogenated oils in them. These contain trans fats. Examples are stick margarine, some tub margarines, cookies, crackers, and other baked goods. Avoid fried foods. General information Eat more home-cooked food and less restaurant, buffet, and fast food. Limit or avoid alcohol. Limit foods that are high in added sugar and simple starches such as foods made using white refined flour (white breads, pastries, sweets). Lose weight if you are overweight. Losing just 5-10% of your body weight can help your overall health and prevent diseases such as diabetes and heart disease. Monitor your sodium intake, especially if you have high blood pressure. Talk with your health care provider about your sodium intake. Try to incorporate more vegetarian meals weekly. What foods should I eat? Fruits All fresh, canned (in natural juice), or frozen fruits. Vegetables Fresh or frozen vegetables (raw, steamed, roasted, or grilled). Green salads. Grains Most grains. Choose whole wheat and whole grains most of the time. Rice and pasta, including brown rice and pastas made with whole wheat. Meats and other proteins Lean, well-trimmed beef, veal, pork, and lamb. Chicken and [place] without skin. All fish and shellfish. Wild duck, rabbit, pheasant, and venison. Egg whites or low-cholesterol egg substitutes. Dried beans, peas, lentils, and tofu. Seeds and most nuts. Dairy Low-fat or nonfat cheeses, including ricotta and mozzarella. Skim or 1% milk (liquid, powdered, or evaporated). Buttermilk made with low-fat milk. Nonfat or low-fat yogurt. Fats and oils Non-hydrogenated (trans-free) margarines. Vegetable oils, including soybean, sesame, sunflower, olive, avocado, peanut, safflower, corn, canola, and cottonseed. Salad dressings or mayonnaise made with a vegetable oil. Beverages Water (mineral or sparkling). Coffee and tea. Unsweetened ice  tea. Diet beverages. Sweets and desserts Sherbet, gelatin, and fruit ice. Small amounts of dark chocolate. Limit all sweets and desserts. Seasonings and condiments All seasonings and condiments. The items listed above may not be a complete list of foods and beverages you can eat. Contact a dietitian for more options. What foods should I avoid? Fruits Canned fruit in heavy syrup. Fruit in cream or butter sauce. Fried fruit. Limit coconut. Vegetables Vegetables cooked in cheese, cream, or butter sauce. Fried vegetables. Grains Breads made with saturated or trans fats, oils, or whole milk. Croissants. Sweet rolls. Donuts. High-fat crackers, such as cheese crackers and chips. Meats and other proteins Fatty meats, such as hot dogs, ribs, sausage, bacon, rib-eye roast or steak. High-fat deli meats, such as salami and bologna. Caviar. Domestic duck and goose. Organ meats, such as liver. Dairy Cream, sour cream, cream cheese, and creamed cottage cheese. Whole-milk cheeses. Whole or 2% milk (liquid, evaporated, or condensed). Whole buttermilk. Cream sauce or high-fat cheese sauce. Whole-milk yogurt. Fats and oils Meat fat, or shortening. Cocoa butter, hydrogenated oils, palm oil, coconut oil, palm kernel oil. Solid fats and shortenings, including bacon fat, salt pork, lard, and butter. Nondairy cream substitutes. Salad dressings with cheese or sour cream. Beverages Regular sodas and any drinks with added sugar. Sweets and desserts Frosting. Pudding. Cookies. Cakes. Pies. Milk chocolate or white chocolate. Buttered syrups. Full-fat ice cream or ice cream drinks. The items listed above may  not be a complete list of foods and beverages to avoid. Contact a dietitian for more information. Summary Heart-healthy meal planning includes limiting unhealthy fats, increasing healthy fats, limiting salt (sodium) intake and making other diet and lifestyle changes. Lose weight if you are overweight. Losing just  5-10% of your body weight can help your overall health and prevent diseases such as diabetes and heart disease. Focus on eating a balance of foods, including fruits and vegetables, low-fat or nonfat dairy, lean protein, nuts and legumes, whole grains, and heart-healthy oils and fats. This information is not intended to replace advice given to you by your health care provider. Make sure you discuss any questions you have with your health care provider. Document Revised: 03/11/2021 Document Reviewed: 03/11/2021 [employer] Patient Education  2024 [employer].

## 2023-06-15 ENCOUNTER — Encounter: Payer: Self-pay | Admitting: Nurse Practitioner

## 2023-06-15 ENCOUNTER — Ambulatory Visit (INDEPENDENT_AMBULATORY_CARE_PROVIDER_SITE_OTHER): Admitting: Nurse Practitioner

## 2023-06-15 VITALS — BP 130/86 | HR 59 | Temp 98.0°F | Ht 70.5 in | Wt 266.0 lb

## 2023-06-15 DIAGNOSIS — Z0000 Encounter for general adult medical examination without abnormal findings: Secondary | ICD-10-CM

## 2023-06-15 DIAGNOSIS — R7989 Other specified abnormal findings of blood chemistry: Secondary | ICD-10-CM

## 2023-06-15 DIAGNOSIS — G4733 Obstructive sleep apnea (adult) (pediatric): Secondary | ICD-10-CM

## 2023-06-15 DIAGNOSIS — Z8249 Family history of ischemic heart disease and other diseases of the circulatory system: Secondary | ICD-10-CM

## 2023-06-15 DIAGNOSIS — N400 Enlarged prostate without lower urinary tract symptoms: Secondary | ICD-10-CM

## 2023-06-15 DIAGNOSIS — F418 Other specified anxiety disorders: Secondary | ICD-10-CM | POA: Diagnosis not present

## 2023-06-15 DIAGNOSIS — R7301 Impaired fasting glucose: Secondary | ICD-10-CM

## 2023-06-15 DIAGNOSIS — E7800 Pure hypercholesterolemia, unspecified: Secondary | ICD-10-CM

## 2023-06-15 DIAGNOSIS — E6609 Other obesity due to excess calories: Secondary | ICD-10-CM

## 2023-06-15 DIAGNOSIS — E559 Vitamin D deficiency, unspecified: Secondary | ICD-10-CM

## 2023-06-15 DIAGNOSIS — K219 Gastro-esophageal reflux disease without esophagitis: Secondary | ICD-10-CM

## 2023-06-15 DIAGNOSIS — E66812 Obesity, class 2: Secondary | ICD-10-CM

## 2023-06-15 DIAGNOSIS — I10 Essential (primary) hypertension: Secondary | ICD-10-CM

## 2023-06-15 LAB — BAYER DCA HB A1C WAIVED: HB A1C (BAYER DCA - WAIVED): 5.4 % (ref 4.8–5.6)

## 2023-06-15 MED ORDER — AMLODIPINE BESYLATE 5 MG PO TABS
5.0000 mg | ORAL_TABLET | Freq: Every day | ORAL | 4 refills | Status: DC
Start: 2023-06-15 — End: 2023-07-30

## 2023-06-15 NOTE — Assessment & Plan Note (Signed)
BMI 37.63.  Recommended eating smaller high protein, low fat meals more frequently and exercising 30 mins a day 5 times a week with a goal of 10-15lb weight loss in the next 3 months. Patient voiced their understanding and motivation to adhere to these recommendations.

## 2023-06-15 NOTE — Assessment & Plan Note (Signed)
Consider repeat sleep study in future, at this time continue side sleeping as instructed. 

## 2023-06-15 NOTE — Assessment & Plan Note (Signed)
 Noted on past labs, check lipid panel today and CMP.  ASCVD 4%, diet focus at this time.

## 2023-06-15 NOTE — Assessment & Plan Note (Signed)
Ongoing, stable.  Continue supplement daily and recheck level today.

## 2023-06-15 NOTE — Progress Notes (Signed)
 BP 130/86 (BP Location: Left Arm, Cuff Size: Normal)   Pulse (!) 59   Temp 98 F (36.7 C) (Oral)   Ht 5' 10.5" (1.791 m)   Wt 266 lb (120.7 kg)   SpO2 98%   BMI 37.63 kg/m    Subjective:    Patient ID: Joe Gutierrez, male    DOB: 27-Nov-1975, 48 y.o.   MRN: [id number]  HPI: Joe Gutierrez is a 48 y.o. male presenting on 06/15/2023 for comprehensive medical examination. Current medical complaints include:none  He currently lives with: wife and children Interim Problems from his last visit: no  HYPERTENSION / HYPERLIPIDEMIA Takes Lisinopril .  No current statin therapy. OSA present, sleeps on side per recommendations.  He is father's age when he had to have bypass surgery, although he was a smoker and drank alcohol which patient does not. Satisfied with current treatment? yes Duration of hypertension: chronic BP monitoring frequency: at nightly BP range: a little higher at night, >130/80 range BP medication side effects: no Duration of hyperlipidemia: chronic Aspirin: no Recent stressors: no Recurrent headaches: no Visual changes: no Palpitations: at times beats faster when active Dyspnea: yes -- has been present over the past 2 months, gotten worse over that time.  Getting to 3rd floor at the apartment complex causes fatigue. Chest pain: at baseline with heart burn Lower extremity edema: no Dizzy/lightheaded: occasional The 10-year ASCVD risk score (Arnett DK, et al., 2019) is: 4%   Values used to calculate the score:     Age: 45 years     Sex: Male     Is Non-Hispanic African American: No     Diabetic: No     Tobacco smoker: No     Systolic Blood Pressure: 130 mmHg     Is BP treated: Yes     HDL Cholesterol: 34 mg/dL     Total Cholesterol: 178 mg/dL  GERD Continues to have issues with heart burn, did take Voquenza which helped for a short period. GERD control status: uncontrolled Satisfied with current treatment? yes Heartburn frequency: 3/4 of a month will have  episodes Medication side effects: no  Medication compliance: stable Previous GERD medications: Pepcid , PPIs, Voquenza Antacid use frequency:  none Nature: burning and painful sensation in chest from epigastric area and up Location: as above Heartburn duration: all day Alleviatiating factors: nothing Aggravating factors: nothing Dysphagia: no Odynophagia:  no Hematemesis: no Blood in stool: no EGD: yes   Impaired Fasting Glucose HbA1C:  Lab Results  Component Value Date   HGBA1C 5.7 (H) 06/09/2022  Duration of elevated blood sugar: years Polydipsia: no Polyuria: no Weight change: no Visual disturbance: no Glucose Monitoring: no    Accucheck frequency: Not Checking    Fasting glucose:     Post prandial:  Diabetic Education: Not Completed Family history of diabetes: yes mother and grandmother  DEPRESSION & ANXIETY Continues on Wellbutrin  and Zoloft  with benefit.  Takes Vitamin D  for low levels in past + follows with urology for low testosterone  and BPH.  Last visit was 01/30/23.  Takes injections. Mood status: stable Satisfied with current treatment?: yes Symptom severity: moderate  Duration of current treatment : chronic Side effects: no Medication compliance: good compliance Psychotherapy/counseling: none Depressed mood: no Anxious mood: no Anhedonia: no Significant weight loss or gain: no Insomnia: occasionally Fatigue: yes Feelings of worthlessness or guilt: no Impaired concentration/indecisiveness: no Suicidal ideations: no Hopelessness: no Crying spells: no    06/15/2023    8:55 AM 09/12/2022  8:57 AM 06/09/2022    3:52 PM 01/17/2022   11:24 AM 12/09/2021    8:14 AM  Depression screen PHQ 2/9  Decreased Interest 1 1 0 1 1  Down, Depressed, Hopeless 0 0 0 2 0  PHQ - 2 Score 1 1 0 3 1  Altered sleeping 1 1 1 2 1   Tired, decreased energy 1 1 1 2 1   Change in appetite 1 0 0 0 0  Feeling bad or failure about yourself  0 0 0 0 0  Trouble concentrating 1  0 1 1 1   Moving slowly or fidgety/restless 0 0 0 0 0  Suicidal thoughts 0 0 0 0   PHQ-9 Score 5 3 3 8 4   Difficult doing work/chores Not difficult at all Not difficult at all Not difficult at all Somewhat difficult Not difficult at all      06/15/2023    8:55 AM 09/12/2022    8:58 AM 06/09/2022    3:53 PM 01/17/2022   11:24 AM  GAD 7 : Generalized Anxiety Score  Nervous, Anxious, on Edge 1 1 1 2   Control/stop worrying 0 0 0 2  Worry too much - different things 1 0 1 1  Trouble relaxing 1 0 0 1  Restless 1 0 0 1  Easily annoyed or irritable 1 1 1 1   Afraid - awful might happen 0 0 0 0  Total GAD 7 Score 5 2 3 8   Anxiety Difficulty Not difficult at all Not difficult at all Not difficult at all Somewhat difficult        12/09/2021    8:18 AM 01/17/2022   11:23 AM 06/09/2022    3:52 PM 09/12/2022    8:57 AM 06/15/2023    8:54 AM  Fall Risk  Falls in the past year? 0 0 0 0 0  Was there an injury with Fall? 0 0 0 0 0  Fall Risk Category Calculator 0 0 0 0 0  Fall Risk Category (Retired) Low Low     (RETIRED) Patient Fall Risk Level Low fall risk Low fall risk     Patient at Risk for Falls Due to No Fall Risks No Fall Risks No Fall Risks No Fall Risks No Fall Risks  Fall risk Follow up Falls prevention discussed Falls evaluation completed Falls evaluation completed Falls evaluation completed Falls evaluation completed    Past Medical History:  Past Medical History:  Diagnosis Date   Anxiety    Arthritis    Colon polyp    Depression    Heart murmur    History of kidney stones    Hypertension    Hypogonadism in male    Obesity    Sleep apnea     Surgical History:  Past Surgical History:  Procedure Laterality Date   APPENDECTOMY     CLOSED REDUCTION NASAL FRACTURE N/A 06/11/2021   Procedure: CLOSED REDUCTION NASAL FRACTURE;  Surgeon: Barb Bonito, MD;  Location: [facility] OR;  Service: Plastics;  Laterality: N/A;   COLONOSCOPY N/A 06/17/2022   Procedure: COLONOSCOPY;  Surgeon:  Luke Salaam, MD;  Location: [facility] ENDOSCOPY;  Service: Gastroenterology;  Laterality: N/A;   COLONOSCOPY WITH PROPOFOL  N/A 11/16/2020   Procedure: COLONOSCOPY WITH PROPOFOL ;  Surgeon: Luke Salaam, MD;  Location: [facility] ENDOSCOPY;  Service: Gastroenterology;  Laterality: N/A;   COLONOSCOPY WITH PROPOFOL  N/A 07/21/2022   Procedure: COLONOSCOPY WITH PROPOFOL ;  Surgeon: Luke Salaam, MD;  Location: [facility] ENDOSCOPY;  Service: Gastroenterology;  Laterality: N/A;  ESOPHAGOGASTRODUODENOSCOPY (EGD) WITH PROPOFOL  N/A 06/17/2022   Procedure: ESOPHAGOGASTRODUODENOSCOPY (EGD) WITH PROPOFOL ;  Surgeon: Luke Salaam, MD;  Location: [facility] ENDOSCOPY;  Service: Gastroenterology;  Laterality: N/A;   LAPAROSCOPIC RIGHT COLECTOMY N/A 01/24/2021   Procedure: LAPAROSCOPIC RIGHT COLECTOMY with Gabriela Johns, PA-C assisting;  Surgeon: Alben Alma, MD;  Location: [facility] ORS;  Service: General;  Laterality: N/A;   LITHOTRIPSY      Medications:  Current Outpatient Medications on File Prior to Visit  Medication Sig   buPROPion  (WELLBUTRIN  XL) 150 MG 24 hr tablet TAKE 1 TABLET BY MOUTH EVERY DAY   cyclobenzaprine  (FLEXERIL ) 10 MG tablet Take 1 tablet (10 mg total) by mouth 3 (three) times daily as needed for muscle spasms.   lisinopril  (ZESTRIL ) 40 MG tablet TAKE 1 TABLET BY MOUTH EVERY DAY   NEEDLE, DISP, 18 G (BD DISP NEEDLES) 18G X 1-1/2" MISC 1 mg by Does not apply route every 14 (fourteen) days.   NEEDLE, DISP, 21 G (BD DISP NEEDLES) 21G X 1-1/2" MISC 1 mg by Does not apply route every 14 (fourteen) days.   sertraline  (ZOLOFT ) 100 MG tablet Take 2 tablets (200 mg total) by mouth daily.   Syringe, Disposable, (2-3CC SYRINGE) 3 ML MISC 1 mg by Does not apply route every 14 (fourteen) days.   testosterone  cypionate (DEPOTESTOSTERONE CYPIONATE) 200 MG/ML injection Inject 1 mL (200 mg total) into the muscle every 14 (fourteen) days.   Vitamin D , Cholecalciferol, 25 MCG (1000 UT) TABS Take 1,000 Units by mouth daily.   No current  facility-administered medications on file prior to visit.    Allergies:  Allergies  Allergen Reactions   Codeine Hives   Sulfa Antibiotics Hives    Social History:  Social History   Socioeconomic History   Marital status: Married    Spouse name: Tabatha   Number of children: 3   Years of education: Not on file   Highest education level: Not on file  Occupational History   Not on file  Tobacco Use   Smoking status: Never   Smokeless tobacco: Never  Vaping Use   Vaping status: Never Used  Substance and Sexual Activity   Alcohol use: No   Drug use: No   Sexual activity: Yes  Other Topics Concern   Not on file  Social History Narrative   Not on file   Social Drivers of Health   Financial Resource Strain: Low Risk  (06/15/2023)   Overall Financial Resource Strain (CARDIA)    Difficulty of Paying Living Expenses: Not hard at all  Food Insecurity: No Food Insecurity (06/15/2023)   Hunger Vital Sign    Worried About Running Out of Food in the Last Year: Never true    Ran Out of Food in the Last Year: Never true  Transportation Needs: No Transportation Needs (06/15/2023)   PRAPARE - Administrator, Civil Service (Medical): No    Lack of Transportation (Non-Medical): No  Physical Activity: Inactive (06/15/2023)   Exercise Vital Sign    Days of Exercise per Week: 0 days    Minutes of Exercise per Session: 0 min  Stress: No Stress Concern Present (06/15/2023)   [employer] of Occupational Health - Occupational Stress Questionnaire    Feeling of Stress : Not at all  Social Connections: Moderately Integrated (06/15/2023)   Social Connection and Isolation Panel [NHANES]    Frequency of Communication with Friends and Family: More than three times a week    Frequency of Social Gatherings  with Friends and Family: More than three times a week    Attends Religious Services: More than 4 times per year    Active Member of Clubs or Organizations: No    Attends Tax inspector Meetings: Never    Marital Status: Married  Catering manager Violence: Not At Risk (06/15/2023)   Humiliation, Afraid, Rape, and Kick questionnaire    Fear of Current or Ex-Partner: No    Emotionally Abused: No    Physically Abused: No    Sexually Abused: No   Social History   Tobacco Use  Smoking Status Never  Smokeless Tobacco Never   Social History   Substance and Sexual Activity  Alcohol Use No    Family History:  Family History  Problem Relation Age of Onset   Diabetes Mother    Hypertension Mother    Alcohol abuse Father    Heart disease Father    Hypertension Father    Diabetes Maternal Grandmother    Stroke Maternal Grandfather    Hypertension Brother    Asthma Daughter    Heart disease Paternal Grandfather     Past medical history, surgical history, medications, allergies, family history and social history reviewed with patient today and changes made to appropriate areas of the chart.   ROS All other ROS negative except what is listed above and in the HPI.      Objective:    BP 130/86 (BP Location: Left Arm, Cuff Size: Normal)   Pulse (!) 59   Temp 98 F (36.7 C) (Oral)   Ht 5' 10.5" (1.791 m)   Wt 266 lb (120.7 kg)   SpO2 98%   BMI 37.63 kg/m   Wt Readings from Last 3 Encounters:  06/15/23 266 lb (120.7 kg)  01/30/23 250 lb (113.4 kg)  09/12/22 259 lb 12.8 oz (117.8 kg)    Physical Exam Vitals and nursing note reviewed.  Constitutional:      General: He is awake. He is not in acute distress.    Appearance: He is well-developed and well-groomed. He is obese. He is not ill-appearing or toxic-appearing.  HENT:     Head: Normocephalic and atraumatic.     Right Ear: Hearing, tympanic membrane, ear canal and external ear normal. No drainage.     Left Ear: Hearing, tympanic membrane, ear canal and external ear normal. No drainage.     Nose: Nose normal.     Mouth/Throat:     Pharynx: Uvula midline.  Eyes:     General: Lids are  normal.        Right eye: No discharge.        Left eye: No discharge.     Extraocular Movements: Extraocular movements intact.     Conjunctiva/sclera: Conjunctivae normal.     Pupils: Pupils are equal, round, and reactive to light.     Visual Fields: Right eye visual fields normal and left eye visual fields normal.  Neck:     Thyroid: No thyromegaly.     Vascular: No carotid bruit or JVD.     Trachea: Trachea normal.  Cardiovascular:     Rate and Rhythm: Normal rate and regular rhythm.     Heart sounds: Normal heart sounds, S1 normal and S2 normal. No murmur heard.    No gallop.  Pulmonary:     Effort: Pulmonary effort is normal. No accessory muscle usage or respiratory distress.     Breath sounds: Normal breath sounds.  Abdominal:     General:  Bowel sounds are normal.     Palpations: Abdomen is soft. There is no hepatomegaly or splenomegaly.     Tenderness: There is no abdominal tenderness.  Musculoskeletal:        General: Normal range of motion.     Cervical back: Normal range of motion and neck supple.     Right lower leg: No edema.     Left lower leg: No edema.  Lymphadenopathy:     Head:     Right side of head: No submental, submandibular, tonsillar, preauricular or posterior auricular adenopathy.     Left side of head: No submental, submandibular, tonsillar, preauricular or posterior auricular adenopathy.     Cervical: No cervical adenopathy.  Skin:    General: Skin is warm and dry.     Capillary Refill: Capillary refill takes less than 2 seconds.     Findings: No rash.  Neurological:     Mental Status: He is alert and oriented to person, place, and time.     Gait: Gait is intact.     Deep Tendon Reflexes: Reflexes are normal and symmetric.     Reflex Scores:      Brachioradialis reflexes are 2+ on the right side and 2+ on the left side.      Patellar reflexes are 2+ on the right side and 2+ on the left side. Psychiatric:        Attention and Perception: Attention  normal.        Mood and Affect: Mood normal.        Speech: Speech normal.        Behavior: Behavior normal. Behavior is cooperative.        Thought Content: Thought content normal.        Cognition and Memory: Cognition normal.   EKG My review and personal interpretation at Time: 0920    Indication: elevated HR  Rate: 60  Rhythm: sinus Axis: left axis shift Other: No nonspecific st abn, no stemi, no lvh (similar to previous EKGs on exam today)   Results for orders placed or performed in visit on 01/29/23  PSA   Collection Time: 01/29/23  8:07 AM  Result Value Ref Range   Prostate Specific Ag, Serum 1.5 0.0 - 4.0 ng/mL  Testosterone    Collection Time: 01/29/23  8:07 AM  Result Value Ref Range   Testosterone  301 264 - 916 ng/dL  Hemoglobin and Hematocrit, Blood   Collection Time: 01/29/23  8:07 AM  Result Value Ref Range   Hemoglobin 15.5 13.0 - 17.7 g/dL   Hematocrit 40.9 81.1 - 51.0 %      Assessment & Plan:   Problem List Items Addressed This Visit       Cardiovascular and Mediastinum   Essential hypertension, benign   Chronic, stable.  BP above goal on initial and recheck today.  Continue Lisinopril  40 MG at this time and add on Amlodipine 5 MG daily.  Educated him on this medication and side effects to report.  Recommend he continue to monitor BP at home and document + focus on DASH diet.  To notify provider if any dry cough presents.  LABS: CBC, TSH, CMP, urine alb.        Relevant Medications   amLODipine (NORVASC) 5 MG tablet   Other Relevant Orders   Microalbumin, Urine Waived   CBC with Differential/Platelet   Comprehensive metabolic panel with GFR   TSH   EKG 12-Lead   Ambulatory referral to Cardiology  Respiratory   Sleep apnea   Consider repeat sleep study in future, at this time continue side sleeping as instructed.        Digestive   Gastroesophageal reflux disease without esophagitis   Chronic, ongoing issue.  No benefit from multiple  medications.  Continue Pepcid  as needed at present.  Will get back into see Dr. Antony Baumgartner since he has had no benefit from multiple prescriptions, including Voquenza.  Recommend modest weight loss and focus on healthy diet changes.      Relevant Orders   Ambulatory referral to Gastroenterology     Endocrine   IFG (impaired fasting glucose)   A1c 5.4% today, downward trend since last visit.  Recommend continued focus on diet and exercise.      Relevant Orders   Bayer DCA Hb A1c Waived   Microalbumin, Urine Waived     Genitourinary   Benign prostatic hyperplasia without lower urinary tract symptoms   History of enlarged prostate on exams, follows with urology.  Continue this collaboration, recent note and labs reviewed.        Other   Vitamin D  deficiency   Ongoing, stable.  Continue supplement daily and recheck level today.      Relevant Orders   VITAMIN D  25 Hydroxy (Vit-D Deficiency, Fractures)   Obesity   BMI 37.63  Recommended eating smaller high protein, low fat meals more frequently and exercising 30 mins a day 5 times a week with a goal of 10-15lb weight loss in the next 3 months. Patient voiced their understanding and motivation to adhere to these recommendations.       Low testosterone  in male   Ongoing.  Will continue to follow along with urology, recent notes reviewed.      Family history of heart disease   Father had bypass surgery at 32.  He was smoker and drank alcohol regularly.  Patient concerned about his heart health.  Referral to cardiology for reassurance.  EKG today reassuring and similar to previous EKGs.      Relevant Orders   EKG 12-Lead   Ambulatory referral to Cardiology   Elevated low density lipoprotein (LDL) cholesterol level   Noted on past labs, check lipid panel today and CMP.  ASCVD 4%, diet focus at this time.      Relevant Orders   Comprehensive metabolic panel with GFR   Lipid Panel w/o Chol/HDL Ratio   EKG 12-Lead   Ambulatory  referral to Cardiology   Depression with anxiety - Primary   Chronic, stable. Continue Sertraline  to 200 MG (max dose) and Wellbutrin  XL 150 MG daily.  Denies SI/HI. Return in 6 months. Continue to collaborate with urology on low testosterone .      Other Visit Diagnoses       Encounter for annual physical exam       Annual physical today with labs and health maintenance reviewed, discussed with patient.       Discussed aspirin prophylaxis for myocardial infarction prevention and decision was it was not indicated  LABORATORY TESTING:  Health maintenance labs ordered today as discussed above.   The natural history of prostate cancer and ongoing controversy regarding screening and potential treatment outcomes of prostate cancer has been discussed with the patient. The meaning of a false positive PSA and a false negative PSA has been discussed. He indicates understanding of the limitations of this screening test and wishes  to proceed with screening PSA testing. Has done with urology.   IMMUNIZATIONS:   -  Tdap: Tetanus vaccination status reviewed: last tetanus booster within 10 years. - Influenza: Up to date - Pneumovax: Not applicable - Prevnar: Not applicable - Zostavax vaccine: Not applicable - Covid: refuses  SCREENING: - Colonoscopy: Up to date  Discussed with patient purpose of the colonoscopy is to detect colon cancer at curable precancerous or early stages   - AAA Screening: Not applicable  -Hearing Test: Not applicable  -Spirometry: Not applicable   PATIENT COUNSELING:    Sexuality: Discussed sexually transmitted diseases, partner selection, use of condoms, avoidance of unintended pregnancy  and contraceptive alternatives.   Advised to avoid cigarette smoking.  I discussed with the patient that most people either abstain from alcohol or drink within safe limits (<=14/week and <=4 drinks/occasion for males, <=7/weeks and <= 3 drinks/occasion for females) and that the  risk for alcohol disorders and other health effects rises proportionally with the number of drinks per week and how often a drinker exceeds daily limits.  Discussed cessation/primary prevention of drug use and availability of treatment for abuse.   Diet: Encouraged to adjust caloric intake to maintain  or achieve ideal body weight, to reduce intake of dietary saturated fat and total fat, to limit sodium intake by avoiding high sodium foods and not adding table salt, and to maintain adequate dietary potassium and calcium preferably from fresh fruits, vegetables, and low-fat dairy products.    Stressed the importance of regular exercise  Injury prevention: Discussed safety belts, safety helmets, smoke detector, smoking near bedding or upholstery.   Dental health: Discussed importance of regular tooth brushing, flossing, and dental visits.   Follow up plan: NEXT PREVENTATIVE PHYSICAL DUE IN 1 YEAR. Return in about 4 weeks (around 07/13/2023) for HTN -- added Amlodipine 5 MG to regimen.

## 2023-06-15 NOTE — Assessment & Plan Note (Signed)
Ongoing.  Will continue to follow along with urology, recent notes reviewed.

## 2023-06-15 NOTE — Assessment & Plan Note (Signed)
 History of enlarged prostate on exams, follows with urology.  Continue this collaboration, recent note and labs reviewed.

## 2023-06-15 NOTE — Assessment & Plan Note (Addendum)
 Father had bypass surgery at 25.  He was smoker and drank alcohol regularly.  Patient concerned about his heart health.  Referral to cardiology for reassurance.  EKG today reassuring and similar to previous EKGs.

## 2023-06-15 NOTE — Assessment & Plan Note (Signed)
 Chronic, stable.  BP above goal on initial and recheck today.  Continue Lisinopril  40 MG at this time and add on Amlodipine 5 MG daily.  Educated him on this medication and side effects to report.  Recommend he continue to monitor BP at home and document + focus on DASH diet.  To notify provider if any dry cough presents.  LABS: CBC, TSH, CMP, urine alb.

## 2023-06-15 NOTE — Assessment & Plan Note (Signed)
 Chronic, ongoing issue.  No benefit from multiple medications.  Continue Pepcid  as needed at present.  Will get back into see Dr. Antony Baumgartner since he has had no benefit from multiple prescriptions, including Voquenza.  Recommend modest weight loss and focus on healthy diet changes.

## 2023-06-15 NOTE — Assessment & Plan Note (Signed)
 Chronic, stable. Continue Sertraline  to 200 MG (max dose) and Wellbutrin  XL 150 MG daily.  Denies SI/HI. Return in 6 months. Continue to collaborate with urology on low testosterone .

## 2023-06-15 NOTE — Assessment & Plan Note (Signed)
 A1c 5.4% today, downward trend since last visit.  Recommend continued focus on diet and exercise.

## 2023-06-16 ENCOUNTER — Encounter: Payer: Self-pay | Admitting: Nurse Practitioner

## 2023-06-16 LAB — CBC WITH DIFFERENTIAL/PLATELET
Basophils Absolute: 0 10*3/uL (ref 0.0–0.2)
Basos: 0 %
EOS (ABSOLUTE): 0.2 10*3/uL (ref 0.0–0.4)
Eos: 2 %
Hematocrit: 46.6 % (ref 37.5–51.0)
Hemoglobin: 15.2 g/dL (ref 13.0–17.7)
Immature Grans (Abs): 0 10*3/uL (ref 0.0–0.1)
Immature Granulocytes: 0 %
Lymphocytes Absolute: 1.9 10*3/uL (ref 0.7–3.1)
Lymphs: 27 %
MCH: 28.9 pg (ref 26.6–33.0)
MCHC: 32.6 g/dL (ref 31.5–35.7)
MCV: 89 fL (ref 79–97)
Monocytes Absolute: 0.6 10*3/uL (ref 0.1–0.9)
Monocytes: 8 %
Neutrophils Absolute: 4.5 10*3/uL (ref 1.4–7.0)
Neutrophils: 63 %
Platelets: 230 10*3/uL (ref 150–450)
RBC: 5.26 x10E6/uL (ref 4.14–5.80)
RDW: 12.7 % (ref 11.6–15.4)
WBC: 7.2 10*3/uL (ref 3.4–10.8)

## 2023-06-16 LAB — TSH: TSH: 1.62 u[IU]/mL (ref 0.450–4.500)

## 2023-06-16 LAB — VITAMIN D 25 HYDROXY (VIT D DEFICIENCY, FRACTURES): Vit D, 25-Hydroxy: 33.5 ng/mL (ref 30.0–100.0)

## 2023-06-16 LAB — COMPREHENSIVE METABOLIC PANEL WITH GFR
ALT: 22 IU/L (ref 0–44)
AST: 20 IU/L (ref 0–40)
Albumin: 4.4 g/dL (ref 4.1–5.1)
Alkaline Phosphatase: 84 IU/L (ref 44–121)
BUN/Creatinine Ratio: 11 (ref 9–20)
BUN: 10 mg/dL (ref 6–24)
Bilirubin Total: 0.3 mg/dL (ref 0.0–1.2)
CO2: 24 mmol/L (ref 20–29)
Calcium: 9 mg/dL (ref 8.7–10.2)
Chloride: 103 mmol/L (ref 96–106)
Creatinine, Ser: 0.92 mg/dL (ref 0.76–1.27)
Globulin, Total: 2.2 g/dL (ref 1.5–4.5)
Glucose: 92 mg/dL (ref 70–99)
Potassium: 4.3 mmol/L (ref 3.5–5.2)
Sodium: 139 mmol/L (ref 134–144)
Total Protein: 6.6 g/dL (ref 6.0–8.5)
eGFR: 103 mL/min/{1.73_m2} (ref >59)

## 2023-06-16 LAB — LIPID PANEL W/O CHOL/HDL RATIO
Cholesterol, Total: 197 mg/dL (ref 100–199)
HDL: 36 mg/dL — ABNORMAL LOW (ref >39)
LDL Chol Calc (NIH): 130 mg/dL — ABNORMAL HIGH (ref 0–99)
Triglycerides: 175 mg/dL — ABNORMAL HIGH (ref 0–149)
VLDL Cholesterol Cal: 31 mg/dL (ref 5–40)

## 2023-06-16 NOTE — Progress Notes (Signed)
 Contacted via MyChart   Good afternoon Joe Gutierrez, your labs have returned: - Lipid panel continues to show some elevation in triglycerides only these are a little lower. Continue heavy focus on diet and regular activity. - Remainder of labs look great, no changes needed.  Any questions? Keep being amazing!!  Thank you for allowing me to participate in your care.  I appreciate you. Kindest regards, Shneur [place]

## 2023-07-02 ENCOUNTER — Encounter: Payer: Self-pay | Admitting: Nurse Practitioner

## 2023-07-12 NOTE — Patient Instructions (Incomplete)
 Be Involved in Caring For Your Health:  Taking Medications When medications are taken as directed, they can greatly improve your health. But if they are not taken as prescribed, they may not work. In some cases, not taking them correctly can be harmful. To help ensure your treatment remains effective and safe, understand your medications and how to take them. Bring your medications to each visit for review by your provider.  Your lab results, notes, and after visit summary will be available on My Chart. We strongly encourage you to use this feature. If lab results are abnormal the clinic will contact you with the appropriate steps. If the clinic does not contact you assume the results are satisfactory. You can always view your results on My Chart. If you have questions regarding your health or results, please contact the clinic during office hours. You can also ask questions on My Chart.  We at [facility] are grateful that you chose [place] to provide your care. We strive to provide evidence-based and compassionate care and are always looking for feedback. If you get a survey from the clinic please complete this so we can hear your opinions.  DASH Eating Plan DASH stands for Dietary Approaches to Stop Hypertension. The DASH eating plan is a healthy eating plan that has been shown to: Lower high blood pressure (hypertension). Reduce your risk for type 2 diabetes, heart disease, and stroke. Help with weight loss. What are tips for following this plan? Reading food labels Check food labels for the amount of salt (sodium) per serving. Choose foods with less than 5 percent of the Daily Value (DV) of sodium. In general, foods with less than 300 milligrams (mg) of sodium per serving fit into this eating plan. To find whole grains, look for the word "whole" as the first word in the ingredient list. Shopping Buy products labeled as "low-sodium" or "no salt added." Buy fresh foods. Avoid canned  foods and pre-made or frozen meals. Cooking Try not to add salt when you cook. Use salt-free seasonings or herbs instead of table salt or sea salt. Check with your health care provider or pharmacist before using salt substitutes. Do not fry foods. Cook foods in healthy ways, such as baking, boiling, grilling, roasting, or broiling. Cook using oils that are good for your heart. These include olive, canola, avocado, soybean, and sunflower oil. Meal planning  Eat a balanced diet. This should include: 4 or more servings of fruits and 4 or more servings of vegetables each day. Try to fill half of your plate with fruits and vegetables. 6-8 servings of whole grains each day. 6 or less servings of lean meat, poultry, or fish each day. 1 oz is 1 serving. A 3 oz (85 g) serving of meat is about the same size as the palm of your hand. One egg is 1 oz (28 g). 2-3 servings of low-fat dairy each day. One serving is 1 cup (237 mL). 1 serving of nuts, seeds, or beans 5 times each week. 2-3 servings of heart-healthy fats. Healthy fats called omega-3 fatty acids are found in foods such as walnuts, flaxseeds, fortified milks, and eggs. These fats are also found in cold-water fish, such as sardines, salmon, and mackerel. Limit how much you eat of: Canned or prepackaged foods. Food that is high in trans fat, such as fried foods. Food that is high in saturated fat, such as fatty meat. Desserts and other sweets, sugary drinks, and other foods with added sugar. Full-fat  dairy products. Do not salt foods before eating. Do not eat more than 4 egg yolks a week. Try to eat at least 2 vegetarian meals a week. Eat more home-cooked food and less restaurant, buffet, and fast food. Lifestyle When eating at a restaurant, ask if your food can be made with less salt or no salt. If you drink alcohol: Limit how much you have to: 0-1 drink a day if you are male. 0-2 drinks a day if you are male. Know how much alcohol is in  your drink. In the [place] one drink is one 12 oz bottle of beer (355 mL), one 5 oz glass of wine (148 mL), or one 1 oz glass of hard liquor (44 mL). General information Avoid eating more than 2,300 mg of salt a day. If you have hypertension, you may need to reduce your sodium intake to 1,500 mg a day. Work with your provider to stay at a healthy body weight or lose weight. Ask what the best weight range is for you. On most days of the week, get at least 30 minutes of exercise that causes your heart to beat faster. This may include walking, swimming, or biking. Work with your provider or dietitian to adjust your eating plan to meet your specific calorie needs. What foods should I eat? Fruits All fresh, dried, or frozen fruit. Canned fruits that are in their natural juice and do not have sugar added to them. Vegetables Fresh or frozen vegetables that are raw, steamed, roasted, or grilled. Low-sodium or reduced-sodium tomato and vegetable juice. Low-sodium or reduced-sodium tomato sauce and tomato paste. Low-sodium or reduced-sodium canned vegetables. Grains Whole-grain or whole-wheat bread. Whole-grain or whole-wheat pasta. Brown rice. Orpah Cobb. Bulgur. Whole-grain and low-sodium cereals. Pita bread. Low-fat, low-sodium crackers. Whole-wheat flour tortillas. Meats and other proteins Skinless chicken or [place]. Ground chicken or [place]. Pork with fat trimmed off. Fish and seafood. Egg whites. Dried beans, peas, or lentils. Unsalted nuts, nut butters, and seeds. Unsalted canned beans. Lean cuts of beef with fat trimmed off. Low-sodium, lean precooked or cured meat, such as sausages or meat loaves. Dairy Low-fat (1%) or fat-free (skim) milk. Reduced-fat, low-fat, or fat-free cheeses. Nonfat, low-sodium ricotta or cottage cheese. Low-fat or nonfat yogurt. Low-fat, low-sodium cheese. Fats and oils Soft margarine without trans fats. Vegetable oil. Reduced-fat, low-fat, or light mayonnaise and salad  dressings (reduced-sodium). Canola, safflower, olive, avocado, soybean, and sunflower oils. Avocado. Seasonings and condiments Herbs. Spices. Seasoning mixes without salt. Other foods Unsalted popcorn and pretzels. Fat-free sweets. The items listed above may not be all the foods and drinks you can have. Talk to a dietitian to learn more. What foods should I avoid? Fruits Canned fruit in a light or heavy syrup. Fried fruit. Fruit in cream or butter sauce. Vegetables Creamed or fried vegetables. Vegetables in a cheese sauce. Regular canned vegetables that are not marked as low-sodium or reduced-sodium. Regular canned tomato sauce and paste that are not marked as low-sodium or reduced-sodium. Regular tomato and vegetable juices that are not marked as low-sodium or reduced-sodium. Rosita Fire. Olives. Grains Baked goods made with fat, such as croissants, muffins, or some breads. Dry pasta or rice meal packs. Meats and other proteins Fatty cuts of meat. Ribs. Fried meat. Tomasa Blase. Bologna, salami, and other precooked or cured meats, such as sausages or meat loaves, that are not lean and low in sodium. Fat from the back of a pig (fatback). Bratwurst. Salted nuts and seeds. Canned beans with added salt. Canned  or smoked fish. Whole eggs or egg yolks. Chicken or [place] with skin. Dairy Whole or 2% milk, cream, and half-and-half. Whole or full-fat cream cheese. Whole-fat or sweetened yogurt. Full-fat cheese. Nondairy creamers. Whipped toppings. Processed cheese and cheese spreads. Fats and oils Butter. Stick margarine. Lard. Shortening. Ghee. Bacon fat. Tropical oils, such as coconut, palm kernel, or palm oil. Seasonings and condiments Onion salt, garlic salt, seasoned salt, table salt, and sea salt. Worcestershire sauce. Tartar sauce. Barbecue sauce. Teriyaki sauce. Soy sauce, including reduced-sodium soy sauce. Steak sauce. Canned and packaged gravies. Fish sauce. Oyster sauce. Cocktail sauce. Store-bought  horseradish. Ketchup. Mustard. Meat flavorings and tenderizers. Bouillon cubes. Hot sauces. Pre-made or packaged marinades. Pre-made or packaged taco seasonings. Relishes. Regular salad dressings. Other foods Salted popcorn and pretzels. The items listed above may not be all the foods and drinks you should avoid. Talk to a dietitian to learn more. Where to find more information National Heart, Lung, and Blood Institute (NHLBI): [place]ry[employer].gl American Heart Association (AHA): heart.org Academy of Nutrition and Dietetics: eatright.org National Kidney Foundation (NKF): kidney.org This information is not intended to replace advice given to you by your health care provider. Make sure you discuss any questions you have with your health care provider. Document Revised: 02/20/2022 Document Reviewed: 02/20/2022 Elsevier Patient Education  2024 [employer].

## 2023-07-17 ENCOUNTER — Ambulatory Visit: Admitting: Nurse Practitioner

## 2023-07-24 ENCOUNTER — Other Ambulatory Visit: Payer: Self-pay

## 2023-07-24 DIAGNOSIS — N401 Enlarged prostate with lower urinary tract symptoms: Secondary | ICD-10-CM

## 2023-07-24 DIAGNOSIS — E291 Testicular hypofunction: Secondary | ICD-10-CM

## 2023-07-25 LAB — HEMOGLOBIN AND HEMATOCRIT, BLOOD
Hematocrit: 44.7 % (ref 37.5–51.0)
Hemoglobin: 14.7 g/dL (ref 13.0–17.7)

## 2023-07-25 LAB — PSA: Prostate Specific Ag, Serum: 1.4 ng/mL (ref 0.0–4.0)

## 2023-07-25 LAB — TESTOSTERONE: Testosterone: 358 ng/dL (ref 264–916)

## 2023-07-26 NOTE — Patient Instructions (Signed)
 Be Involved in Caring For Your Health:  Taking Medications When medications are taken as directed, they can greatly improve your health. But if they are not taken as prescribed, they may not work. In some cases, not taking them correctly can be harmful. To help ensure your treatment remains effective and safe, understand your medications and how to take them. Bring your medications to each visit for review by your provider.  Your lab results, notes, and after visit summary will be available on My Chart. We strongly encourage you to use this feature. If lab results are abnormal the clinic will contact you with the appropriate steps. If the clinic does not contact you assume the results are satisfactory. You can always view your results on My Chart. If you have questions regarding your health or results, please contact the clinic during office hours. You can also ask questions on My Chart.  We at [facility] are grateful that you chose [place] to provide your care. We strive to provide evidence-based and compassionate care and are always looking for feedback. If you get a survey from the clinic please complete this so we can hear your opinions.  DASH Eating Plan DASH stands for Dietary Approaches to Stop Hypertension. The DASH eating plan is a healthy eating plan that has been shown to: Lower high blood pressure (hypertension). Reduce your risk for type 2 diabetes, heart disease, and stroke. Help with weight loss. What are tips for following this plan? Reading food labels Check food labels for the amount of salt (sodium) per serving. Choose foods with less than 5 percent of the Daily Value (DV) of sodium. In general, foods with less than 300 milligrams (mg) of sodium per serving fit into this eating plan. To find whole grains, look for the word "whole" as the first word in the ingredient list. Shopping Buy products labeled as "low-sodium" or "no salt added." Buy fresh foods. Avoid canned  foods and pre-made or frozen meals. Cooking Try not to add salt when you cook. Use salt-free seasonings or herbs instead of table salt or sea salt. Check with your health care provider or pharmacist before using salt substitutes. Do not fry foods. Cook foods in healthy ways, such as baking, boiling, grilling, roasting, or broiling. Cook using oils that are good for your heart. These include olive, canola, avocado, soybean, and sunflower oil. Meal planning  Eat a balanced diet. This should include: 4 or more servings of fruits and 4 or more servings of vegetables each day. Try to fill half of your plate with fruits and vegetables. 6-8 servings of whole grains each day. 6 or less servings of lean meat, poultry, or fish each day. 1 oz is 1 serving. A 3 oz (85 g) serving of meat is about the same size as the palm of your hand. One egg is 1 oz (28 g). 2-3 servings of low-fat dairy each day. One serving is 1 cup (237 mL). 1 serving of nuts, seeds, or beans 5 times each week. 2-3 servings of heart-healthy fats. Healthy fats called omega-3 fatty acids are found in foods such as walnuts, flaxseeds, fortified milks, and eggs. These fats are also found in cold-water fish, such as sardines, salmon, and mackerel. Limit how much you eat of: Canned or prepackaged foods. Food that is high in trans fat, such as fried foods. Food that is high in saturated fat, such as fatty meat. Desserts and other sweets, sugary drinks, and other foods with added sugar. Full-fat  dairy products. Do not salt foods before eating. Do not eat more than 4 egg yolks a week. Try to eat at least 2 vegetarian meals a week. Eat more home-cooked food and less restaurant, buffet, and fast food. Lifestyle When eating at a restaurant, ask if your food can be made with less salt or no salt. If you drink alcohol: Limit how much you have to: 0-1 drink a day if you are male. 0-2 drinks a day if you are male. Know how much alcohol is in  your drink. In the [place] one drink is one 12 oz bottle of beer (355 mL), one 5 oz glass of wine (148 mL), or one 1 oz glass of hard liquor (44 mL). General information Avoid eating more than 2,300 mg of salt a day. If you have hypertension, you may need to reduce your sodium intake to 1,500 mg a day. Work with your provider to stay at a healthy body weight or lose weight. Ask what the best weight range is for you. On most days of the week, get at least 30 minutes of exercise that causes your heart to beat faster. This may include walking, swimming, or biking. Work with your provider or dietitian to adjust your eating plan to meet your specific calorie needs. What foods should I eat? Fruits All fresh, dried, or frozen fruit. Canned fruits that are in their natural juice and do not have sugar added to them. Vegetables Fresh or frozen vegetables that are raw, steamed, roasted, or grilled. Low-sodium or reduced-sodium tomato and vegetable juice. Low-sodium or reduced-sodium tomato sauce and tomato paste. Low-sodium or reduced-sodium canned vegetables. Grains Whole-grain or whole-wheat bread. Whole-grain or whole-wheat pasta. Brown rice. Orpah Cobb. Bulgur. Whole-grain and low-sodium cereals. Pita bread. Low-fat, low-sodium crackers. Whole-wheat flour tortillas. Meats and other proteins Skinless chicken or [place]. Ground chicken or [place]. Pork with fat trimmed off. Fish and seafood. Egg whites. Dried beans, peas, or lentils. Unsalted nuts, nut butters, and seeds. Unsalted canned beans. Lean cuts of beef with fat trimmed off. Low-sodium, lean precooked or cured meat, such as sausages or meat loaves. Dairy Low-fat (1%) or fat-free (skim) milk. Reduced-fat, low-fat, or fat-free cheeses. Nonfat, low-sodium ricotta or cottage cheese. Low-fat or nonfat yogurt. Low-fat, low-sodium cheese. Fats and oils Soft margarine without trans fats. Vegetable oil. Reduced-fat, low-fat, or light mayonnaise and salad  dressings (reduced-sodium). Canola, safflower, olive, avocado, soybean, and sunflower oils. Avocado. Seasonings and condiments Herbs. Spices. Seasoning mixes without salt. Other foods Unsalted popcorn and pretzels. Fat-free sweets. The items listed above may not be all the foods and drinks you can have. Talk to a dietitian to learn more. What foods should I avoid? Fruits Canned fruit in a light or heavy syrup. Fried fruit. Fruit in cream or butter sauce. Vegetables Creamed or fried vegetables. Vegetables in a cheese sauce. Regular canned vegetables that are not marked as low-sodium or reduced-sodium. Regular canned tomato sauce and paste that are not marked as low-sodium or reduced-sodium. Regular tomato and vegetable juices that are not marked as low-sodium or reduced-sodium. Rosita Fire. Olives. Grains Baked goods made with fat, such as croissants, muffins, or some breads. Dry pasta or rice meal packs. Meats and other proteins Fatty cuts of meat. Ribs. Fried meat. Tomasa Blase. Bologna, salami, and other precooked or cured meats, such as sausages or meat loaves, that are not lean and low in sodium. Fat from the back of a pig (fatback). Bratwurst. Salted nuts and seeds. Canned beans with added salt. Canned  or smoked fish. Whole eggs or egg yolks. Chicken or [place] with skin. Dairy Whole or 2% milk, cream, and half-and-half. Whole or full-fat cream cheese. Whole-fat or sweetened yogurt. Full-fat cheese. Nondairy creamers. Whipped toppings. Processed cheese and cheese spreads. Fats and oils Butter. Stick margarine. Lard. Shortening. Ghee. Bacon fat. Tropical oils, such as coconut, palm kernel, or palm oil. Seasonings and condiments Onion salt, garlic salt, seasoned salt, table salt, and sea salt. Worcestershire sauce. Tartar sauce. Barbecue sauce. Teriyaki sauce. Soy sauce, including reduced-sodium soy sauce. Steak sauce. Canned and packaged gravies. Fish sauce. Oyster sauce. Cocktail sauce. Store-bought  horseradish. Ketchup. Mustard. Meat flavorings and tenderizers. Bouillon cubes. Hot sauces. Pre-made or packaged marinades. Pre-made or packaged taco seasonings. Relishes. Regular salad dressings. Other foods Salted popcorn and pretzels. The items listed above may not be all the foods and drinks you should avoid. Talk to a dietitian to learn more. Where to find more information National Heart, Lung, and Blood Institute (NHLBI): [place]ry[employer]er.gl American Heart Association (AHA): heart.org Academy of Nutrition and Dietetics: eatright.org National Kidney Foundation (NKF): kidney.org This information is not intended to replace advice given to you by your health care provider. Make sure you discuss any questions you have with your health care provider. Document Revised: 02/20/2022 Document Reviewed: 02/20/2022 Elsevier Patient Education  2024 [employer].

## 2023-07-28 NOTE — Progress Notes (Unsigned)
 07/30/23 8:34 AM   Joe Gutierrez [birth year] [id number]  Referring provider:  Lemar Pyles, NP [address] [place],  [place] [address]  Urological history  1. Hypogonadism -contributing factors of age, obesity and sleep apnea -testosterone  level (07/2023) 358 -hemoglobin/hematocrit (07/2023) 14.7/44.7 -testosterone  cypionate 200 mg/mL, 1 cc every 14 days   2.  Erectile dysfunction -contributing factors of age, sleep apnea, HTN, BPH, HLD and testosterone  deficiency  3. Prostate cancer screening -PSA (07/2023) 1.4  4. BPH w/ LU TS -Contributing factors of age, hypertension, sleep apnea, depression, anxiety and obesity  5. Hydroceles -scrotal US  (01/2022) small bilateral hydroceles  6. Varicoceles -scrotal US  (01/2022) small bilateral varicoceles   Chief Complaint  Patient presents with   Follow-up    HPI: Joe Gutierrez is a 48 y.o. man who presents today for follow-up.  Previous records reviewed.   I PSS 5/1  He has no urinary complaints.  Patient denies any modifying or aggravating factors.  Patient denies any recent UTI's, gross hematuria, dysuria or suprapubic/flank pain.  Patient denies any fevers, chills, nausea or vomiting.     IPSS     Row Name 07/30/23 0800         International Prostate Symptom Score   How often have you had the sensation of not emptying your bladder? Less than 1 in 5     How often have you had to urinate less than every two hours? Less than 1 in 5 times     How often have you found you stopped and started again several times when you urinated? Less than 1 in 5 times     How often have you found it difficult to postpone urination? Not at All     How often have you had a weak urinary stream? Not at All     How often have you had to strain to start urination? Not at All     How many times did you typically get up at night to urinate? 2 Times     Total IPSS Score 5       Quality of Life due to urinary symptoms   If you were to  spend the rest of your life with your urinary condition just the way it is now how would you feel about that? Pleased       Score:  1-7 Mild 8-19 Moderate 20-35 Severe  SHIM 20  Patient still having spontaneous erections.  He denies any pain or curvature with erections.   He still has issues with ejaculatory disorder, but they are improved as he stops his Zoloft  on the weekend and it resolves.  He denies any hematospermia or painful ejaculation.   SHIM     Row Name 07/30/23 0813         SHIM: Over the last 6 months:   How do you rate your confidence that you could get and keep an erection? High     When you had erections with sexual stimulation, how often were your erections hard enough for penetration (entering your partner)? Most Times (much more than half the time)     During sexual intercourse, how often were you able to maintain your erection after you had penetrated (entered) your partner? Most Times (much more than half the time)     During sexual intercourse, how difficult was it to maintain your erection to completion of intercourse? Slightly Difficult     When you attempted sexual intercourse, how often  was it satisfactory for you? Most Times (much more than half the time)       SHIM Total Score   SHIM 20         Score: 1-7 Severe ED 8-11 Moderate ED 12-16 Mild-Moderate ED 17-21 Mild ED 22-25 No ED   PMH: Past Medical History:  Diagnosis Date   Anxiety    Arthritis    Colon polyp    Depression    Heart murmur    History of kidney stones    Hypertension    Hypogonadism in male    Obesity    Sleep apnea     Surgical History: Past Surgical History:  Procedure Laterality Date   APPENDECTOMY     CLOSED REDUCTION NASAL FRACTURE N/A 06/11/2021   Procedure: CLOSED REDUCTION NASAL FRACTURE;  Surgeon: Barb Bonito, MD;  Location: [facility] OR;  Service: Plastics;  Laterality: N/A;   COLONOSCOPY N/A 06/17/2022   Procedure: COLONOSCOPY;  Surgeon: Luke Salaam, MD;   Location: [facility] ENDOSCOPY;  Service: Gastroenterology;  Laterality: N/A;   COLONOSCOPY WITH PROPOFOL  N/A 11/16/2020   Procedure: COLONOSCOPY WITH PROPOFOL ;  Surgeon: Luke Salaam, MD;  Location: [facility] ENDOSCOPY;  Service: Gastroenterology;  Laterality: N/A;   COLONOSCOPY WITH PROPOFOL  N/A 07/21/2022   Procedure: COLONOSCOPY WITH PROPOFOL ;  Surgeon: Luke Salaam, MD;  Location: [facility] ENDOSCOPY;  Service: Gastroenterology;  Laterality: N/A;   ESOPHAGOGASTRODUODENOSCOPY (EGD) WITH PROPOFOL  N/A 06/17/2022   Procedure: ESOPHAGOGASTRODUODENOSCOPY (EGD) WITH PROPOFOL ;  Surgeon: Luke Salaam, MD;  Location: [facility] ENDOSCOPY;  Service: Gastroenterology;  Laterality: N/A;   LAPAROSCOPIC RIGHT COLECTOMY N/A 01/24/2021   Procedure: LAPAROSCOPIC RIGHT COLECTOMY with Gabriela Johns, PA-C assisting;  Surgeon: Alben Alma, MD;  Location: [facility] ORS;  Service: General;  Laterality: N/A;   LITHOTRIPSY      Home Medications:  Allergies as of 07/30/2023       Reactions   Codeine Hives   Sulfa Antibiotics Hives        Medication List        Accurate as of July 30, 2023  8:34 AM. If you have any questions, ask your nurse or doctor.          2-3CC SYRINGE 3 ML Misc 1 mg by Does not apply route every 14 (fourteen) days.   amLODipine  5 MG tablet Commonly known as: NORVASC  Take 1 tablet (5 mg total) by mouth daily.   BD Disp Needles 18G X 1-1/2 Misc Generic drug: NEEDLE (DISP) 18 G 1 mg by Does not apply route every 14 (fourteen) days.   BD Disp Needles 21G X 1-1/2 Misc Generic drug: NEEDLE (DISP) 21 G 1 mg by Does not apply route every 14 (fourteen) days.   buPROPion  150 MG 24 hr tablet Commonly known as: WELLBUTRIN  XL TAKE 1 TABLET BY MOUTH EVERY DAY   cyclobenzaprine  10 MG tablet Commonly known as: FLEXERIL  Take 1 tablet (10 mg total) by mouth 3 (three) times daily as needed for muscle spasms.   lisinopril  40 MG tablet Commonly known as: ZESTRIL  TAKE 1 TABLET BY MOUTH EVERY DAY   sertraline  100  MG tablet Commonly known as: ZOLOFT  Take 2 tablets (200 mg total) by mouth daily.   testosterone  cypionate 200 MG/ML injection Commonly known as: DEPOTESTOSTERONE CYPIONATE Inject 1 mL (200 mg total) into the muscle every 14 (fourteen) days.   Vitamin D  (Cholecalciferol) 25 MCG (1000 UT) Tabs Take 1,000 Units by mouth daily.        Allergies:  Allergies  Allergen Reactions  Codeine Hives   Sulfa Antibiotics Hives    Family History: Family History  Problem Relation Age of Onset   Diabetes Mother    Hypertension Mother    Alcohol abuse Father    Heart disease Father    Hypertension Father    Diabetes Maternal Grandmother    Stroke Maternal Grandfather    Hypertension Brother    Asthma Daughter    Heart disease Paternal Grandfather     Social History:  reports that he has never smoked. He has never used smokeless tobacco. He reports that he does not drink alcohol and does not use drugs.   Physical Exam: BP 115/85   Pulse 87   Constitutional:  Well nourished. Alert and oriented, No acute distress. HEENT: [place] AT, moist mucus membranes.  Trachea midline Cardiovascular: No clubbing, cyanosis, or edema. Respiratory: Normal respiratory effort, no increased work of breathing. Neurologic: Grossly intact, no focal deficits, moving all 4 extremities. Psychiatric: Normal mood and affect.   Laboratory Data: Results for orders placed or performed in visit on 07/24/23  Testosterone    Collection Time: 07/24/23  8:18 AM  Result Value Ref Range   Testosterone  358 264 - 916 ng/dL  PSA   Collection Time: 07/24/23  8:18 AM  Result Value Ref Range   Prostate Specific Ag, Serum 1.4 0.0 - 4.0 ng/mL  Hemoglobin and hematocrit, blood   Collection Time: 07/24/23  8:18 AM  Result Value Ref Range   Hemoglobin 14.7 13.0 - 17.7 g/dL   Hematocrit 16.1 09.6 - 51.0 %   Lipid Panel     Component Value Date/Time   CHOL 197 06/15/2023 0859   TRIG 175 (H) 06/15/2023 0859   HDL 36 (L)  06/15/2023 0859   LDLCALC 130 (H) 06/15/2023 0859   LABVLDL 31 06/15/2023 0859    CMP     Component Value Date/Time   NA 139 06/15/2023 0859   NA 140 08/09/2012 0814   K 4.3 06/15/2023 0859   K 4.3 08/09/2012 0814   CL 103 06/15/2023 0859   CL 107 08/09/2012 0814   CO2 24 06/15/2023 0859   CO2 28 08/09/2012 0814   GLUCOSE 92 06/15/2023 0859   GLUCOSE 174 (H) 01/25/2021 0219   GLUCOSE 104 (H) 08/09/2012 0814   BUN 10 06/15/2023 0859   BUN 13 08/09/2012 0814   CREATININE 0.92 06/15/2023 0859   CREATININE 0.96 08/09/2012 0814   CALCIUM 9.0 06/15/2023 0859   CALCIUM 8.5 08/09/2012 0814   PROT 6.6 06/15/2023 0859   PROT 7.0 08/09/2012 0814   ALBUMIN  4.4 06/15/2023 0859   ALBUMIN  3.8 08/09/2012 0814   AST 20 06/15/2023 0859   AST 24 08/09/2012 0814   ALT 22 06/15/2023 0859   ALT 22 08/09/2012 0814   ALKPHOS 84 06/15/2023 0859   ALKPHOS 79 08/09/2012 0814   BILITOT 0.3 06/15/2023 0859   BILITOT 0.5 08/09/2012 0814   EGFR 103 06/15/2023 0859   GFRNONAA >60 01/25/2021 0219   GFRNONAA >60 08/09/2012 0814    CBC    Component Value Date/Time   WBC 7.2 06/15/2023 0859   WBC 11.0 (H) 01/26/2021 0642   RBC 5.26 06/15/2023 0859   RBC 4.08 (L) 01/26/2021 0642   HGB 14.7 07/24/2023 0818   HCT 44.7 07/24/2023 0818   PLT 230 06/15/2023 0859   MCV 89 06/15/2023 0859   MCV 86 08/09/2012 0814   MCH 28.9 06/15/2023 0859   MCH 29.4 01/26/2021 0642   MCHC 32.6 06/15/2023 0859  MCHC 33.2 01/26/2021 0642   RDW 12.7 06/15/2023 0859   RDW 12.8 08/09/2012 0814   LYMPHSABS 1.9 06/15/2023 0859   EOSABS 0.2 06/15/2023 0859   BASOSABS 0.0 06/15/2023 0859     Component     Latest Ref Rng 06/15/2023  HB A1C (BAYER DCA - WAIVED)     4.8 - 5.6 % 5.4     Component     Latest Ref Rng 06/15/2023  TSH     0.450 - 4.500 uIU/mL 1.620    I have reviewed the labs.   Pertinent Imaging N/A  Assessment & Plan:    1. Hypogonadism  - Testosterone  levels normal  - hemoglobin/hematocrit  normal - continue testosterone  cypionate 200 mg/mL, 1 cc every 14 days  2.  Erectile dysfunction - Having satisfactory intercourse  3. BPH with LU TS  - PSA normal  4.  Ejaculatory disorder -Resolved with TRT and adjustment of his sertraline   Return in about 3 months (around 10/30/2023) for testosterone , hemoglobin and hematocrit only .  And in 6 months for PSA, testosterone , hemoglobin, hematocrit, IPSS, SH IM and exam  Matilde Son, PA-C   [facility] [address], [address] [place], [place] [address] [phone]

## 2023-07-30 ENCOUNTER — Ambulatory Visit: Attending: Nurse Practitioner

## 2023-07-30 ENCOUNTER — Encounter: Payer: Self-pay | Admitting: Urology

## 2023-07-30 ENCOUNTER — Ambulatory Visit (INDEPENDENT_AMBULATORY_CARE_PROVIDER_SITE_OTHER): Payer: Self-pay | Admitting: Urology

## 2023-07-30 ENCOUNTER — Ambulatory Visit (INDEPENDENT_AMBULATORY_CARE_PROVIDER_SITE_OTHER): Admitting: Nurse Practitioner

## 2023-07-30 ENCOUNTER — Encounter: Payer: Self-pay | Admitting: Nurse Practitioner

## 2023-07-30 VITALS — BP 115/85 | HR 87

## 2023-07-30 VITALS — BP 116/75 | HR 65 | Temp 98.2°F | Ht 70.5 in | Wt 265.8 lb

## 2023-07-30 DIAGNOSIS — Z8249 Family history of ischemic heart disease and other diseases of the circulatory system: Secondary | ICD-10-CM

## 2023-07-30 DIAGNOSIS — R002 Palpitations: Secondary | ICD-10-CM | POA: Insufficient documentation

## 2023-07-30 DIAGNOSIS — E291 Testicular hypofunction: Secondary | ICD-10-CM

## 2023-07-30 DIAGNOSIS — N401 Enlarged prostate with lower urinary tract symptoms: Secondary | ICD-10-CM

## 2023-07-30 DIAGNOSIS — N138 Other obstructive and reflux uropathy: Secondary | ICD-10-CM

## 2023-07-30 DIAGNOSIS — N5319 Other ejaculatory dysfunction: Secondary | ICD-10-CM | POA: Diagnosis not present

## 2023-07-30 DIAGNOSIS — N529 Male erectile dysfunction, unspecified: Secondary | ICD-10-CM

## 2023-07-30 DIAGNOSIS — I10 Essential (primary) hypertension: Secondary | ICD-10-CM | POA: Diagnosis not present

## 2023-07-30 DIAGNOSIS — E349 Endocrine disorder, unspecified: Secondary | ICD-10-CM

## 2023-07-30 MED ORDER — TESTOSTERONE CYPIONATE 200 MG/ML IM SOLN
200.0000 mg | INTRAMUSCULAR | 0 refills | Status: AC
Start: 2023-07-30 — End: ?

## 2023-07-30 MED ORDER — AMLODIPINE BESYLATE 5 MG PO TABS: 5.0000 mg | ORAL_TABLET | Freq: Every day | ORAL | 4 refills | Status: AC

## 2023-07-30 NOTE — Assessment & Plan Note (Signed)
 Father had bypass surgery at 69.  He was smoker and drank alcohol regularly.  Patient concerned about his heart health.  Referral to cardiology last visit and he is scheduled upcoming.  EKG last visit was reassuring and similar to previous EKGs.

## 2023-07-30 NOTE — Progress Notes (Addendum)
 BP 116/75   Pulse 65   Temp 98.2 F (36.8 C) (Oral)   Ht 5' 10.5 (1.791 m)   Wt 265 lb 12.8 oz (120.6 kg)   SpO2 97%   BMI 37.60 kg/m    Subjective:    Patient ID: Joe Gutierrez, male    DOB: October 20, 1975, 48 y.o.   MRN: [id number]  HPI: Joe Gutierrez is a 48 y.o. male  Chief Complaint  Patient presents with   Hypertension   HYPERTENSION without Chronic Kidney Disease Follow-up today for addition of Amlodipine  on 06/15/23 due to BP elevations + discussion of symptoms present at last visit. Continues to take Amlodipine  5 MG and Lisinopril  40 MG daily. Tolerating the Amlodipine .  Continues to have some mild CP and SOB, scheduled to see cardiology July 15th.   Hypertension status: stable  Satisfied with current treatment? yes Duration of hypertension: chronic BP monitoring frequency:  a few times a week BP range: checking in evening and can be a bit higher BP medication side effects:  no Medication compliance: good compliance Aspirin: no Recurrent headaches: no Visual changes: no Palpitations: at times beats faster Dyspnea: occasional Chest pain: yes when heart beats faster Lower extremity edema: no Dizzy/lightheaded: occasional   Relevant past medical, surgical, family and social history reviewed and updated as indicated. Interim medical history since our last visit reviewed. Allergies and medications reviewed and updated.  Review of Systems  Constitutional:  Negative for activity change, appetite change, diaphoresis, fatigue, fever and unexpected weight change.  Respiratory:  Positive for shortness of breath. Negative for cough, chest tightness and wheezing.   Cardiovascular:  Positive for chest pain and palpitations. Negative for leg swelling.  Gastrointestinal: Negative.   Neurological: Negative.   Psychiatric/Behavioral: Negative.      Per HPI unless specifically indicated above     Objective:    BP 116/75   Pulse 65   Temp 98.2 F (36.8 C) (Oral)    Ht 5' 10.5 (1.791 m)   Wt 265 lb 12.8 oz (120.6 kg)   SpO2 97%   BMI 37.60 kg/m   Wt Readings from Last 3 Encounters:  07/30/23 265 lb 12.8 oz (120.6 kg)  06/15/23 266 lb (120.7 kg)  01/30/23 250 lb (113.4 kg)    Physical Exam Vitals and nursing note reviewed.  Constitutional:      General: He is awake. He is not in acute distress.    Appearance: He is well-developed and well-groomed. He is obese. He is not ill-appearing or toxic-appearing.  HENT:     Head: Normocephalic.     Right Ear: Hearing and external ear normal.     Left Ear: Hearing and external ear normal.   Eyes:     General: Lids are normal.     Extraocular Movements: Extraocular movements intact.     Conjunctiva/sclera: Conjunctivae normal.   Neck:     Thyroid: No thyromegaly.     Vascular: No carotid bruit.   Cardiovascular:     Rate and Rhythm: Normal rate and regular rhythm.     Heart sounds: Normal heart sounds. No murmur heard.    No gallop.  Pulmonary:     Effort: No accessory muscle usage or respiratory distress.     Breath sounds: Normal breath sounds.  Abdominal:     General: Bowel sounds are normal. There is no distension.     Palpations: Abdomen is soft.     Tenderness: There is no abdominal tenderness.   Musculoskeletal:  Cervical back: Full passive range of motion without pain.     Right lower leg: No edema.     Left lower leg: No edema.  Lymphadenopathy:     Cervical: No cervical adenopathy.   Skin:    General: Skin is warm.     Capillary Refill: Capillary refill takes less than 2 seconds.   Neurological:     Mental Status: He is alert and oriented to person, place, and time.     Deep Tendon Reflexes: Reflexes are normal and symmetric.     Reflex Scores:      Brachioradialis reflexes are 2+ on the right side and 2+ on the left side.      Patellar reflexes are 2+ on the right side and 2+ on the left side.  Psychiatric:        Attention and Perception: Attention normal.         Mood and Affect: Mood normal.        Speech: Speech normal.        Behavior: Behavior normal. Behavior is cooperative.        Thought Content: Thought content normal.    Results for orders placed or performed in visit on 07/24/23  Testosterone    Collection Time: 07/24/23  8:18 AM  Result Value Ref Range   Testosterone  358 264 - 916 ng/dL  PSA   Collection Time: 07/24/23  8:18 AM  Result Value Ref Range   Prostate Specific Ag, Serum 1.4 0.0 - 4.0 ng/mL  Hemoglobin and hematocrit, blood   Collection Time: 07/24/23  8:18 AM  Result Value Ref Range   Hemoglobin 14.7 13.0 - 17.7 g/dL   Hematocrit 16.1 09.6 - 51.0 %      Assessment & Plan:   Problem List Items Addressed This Visit       Cardiovascular and Mediastinum   Essential hypertension, benign - Primary   Chronic, stable.  BP much improved today and at goal.  Continue Lisinopril  40 MG and Amlodipine  5 MG daily.  Educated him on medications and side effects to report.  Recommend he continue to monitor BP at home and document + focus on DASH diet.  To notify provider if any dry cough presents.  LABS: up to date.      Relevant Medications   amLODipine  (NORVASC ) 5 MG tablet     Other   Palpitations   Ongoing with history of father having bypass surgery at age 19.  With elevated heart rate episodes he reports getting CP and SOB + dizziness.  Recent EKG overall reassuring and similar to past EKG.  Scheduled to see cardiology upcoming.  Recommend he start a Baby ASA 81 MG daily until full cardiology work-up.  Will order Zio 7 day monitor to further assess palpitations.  Strict ER precautions discussed and he is aware when to go there for assessment. - May benefit statin therapy.  Will review cardiology recommendations after visit.      Relevant Orders   LONG TERM MONITOR (3-14 DAYS)   Family history of heart disease   Father had bypass surgery at 52.  He was smoker and drank alcohol regularly.  Patient concerned about his heart  health.  Referral to cardiology last visit and he is scheduled upcoming.  EKG last visit was reassuring and similar to previous EKGs.        Follow up plan: Return in about 3 months (around 11/03/2023) for CHEST PAIN AND HTN.

## 2023-07-30 NOTE — Assessment & Plan Note (Signed)
 Chronic, stable.  BP much improved today and at goal.  Continue Lisinopril  40 MG and Amlodipine  5 MG daily.  Educated him on medications and side effects to report.  Recommend he continue to monitor BP at home and document + focus on DASH diet.  To notify provider if any dry cough presents.  LABS: up to date.

## 2023-07-30 NOTE — Assessment & Plan Note (Addendum)
 Ongoing with history of father having bypass surgery at age 48.  With elevated heart rate episodes he reports getting CP and SOB + dizziness.  Recent EKG overall reassuring and similar to past EKG.  Scheduled to see cardiology upcoming.  Recommend he start a Baby ASA 81 MG daily until full cardiology work-up.  Will order Zio 7 day monitor to further assess palpitations.  Strict ER precautions discussed and he is aware when to go there for assessment. - May benefit statin therapy.  Will review cardiology recommendations after visit.

## 2023-07-30 NOTE — Addendum Note (Signed)
 Addended byKatina Parlor on: 07/30/2023 08:47 AM   Modules accepted: Orders

## 2023-09-01 ENCOUNTER — Encounter: Payer: Self-pay | Admitting: Cardiology

## 2023-09-01 ENCOUNTER — Ambulatory Visit: Attending: Cardiology | Admitting: Cardiology

## 2023-09-01 ENCOUNTER — Ambulatory Visit: Payer: Self-pay | Admitting: Nurse Practitioner

## 2023-09-01 VITALS — BP 110/88 | HR 69 | Ht 70.0 in | Wt 269.5 lb

## 2023-09-01 DIAGNOSIS — E782 Mixed hyperlipidemia: Secondary | ICD-10-CM

## 2023-09-01 DIAGNOSIS — I10 Essential (primary) hypertension: Secondary | ICD-10-CM

## 2023-09-01 DIAGNOSIS — R002 Palpitations: Secondary | ICD-10-CM | POA: Diagnosis not present

## 2023-09-01 DIAGNOSIS — R072 Precordial pain: Secondary | ICD-10-CM

## 2023-09-01 MED ORDER — METOPROLOL TARTRATE 100 MG PO TABS: ORAL_TABLET | ORAL | 0 refills | Status: DC

## 2023-09-01 NOTE — Progress Notes (Signed)
 Cardiology Office Note:    Date:  09/01/2023   ID:  Joe Gutierrez, DOB 1975/04/24, MRN [id number]  PCP:  Valerio Melanie DASEN, NP   [facility] Providers Cardiologist:  None     Referring MD: Valerio Melanie DASEN, NP   Chief Complaint  Patient presents with   New Patient (Initial Visit)    HTN/ Elevated LDL and Fam. Hx CAD c/o sob and chest pain with exertion/Discuss zio results. Meds reviewed verbally with pt.   Joe Gutierrez is a 48 y.o. male who is being seen today for the evaluation of chest pain at the request of Valerio Melanie DASEN, NP.   History of Present Illness:    Joe Gutierrez is a 48 y.o. male with a hx of hypertension, hypogonadism presenting due to palpitations, chest pain and family risk.  States having episodes of chest pain over the past 3 months or so usually with exertion such as going up stairs.  Chest discomfort is also associated with shortness of breath.  Sometimes has palpitations.  Followed up with primary care provider and cardiac monitor was placed.  Father ha had a heart attack in his 36s, underwent coronary artery bypass.  Cardiac monitor was placed 07/30/2023, average heart rate 71, minimal heart rate 45, max heart rate 145.  No A-fib or flutter, no significant arrhythmias.  Past Medical History:  Diagnosis Date   Anxiety    Arthritis    Colon polyp    Depression    Heart murmur    History of kidney stones    Hypertension    Hypogonadism in male    Obesity    Sleep apnea     Past Surgical History:  Procedure Laterality Date   APPENDECTOMY     CLOSED REDUCTION NASAL FRACTURE N/A 06/11/2021   Procedure: CLOSED REDUCTION NASAL FRACTURE;  Surgeon: Elisabeth Craig GORMAN, MD;  Location: [facility] OR;  Service: Plastics;  Laterality: N/A;   COLONOSCOPY N/A 06/17/2022   Procedure: COLONOSCOPY;  Surgeon: Therisa Bi, MD;  Location: [facility] ENDOSCOPY;  Service: Gastroenterology;  Laterality: N/A;   COLONOSCOPY WITH PROPOFOL  N/A 11/16/2020   Procedure:  COLONOSCOPY WITH PROPOFOL ;  Surgeon: Therisa Bi, MD;  Location: [facility] ENDOSCOPY;  Service: Gastroenterology;  Laterality: N/A;   COLONOSCOPY WITH PROPOFOL  N/A 07/21/2022   Procedure: COLONOSCOPY WITH PROPOFOL ;  Surgeon: Therisa Bi, MD;  Location: [facility] ENDOSCOPY;  Service: Gastroenterology;  Laterality: N/A;   ESOPHAGOGASTRODUODENOSCOPY (EGD) WITH PROPOFOL  N/A 06/17/2022   Procedure: ESOPHAGOGASTRODUODENOSCOPY (EGD) WITH PROPOFOL ;  Surgeon: Therisa Bi, MD;  Location: [facility] ENDOSCOPY;  Service: Gastroenterology;  Laterality: N/A;   LAPAROSCOPIC RIGHT COLECTOMY N/A 01/24/2021   Procedure: LAPAROSCOPIC RIGHT COLECTOMY with Darlyn Platt, PA-C assisting;  Surgeon: Jordis Laneta FALCON, MD;  Location: [facility] ORS;  Service: General;  Laterality: N/A;   LITHOTRIPSY      Current Medications: Current Meds  Medication Sig   amLODipine  (NORVASC ) 5 MG tablet Take 1 tablet (5 mg total) by mouth daily.   buPROPion  (WELLBUTRIN  XL) 150 MG 24 hr tablet TAKE 1 TABLET BY MOUTH EVERY DAY   cyclobenzaprine  (FLEXERIL ) 10 MG tablet Take 1 tablet (10 mg total) by mouth 3 (three) times daily as needed for muscle spasms.   lisinopril  (ZESTRIL ) 40 MG tablet TAKE 1 TABLET BY MOUTH EVERY DAY   metoprolol  tartrate (LOPRESSOR ) 100 MG tablet TAKE 1 TABLET 2 HR PRIOR TO CARDIAC PROCEDURE   NEEDLE, DISP, 18 G (BD DISP NEEDLES) 18G X 1-1/2 MISC 1 mg by Does not  apply route every 14 (fourteen) days.   NEEDLE, DISP, 21 G (BD DISP NEEDLES) 21G X 1-1/2 MISC 1 mg by Does not apply route every 14 (fourteen) days.   sertraline  (ZOLOFT ) 100 MG tablet Take 2 tablets (200 mg total) by mouth daily.   Syringe, Disposable, (2-3CC SYRINGE) 3 ML MISC 1 mg by Does not apply route every 14 (fourteen) days.   testosterone  cypionate (DEPOTESTOSTERONE CYPIONATE) 200 MG/ML injection Inject 1 mL (200 mg total) into the muscle every 14 (fourteen) days.   Vitamin D , Cholecalciferol, 25 MCG (1000 UT) TABS Take 1,000 Units by mouth daily.     Allergies:    Codeine and Sulfa antibiotics   Social History   Socioeconomic History   Marital status: Married    Spouse name: Tabatha   Number of children: 3   Years of education: Not on file   Highest education level: Not on file  Occupational History   Not on file  Tobacco Use   Smoking status: Never   Smokeless tobacco: Never  Vaping Use   Vaping status: Never Used  Substance and Sexual Activity   Alcohol use: No   Drug use: No   Sexual activity: Yes  Other Topics Concern   Not on file  Social History Narrative   Not on file   Social Drivers of Health   Financial Resource Strain: Low Risk  (06/15/2023)   Overall Financial Resource Strain (CARDIA)    Difficulty of Paying Living Expenses: Not hard at all  Food Insecurity: No Food Insecurity (06/15/2023)   Hunger Vital Sign    Worried About Running Out of Food in the Last Year: Never true    Ran Out of Food in the Last Year: Never true  Transportation Needs: No Transportation Needs (06/15/2023)   PRAPARE - Administrator, Civil Service (Medical): No    Lack of Transportation (Non-Medical): No  Physical Activity: Inactive (06/15/2023)   Exercise Vital Sign    Days of Exercise per Week: 0 days    Minutes of Exercise per Session: 0 min  Stress: No Stress Concern Present (06/15/2023)   [employer] of Occupational Health - Occupational Stress Questionnaire    Feeling of Stress : Not at all  Social Connections: Moderately Integrated (06/15/2023)   Social Connection and Isolation Panel    Frequency of Communication with Friends and Family: More than three times a week    Frequency of Social Gatherings with Friends and Family: More than three times a week    Attends Religious Services: More than 4 times per year    Active Member of [employer] or Organizations: No    Attends Engineer, structural: Never    Marital Status: Married     Family History: The patient's family history includes Alcohol abuse in his father;  Asthma in his daughter; Diabetes in his maternal grandmother and mother; Heart attack in his father; Heart disease in his father and paternal grandfather; Hypertension in his brother, father, and mother; Stroke in his maternal grandfather.  ROS:   Please see the history of present illness.     All other systems reviewed and are negative.  EKGs/Labs/Other Studies Reviewed:    The following studies were reviewed today:  EKG Interpretation Date/Time:  Tuesday September 01 2023 09:26:32 EDT Ventricular Rate:  69 PR Interval:  170 QRS Duration:  106 QT Interval:  378 QTC Calculation: 405 R Axis:   -66  Text Interpretation: Normal sinus rhythm Left anterior fascicular  block Confirmed by Darliss Rogue ([id number]) on 09/01/2023 9:27:31 AM    Recent Labs: 06/15/2023: ALT 22; BUN 10; Creatinine, Ser 0.92; Platelets 230; Potassium 4.3; Sodium 139; TSH 1.620 07/24/2023: Hemoglobin 14.7  Recent Lipid Panel    Component Value Date/Time   CHOL 197 06/15/2023 0859   TRIG 175 (H) 06/15/2023 0859   HDL 36 (L) 06/15/2023 0859   LDLCALC 130 (H) 06/15/2023 0859     Risk Assessment/Calculations:             Physical Exam:    VS:  BP 110/88 (BP Location: Left Arm, Patient Position: Sitting, Cuff Size: Large)   Pulse 69   Ht 5' 10 (1.778 m)   Wt 269 lb 8 oz (122.2 kg)   SpO2 98%   BMI 38.67 kg/m     Wt Readings from Last 3 Encounters:  09/01/23 269 lb 8 oz (122.2 kg)  07/30/23 265 lb 12.8 oz (120.6 kg)  06/15/23 266 lb (120.7 kg)     GEN:  Well nourished, well developed in no acute distress HEENT: Normal NECK: No JVD; No carotid bruits CARDIAC: RRR, no murmurs, rubs, gallops RESPIRATORY:  Clear to auscultation without rales, wheezing or rhonchi  ABDOMEN: Soft, non-tender, non-distended MUSCULOSKELETAL:  No edema; No deformity  SKIN: Warm and dry NEUROLOGIC:  Alert and oriented x 3 PSYCHIATRIC:  Normal affect   ASSESSMENT:    1. Precordial pain   2. Primary hypertension   3.  Mixed hyperlipidemia    PLAN:    In order of problems listed above:  Chest pain, several risk factors.  Symptoms consistent with angina.  Get echocardiogram, obtain coronary CTA to rule out CAD. Hypertension, BP controlled.  Continue Norvasc  5 mg daily, lisinopril  40 mg daily. Hyperlipidemia, 10-year ASCVD risk 3.6%.  Not in statin benefit group at this point.  CCTA as above.  Low threshold to start statin if CAD noted.  Follow-up after cardiac testing     Medication Adjustments/Labs and Tests Ordered: Current medicines are reviewed at length with the patient today.  Concerns regarding medicines are outlined above.  Orders Placed This Encounter  Procedures   CT CORONARY MORPH W/CTA COR W/SCORE W/CA W/CM &/OR WO/CM   Basic metabolic panel with GFR   EKG 87-Ozji   ECHOCARDIOGRAM COMPLETE   Meds ordered this encounter  Medications   metoprolol  tartrate (LOPRESSOR ) 100 MG tablet    Sig: TAKE 1 TABLET 2 HR PRIOR TO CARDIAC PROCEDURE    Dispense:  1 tablet    Refill:  0    Patient Instructions  Medication Instructions:  - take ONE 100 mg metoprolol  two hours prior to cardiac CT *If you need a refill on your cardiac medications before your next appointment, please call your pharmacy*  Lab Work: Your provider would like for you to have following labs drawn today BMP.    If you have labs (blood work) drawn today and your tests are completely normal, you will receive your results only by: MyChart Message (if you have MyChart) OR A paper copy in the mail If you have any lab test that is abnormal or we need to change your treatment, we will call you to review the results.  Testing/Procedures: Your physician has requested that you have an echocardiogram. Echocardiography is a painless test that uses sound waves to create images of your heart. It provides your doctor with information about the size and shape of your heart and how well your heart's chambers and valves are working.  You may receive an ultrasound enhancing agent through an IV if needed to better visualize your heart during the echo. This procedure takes approximately one hour.  There are no restrictions for this procedure.  This will take place at [address] [facility] [address]) [address], [place] [address]  Please note: We ask at that you not bring children with you during ultrasound (echo/ vascular) testing. Due to room size and safety concerns, children are not allowed in the ultrasound rooms during exams. Our front office staff cannot provide observation of children in our lobby area while testing is being conducted. An adult accompanying a patient to their appointment will only be allowed in the ultrasound room at the discretion of the ultrasound technician under special circumstances. We apologize for any inconvenience.     Your cardiac CT will be scheduled at:  [facility] [address] [place], [place] [address] [phone]  Please arrive 15 mins early for check-in and test prep.  There is spacious parking and easy access to the radiology department from the [facility] Heart and Vascular entrance. Please enter here and check-in with the desk attendant.    Please follow these instructions carefully (unless otherwise directed):  An IV will be required for this test and Nitroglycerin will be given.  Hold all erectile dysfunction medications at least 3 days (72 hrs) prior to test. (Ie viagra , cialis, sildenafil , tadalafil, etc)     On the Night Before the Test: Be sure to Drink plenty of water. Do not consume any caffeinated/decaffeinated beverages or chocolate 12 hours prior to your test. Do not take any antihistamines 12 hours prior to your test.  On the Day of the Test: Drink plenty of water until 1 hour prior to the test. Do not eat any food 1 hour prior to test. You may take your regular medications prior to the test.  Take metoprolol  (Lopressor ) two  hours prior to test. If you take Furosemide/Hydrochlorothiazide/Spironolactone/Chlorthalidone, please HOLD on the morning of the test. Patients who wear a continuous glucose monitor MUST remove the device prior to scanning. FEMALES- please wear underwire-free bra if available, avoid dresses & tight clothing       After the Test: Drink plenty of water. After receiving IV contrast, you may experience a mild flushed feeling. This is normal. On occasion, you may experience a mild rash up to 24 hours after the test. This is not dangerous. If this occurs, you can take Benadryl 25 mg, Zyrtec, Claritin, or Allegra and increase your fluid intake. (Patients taking Tikosyn should avoid Benadryl, and may take Zyrtec, Claritin, or Allegra) If you experience trouble breathing, this can be serious. If it is severe call 911 IMMEDIATELY. If it is mild, please call our office.  We will call to schedule your test 2-4 weeks out understanding that some insurance companies will need an authorization prior to the service being performed.   For more information and frequently asked questions, please visit our website : [email or web address]  For non-scheduling related questions, please contact the cardiac imaging nurse navigator should you have any questions/concerns: Cardiac Imaging Nurse Navigators Direct Office Dial: [phone]   For scheduling needs, including cancellations and rescheduling, please call [place], [phone].    Follow-Up: At [facility], you and your health needs are our priority.  As part of our continuing mission to provide you with exceptional heart care, our providers are all part of one team.  This team includes your primary Cardiologist (physician) and Advanced  Practice Providers or APPs (Physician Assistants and Nurse Practitioners) who all work together to provide you with the care you need, when you need it.  Your next appointment:   2 month(s)  Provider:    You may see Dr. Darliss or one of the following Advanced Practice Providers on your designated Care Team:   Lonni Meager, NP Lesley Maffucci, PA-C Bernardino Bring, PA-C Cadence Orick, PA-C Tylene Lunch, NP Barnie Hila, NP    We recommend signing up for the patient portal called MyChart.  Sign up information is provided on this After Visit Summary.  MyChart is used to connect with patients for Virtual Visits (Telemedicine).  Patients are able to view lab/test results, encounter notes, upcoming appointments, etc.  Non-urgent messages can be sent to your provider as well.   To learn more about what you can do with MyChart, go to [email or web address].     Signed, Redell Darliss, MD  09/01/2023 9:57 AM    [facility]

## 2023-09-01 NOTE — Patient Instructions (Signed)
 Medication Instructions:  - take ONE 100 mg metoprolol  two hours prior to cardiac CT *If you need a refill on your cardiac medications before your next appointment, please call your pharmacy*  Lab Work: Your provider would like for you to have following labs drawn today BMP.    If you have labs (blood work) drawn today and your tests are completely normal, you will receive your results only by: MyChart Message (if you have MyChart) OR A paper copy in the mail If you have any lab test that is abnormal or we need to change your treatment, we will call you to review the results.  Testing/Procedures: Your physician has requested that you have an echocardiogram. Echocardiography is a painless test that uses sound waves to create images of your heart. It provides your doctor with information about the size and shape of your heart and how well your heart's chambers and valves are working.   You may receive an ultrasound enhancing agent through an IV if needed to better visualize your heart during the echo. This procedure takes approximately one hour.  There are no restrictions for this procedure.  This will take place at [address] [facility] [address]) [address], [place] [address]  Please note: We ask at that you not bring children with you during ultrasound (echo/ vascular) testing. Due to room size and safety concerns, children are not allowed in the ultrasound rooms during exams. Our front office staff cannot provide observation of children in our lobby area while testing is being conducted. An adult accompanying a patient to their appointment will only be allowed in the ultrasound room at the discretion of the ultrasound technician under special circumstances. We apologize for any inconvenience.     Your cardiac CT will be scheduled at:  [facility] [address] [place], [place] [address] [phone]  Please arrive 15 mins early for check-in and test  prep.  There is spacious parking and easy access to the radiology department from the [facility] Heart and Vascular entrance. Please enter here and check-in with the desk attendant.    Please follow these instructions carefully (unless otherwise directed):  An IV will be required for this test and Nitroglycerin will be given.  Hold all erectile dysfunction medications at least 3 days (72 hrs) prior to test. (Ie viagra , cialis, sildenafil , tadalafil, etc)     On the Night Before the Test: Be sure to Drink plenty of water. Do not consume any caffeinated/decaffeinated beverages or chocolate 12 hours prior to your test. Do not take any antihistamines 12 hours prior to your test.  On the Day of the Test: Drink plenty of water until 1 hour prior to the test. Do not eat any food 1 hour prior to test. You may take your regular medications prior to the test.  Take metoprolol  (Lopressor ) two hours prior to test. If you take Furosemide/Hydrochlorothiazide/Spironolactone/Chlorthalidone, please HOLD on the morning of the test. Patients who wear a continuous glucose monitor MUST remove the device prior to scanning. FEMALES- please wear underwire-free bra if available, avoid dresses & tight clothing       After the Test: Drink plenty of water. After receiving IV contrast, you may experience a mild flushed feeling. This is normal. On occasion, you may experience a mild rash up to 24 hours after the test. This is not dangerous. If this occurs, you can take Benadryl 25 mg, Zyrtec, Claritin, or Allegra and increase your fluid intake. (Patients taking Tikosyn  should avoid Benadryl, and may take Zyrtec, Claritin, or Allegra) If you experience trouble breathing, this can be serious. If it is severe call 911 IMMEDIATELY. If it is mild, please call our office.  We will call to schedule your test 2-4 weeks out understanding that some insurance companies will need an authorization prior to the service being  performed.   For more information and frequently asked questions, please visit our website : [email or web address]  For non-scheduling related questions, please contact the cardiac imaging nurse navigator should you have any questions/concerns: Cardiac Imaging Nurse Navigators Direct Office Dial: [phone]   For scheduling needs, including cancellations and rescheduling, please call [place], [phone].    Follow-Up: At [facility], you and your health needs are our priority.  As part of our continuing mission to provide you with exceptional heart care, our providers are all part of one team.  This team includes your primary Cardiologist (physician) and Advanced Practice Providers or APPs (Physician Assistants and Nurse Practitioners) who all work together to provide you with the care you need, when you need it.  Your next appointment:   2 month(s)  Provider:   You may see Dr. Darliss or one of the following Advanced Practice Providers on your designated Care Team:   Lonni Meager, NP Lesley Maffucci, PA-C Bernardino Bring, PA-C Cadence [place], PA-C Tylene Lunch, NP Barnie Hila, NP    We recommend signing up for the patient portal called MyChart.  Sign up information is provided on this After Visit Summary.  MyChart is used to connect with patients for Virtual Visits (Telemedicine).  Patients are able to view lab/test results, encounter notes, upcoming appointments, etc.  Non-urgent messages can be sent to your provider as well.   To learn more about what you can do with MyChart, go to [email or web address].

## 2023-09-01 NOTE — Progress Notes (Signed)
 Contacted via MyChart  Good afternoon Jarious, your monitor results returned and are overall stable.  A rare extra beat, which are common, but no concerning findings in regard to your chest pain.  I know you have a work-up coming up with cardiology.  I read note today.  We will see what all of this shows.  Have a great day!!

## 2023-09-03 LAB — BASIC METABOLIC PANEL WITH GFR
BUN/Creatinine Ratio: 15 (ref 9–20)
BUN: 13 mg/dL (ref 6–24)
CO2: 20 mmol/L (ref 20–29)
Calcium: 9 mg/dL (ref 8.7–10.2)
Chloride: 103 mmol/L (ref 96–106)
Creatinine, Ser: 0.89 mg/dL (ref 0.76–1.27)
Glucose: 78 mg/dL (ref 70–99)
Potassium: 4.3 mmol/L (ref 3.5–5.2)
Sodium: 141 mmol/L (ref 134–144)
eGFR: 106 mL/min/1.73 (ref >59)

## 2023-09-08 ENCOUNTER — Encounter (HOSPITAL_COMMUNITY): Payer: Self-pay

## 2023-09-10 ENCOUNTER — Ambulatory Visit
Admission: RE | Admit: 2023-09-10 | Discharge: 2023-09-10 | Disposition: A | Discharge status: Home / Self Care | Source: Ambulatory Visit | Attending: Cardiology | Admitting: Cardiology

## 2023-09-10 DIAGNOSIS — R072 Precordial pain: Secondary | ICD-10-CM | POA: Diagnosis present

## 2023-09-10 MED ORDER — NITROGLYCERIN 0.4 MG SL SUBL
0.8000 mg | SUBLINGUAL_TABLET | Freq: Once | SUBLINGUAL | Status: AC
  Administered 2023-09-10: 0.8 mg via SUBLINGUAL
  Filled 2023-09-10: qty 25

## 2023-09-10 MED ORDER — IOHEXOL 350 MG/ML SOLN
100.0000 mL | Freq: Once | INTRAVENOUS | Status: AC | PRN
  Administered 2023-09-10: 100 mL via INTRAVENOUS

## 2023-09-10 NOTE — Progress Notes (Signed)
 Patient tolerated CT well. Vital signs stable encourage to drink water throughout day.Reasons explained and verbalized understanding. Ambulated steady gait.

## 2023-09-11 ENCOUNTER — Encounter: Payer: Self-pay | Admitting: Cardiology

## 2023-09-13 IMAGING — CT CT HEAD W/O CM
3 of 4 series · 12 of 33 positions shown, 14 images · non-contrast
Comparison: None.

CLINICAL DATA: Facial trauma

EXAM:
CT HEAD WITHOUT CONTRAST
CT ANGIOGRAPHY OF THE HEAD AND NECK
TECHNIQUE: Contiguous axial images were obtained from the base of the skull
through the vertex without intravenous contrast. Multidetector CT
imaging of the head and neck was performed using the standard
protocol during bolus administration of intravenous contrast.
Multiplanar CT image reconstructions and MIPs were obtained to
evaluate the vascular anatomy. Carotid stenosis measurements (when
applicable) are obtained utilizing NASCET criteria, using the distal
internal carotid diameter as the denominator.

[Series 4: head bone · axial · 0.44mm/px · z∈[-54,+60]mm · 4 of 87 slices shown, 5 images]
[im 20/87  soft-tissue]
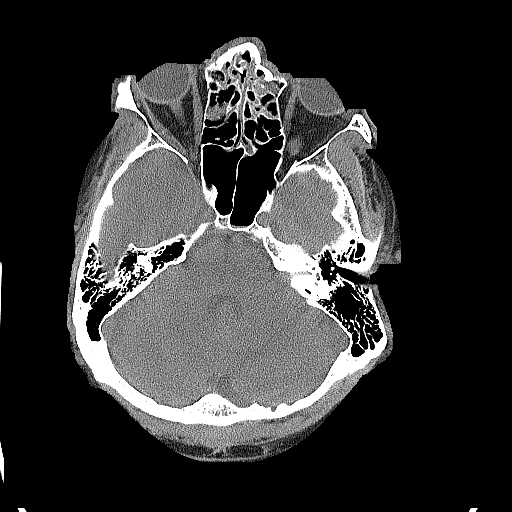
[im 20/87  bone]
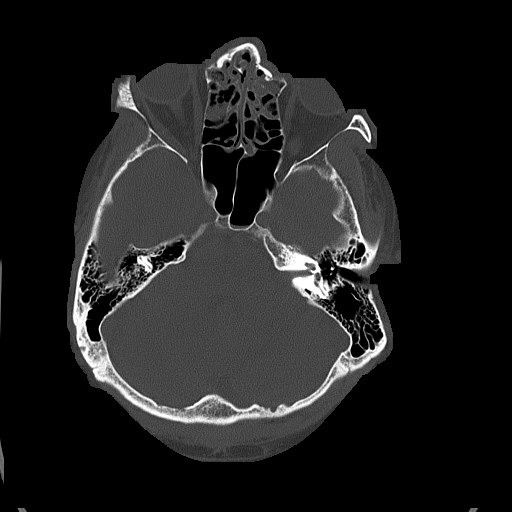
[im 39/87  bone]
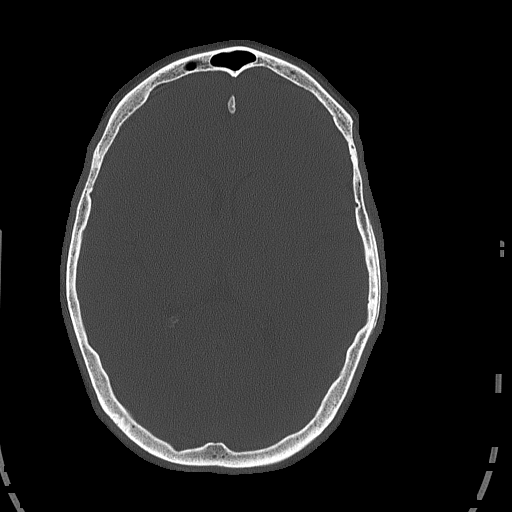
[im 58/87  bone]
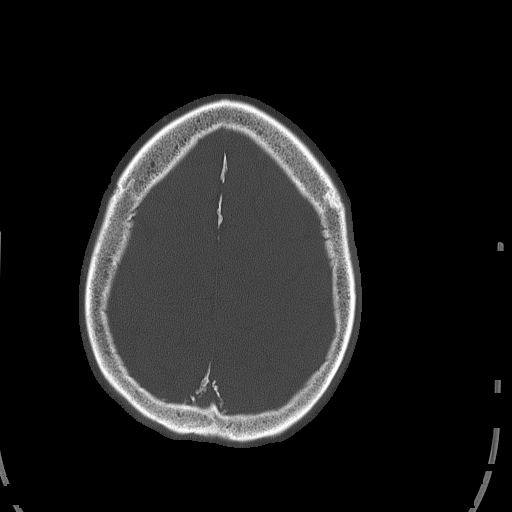
[im 77/87  bone]
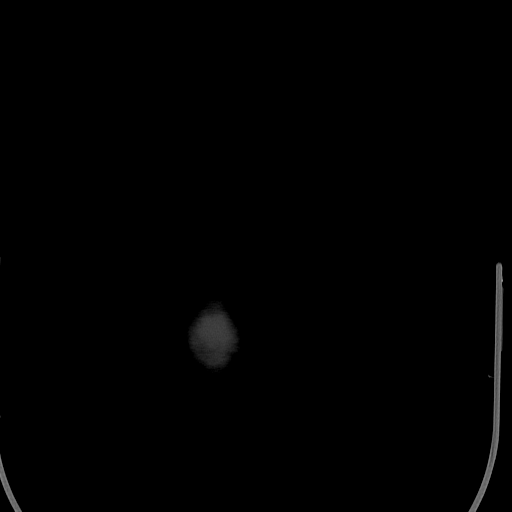

[Series 5: coronal · coronal · 0.34mm/px · 3 of 76 slices shown]
[im 30/76  bone]
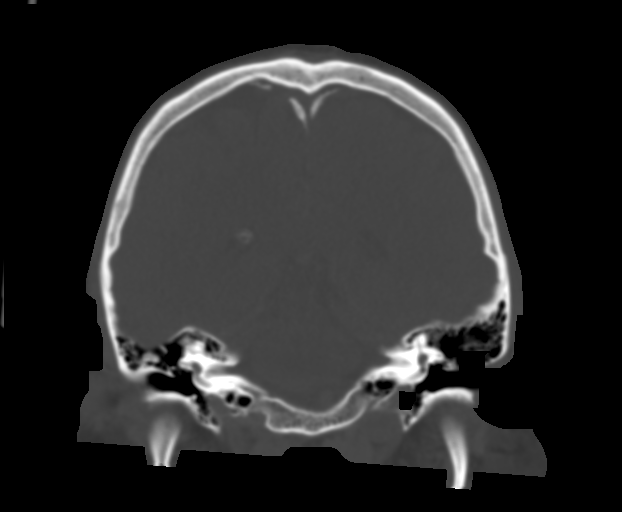
[im 35/76  bone]
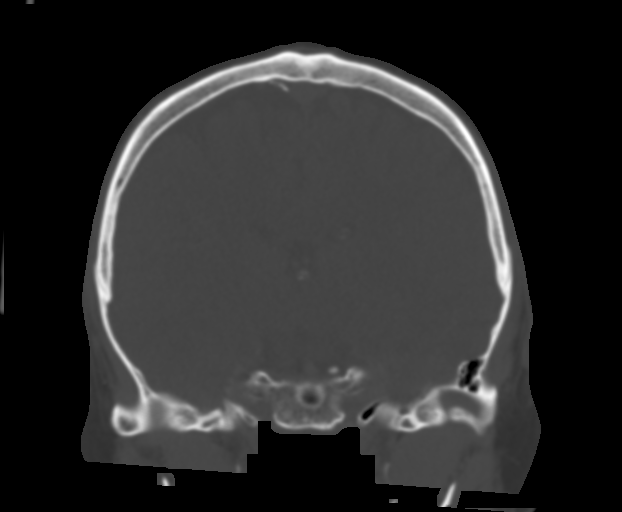
[im 41/76  bone]
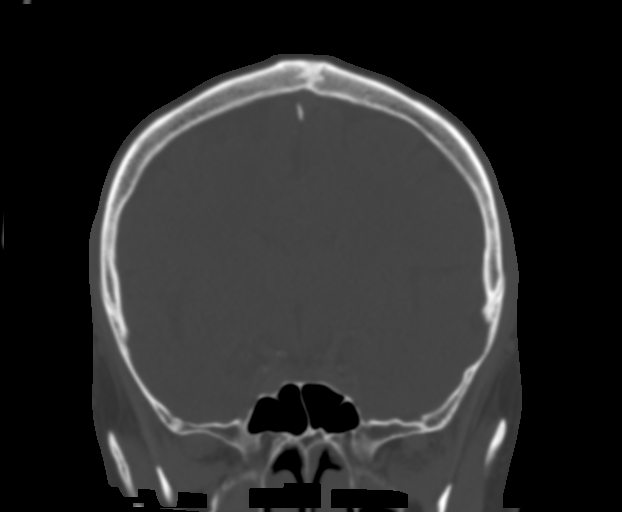

[Series 6: sagittal · sagittal · 0.34mm/px · 5 of 67 slices shown, 6 images]
[im 23/67  bone]
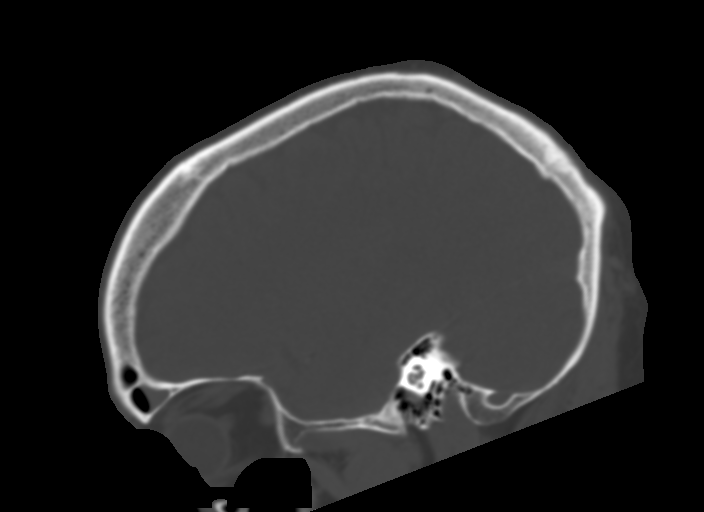
[im 28/67  bone]
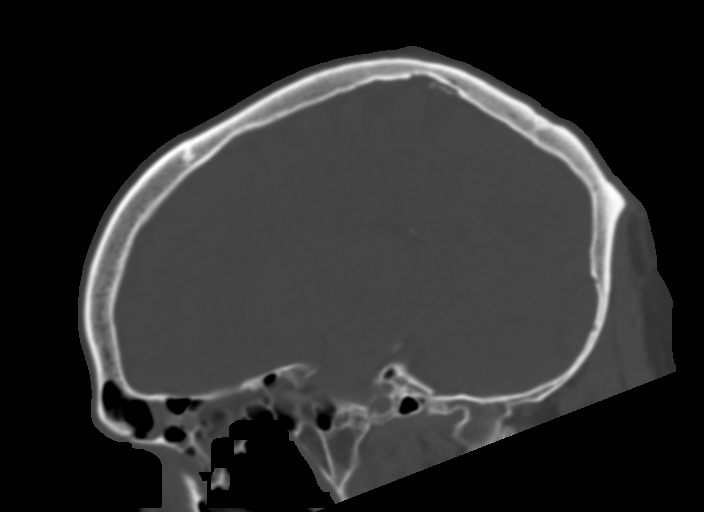
[im 34/67  soft-tissue]
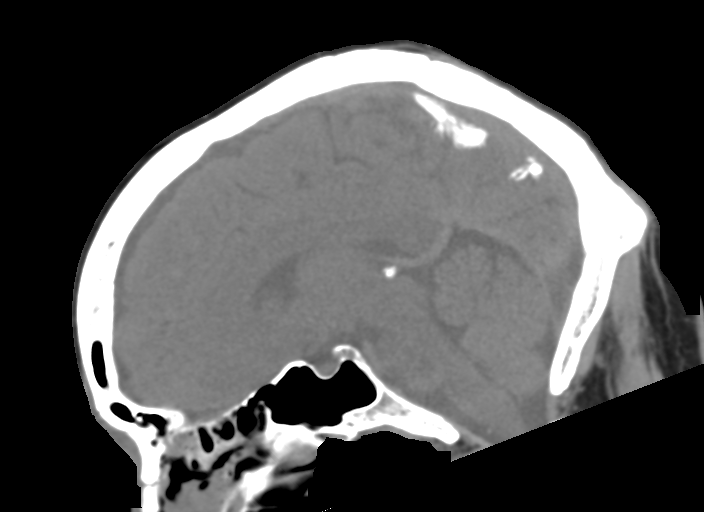
[im 34/67  bone]
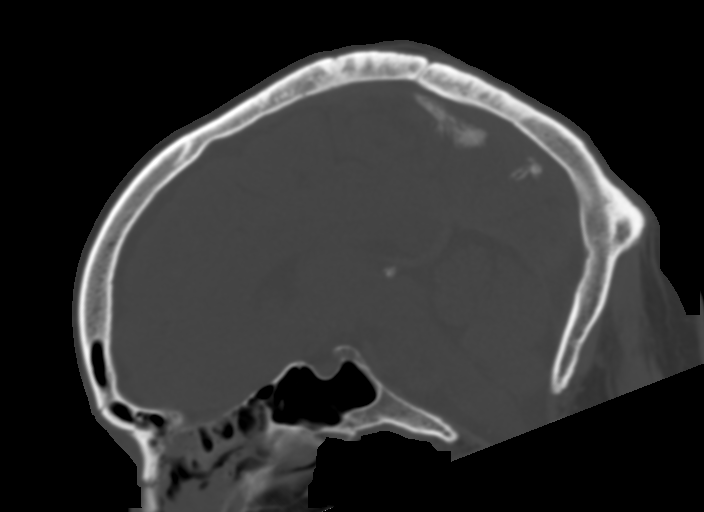
[im 39/67  bone]
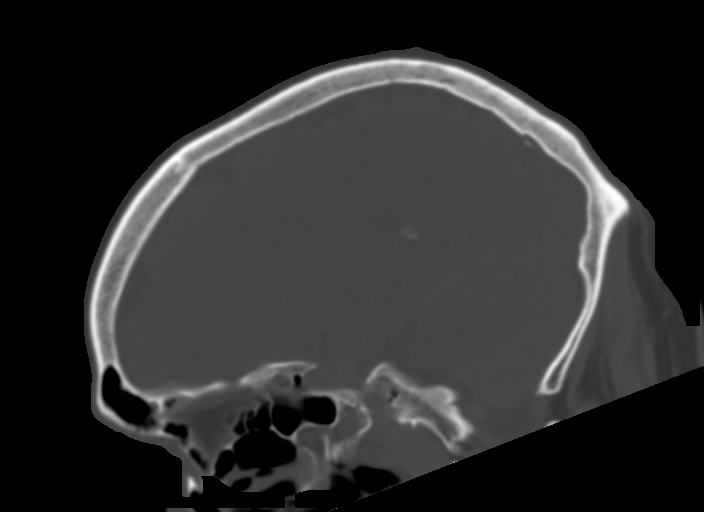
[im 45/67  bone]
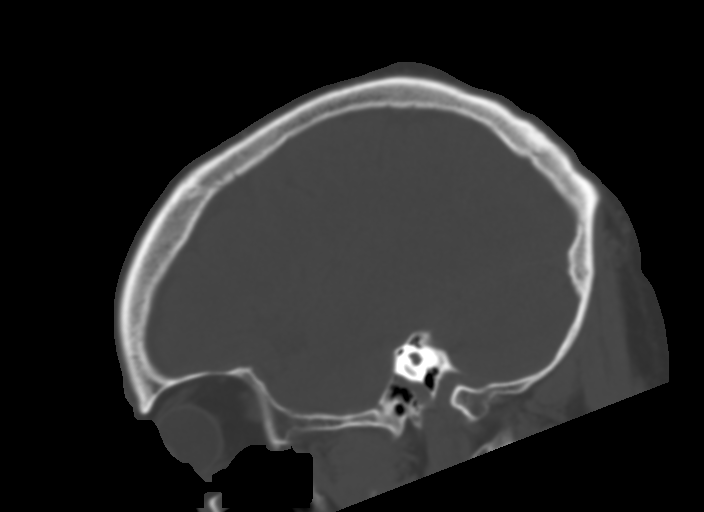

[12 of 33 positions shown; findings below may reference images not displayed]

RADIATION DOSE REDUCTION: This exam was performed according to the
departmental dose-optimization program which includes automated
exposure control, adjustment of the mA and/or kV according to
patient size and/or use of iterative reconstruction technique.

CONTRAST:  83mL OMNIPAQUE IOHEXOL 350 MG/ML SOLN
FINDINGS: CT HEAD FINDINGS

Brain: There is no mass, hemorrhage or extra-axial collection. The
size and configuration of the ventricles and extra-axial CSF spaces
are normal. There is no acute or chronic infarction. The brain
parenchyma is normal.

Skull: Facial fractures are described on the earlier maxillofacial
CT. In brief, there are comminuted fractures through the anterior
table of the frontal sinus and the nasoorbitoethmoidal complex.

CTA NECK FINDINGS

SKELETON: There is no bony spinal canal stenosis. No lytic or
blastic lesion.

OTHER NECK: Normal pharynx, larynx and major salivary glands. No
cervical lymphadenopathy. Unremarkable thyroid gland.

UPPER CHEST: No pneumothorax or pleural effusion. No nodules or
masses.

AORTIC ARCH:

There is no calcific atherosclerosis of the aortic arch. There is no
aneurysm, dissection or hemodynamically significant stenosis of the
visualized portion of the aorta. Conventional 3 vessel aortic
branching pattern. The visualized proximal subclavian arteries are
widely patent.

RIGHT CAROTID SYSTEM: Normal without aneurysm, dissection or
stenosis.

LEFT CAROTID SYSTEM: Normal without aneurysm, dissection or
stenosis.

VERTEBRAL ARTERIES: The vertebral arteries are congenitally
diminutive in the context of a left persistent primitive hypoglossal
artery.

CTA HEAD FINDINGS

POSTERIOR CIRCULATION:

--Vertebral arteries: Markedly diminutive vertebral arteries

--Inferior cerebellar arteries: Normal.

--Basilar artery: The basilar artery is supplied via a persistent
primitive hypo glossal artery that travels from the left ICA through
the left hypoglossal canal. The basilar artery is widely patent.

--Superior cerebellar arteries: Normal.

--Posterior cerebral arteries (PCA): Normal.

ANTERIOR CIRCULATION:

--Intracranial internal carotid arteries: Normal.

--Anterior cerebral arteries (ACA): Normal. Both A1 segments are
present. Patent anterior communicating artery (a-comm).

--Middle cerebral arteries (MCA): Normal.

VENOUS SINUSES: As permitted by contrast timing, patent.

ANATOMIC VARIANTS: Left persistent primitive hypoglossal artery

Review of the MIP images confirms the above findings.
IMPRESSION: 1. No acute intracranial abnormality.
2. No intracranial arterial occlusion or high-grade stenosis.
3. No carotid or vertebral dissection
4. Left persistent primitive hypoglossal artery, a rare normal
variant.
5. Facial fractures described on the earlier maxillofacial CT.

## 2023-09-13 IMAGING — CT CT ANGIO HEAD
2 of 7 series · 8 of 33 positions shown · non-contrast
Comparison: None.

CLINICAL DATA: Facial trauma

EXAM:
CT HEAD WITHOUT CONTRAST
CT ANGIOGRAPHY OF THE HEAD AND NECK
TECHNIQUE: Contiguous axial images were obtained from the base of the skull
through the vertex without intravenous contrast. Multidetector CT
imaging of the head and neck was performed using the standard
protocol during bolus administration of intravenous contrast.
Multiplanar CT image reconstructions and MIPs were obtained to
evaluate the vascular anatomy. Carotid stenosis measurements (when
applicable) are obtained utilizing NASCET criteria, using the distal
internal carotid diameter as the denominator.

[Series 3: cta neck · axial · 0.59mm/px · z∈[-259,-133]mm · 2 of 190 slices shown]
[im 64/190  soft-tissue]
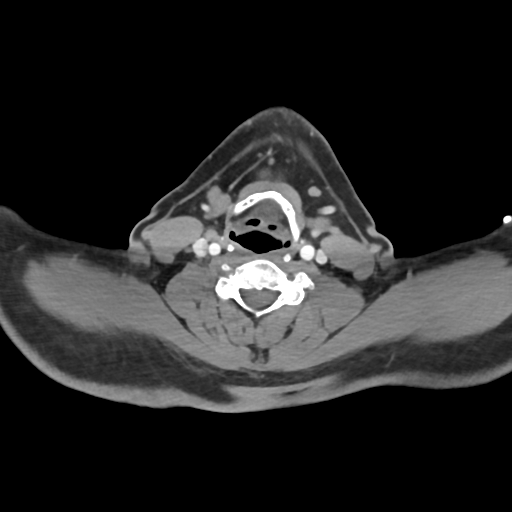
[im 127/190  bone]
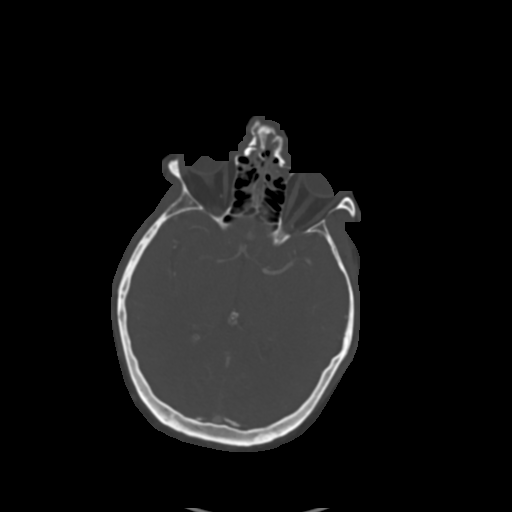

[Series 5: cta neck axial · axial · 0.39mm/px · z∈[-364,-121]mm · 6 of 343 slices shown]
[im 49/343  soft-tissue]
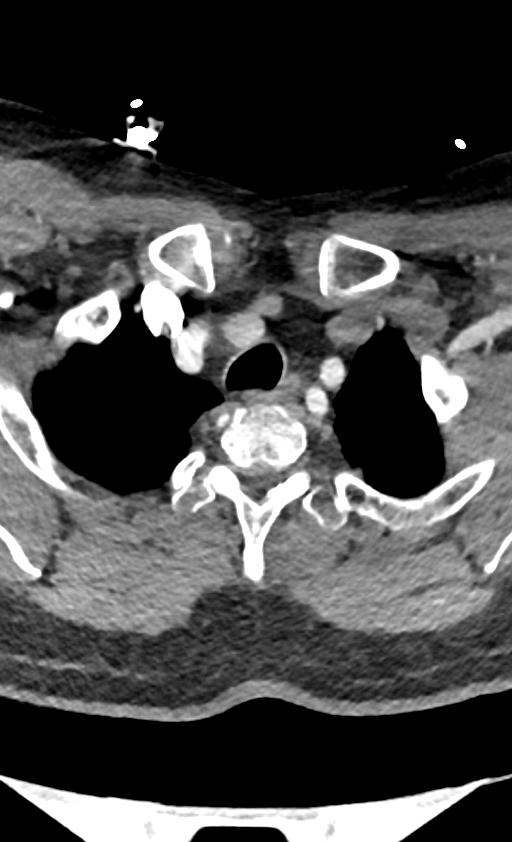
[im 98/343  soft-tissue]
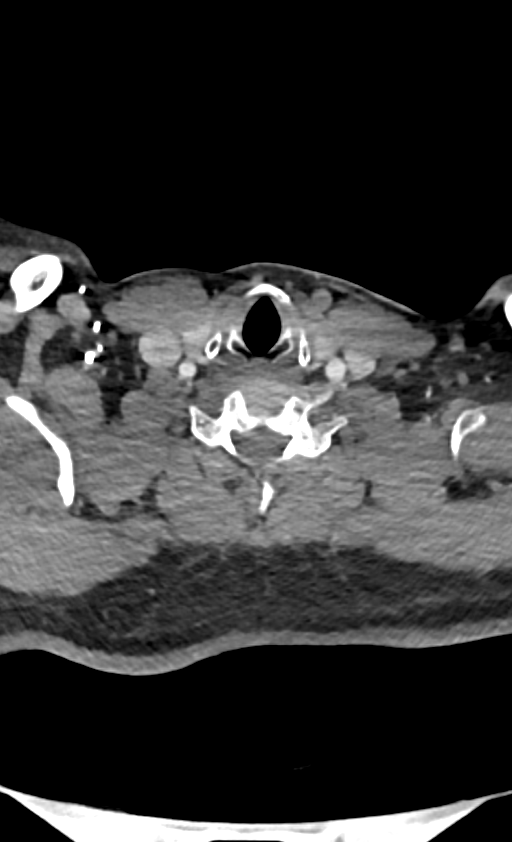
[im 147/343  soft-tissue]
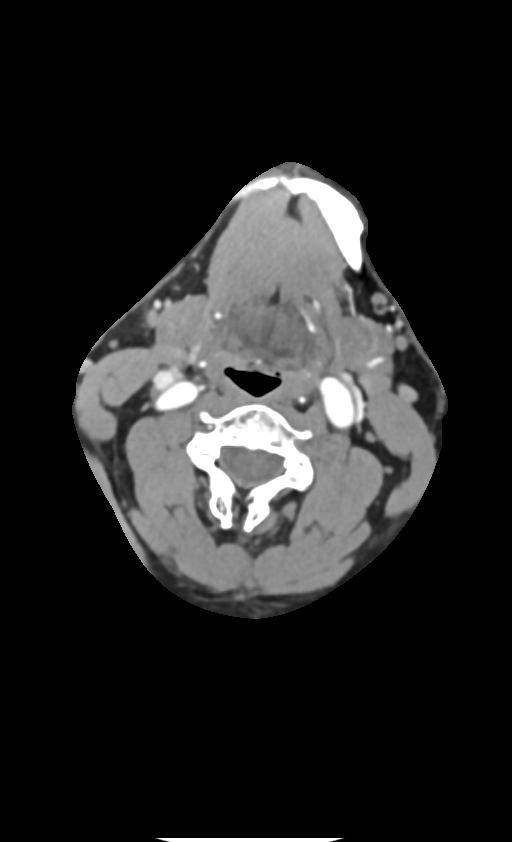
[im 196/343  soft-tissue]
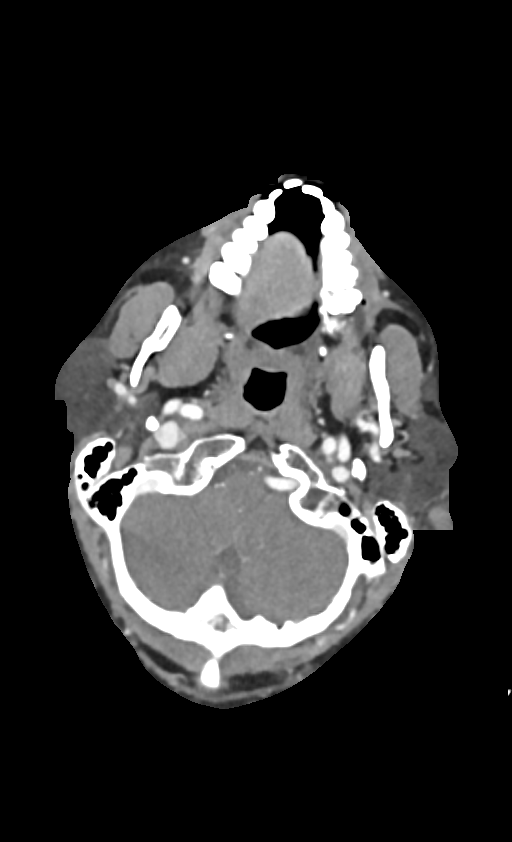
[im 245/343  soft-tissue]
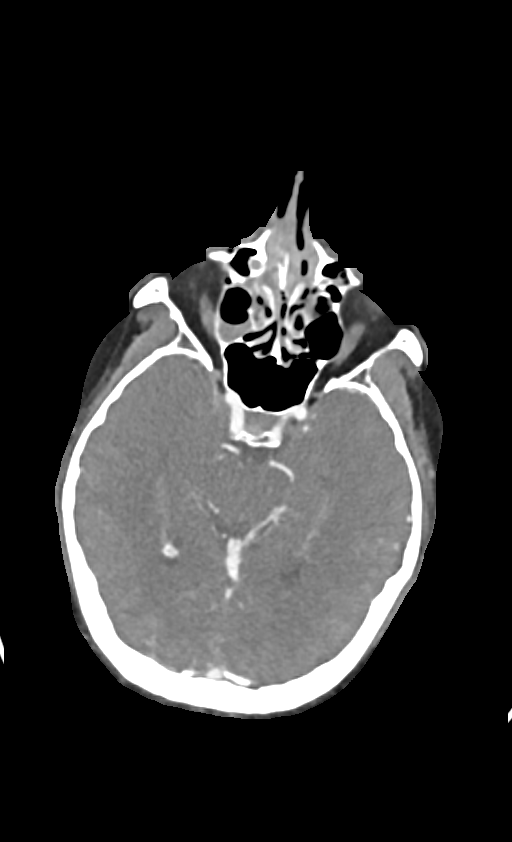
[im 294/343  soft-tissue]
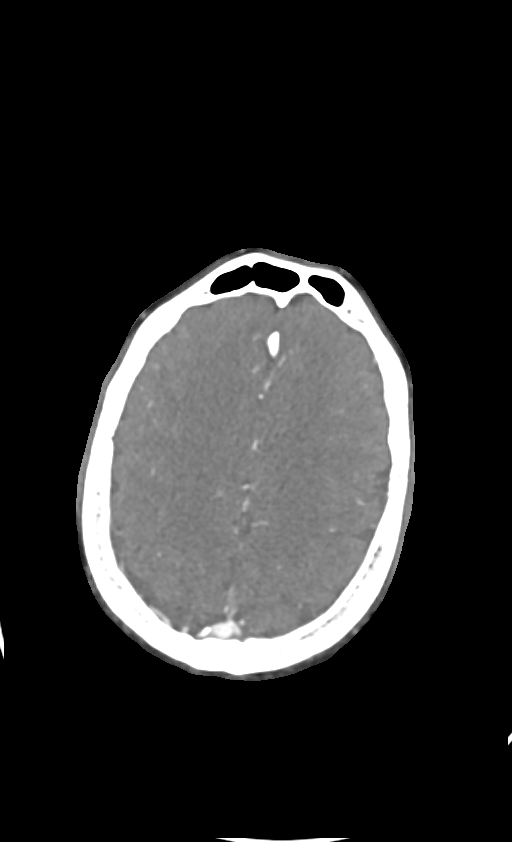

[8 of 33 positions shown; findings below may reference images not displayed]

RADIATION DOSE REDUCTION: This exam was performed according to the
departmental dose-optimization program which includes automated
exposure control, adjustment of the mA and/or kV according to
patient size and/or use of iterative reconstruction technique.

CONTRAST:  83mL OMNIPAQUE IOHEXOL 350 MG/ML SOLN
FINDINGS: CT HEAD FINDINGS

Brain: There is no mass, hemorrhage or extra-axial collection. The
size and configuration of the ventricles and extra-axial CSF spaces
are normal. There is no acute or chronic infarction. The brain
parenchyma is normal.

Skull: Facial fractures are described on the earlier maxillofacial
CT. In brief, there are comminuted fractures through the anterior
table of the frontal sinus and the nasoorbitoethmoidal complex.

CTA NECK FINDINGS

SKELETON: There is no bony spinal canal stenosis. No lytic or
blastic lesion.

OTHER NECK: Normal pharynx, larynx and major salivary glands. No
cervical lymphadenopathy. Unremarkable thyroid gland.

UPPER CHEST: No pneumothorax or pleural effusion. No nodules or
masses.

AORTIC ARCH:

There is no calcific atherosclerosis of the aortic arch. There is no
aneurysm, dissection or hemodynamically significant stenosis of the
visualized portion of the aorta. Conventional 3 vessel aortic
branching pattern. The visualized proximal subclavian arteries are
widely patent.

RIGHT CAROTID SYSTEM: Normal without aneurysm, dissection or
stenosis.

LEFT CAROTID SYSTEM: Normal without aneurysm, dissection or
stenosis.

VERTEBRAL ARTERIES: The vertebral arteries are congenitally
diminutive in the context of a left persistent primitive hypoglossal
artery.

CTA HEAD FINDINGS

POSTERIOR CIRCULATION:

--Vertebral arteries: Markedly diminutive vertebral arteries

--Inferior cerebellar arteries: Normal.

--Basilar artery: The basilar artery is supplied via a persistent
primitive hypo glossal artery that travels from the left ICA through
the left hypoglossal canal. The basilar artery is widely patent.

--Superior cerebellar arteries: Normal.

--Posterior cerebral arteries (PCA): Normal.

ANTERIOR CIRCULATION:

--Intracranial internal carotid arteries: Normal.

--Anterior cerebral arteries (ACA): Normal. Both A1 segments are
present. Patent anterior communicating artery (a-comm).

--Middle cerebral arteries (MCA): Normal.

VENOUS SINUSES: As permitted by contrast timing, patent.

ANATOMIC VARIANTS: Left persistent primitive hypoglossal artery

Review of the MIP images confirms the above findings.
IMPRESSION: 1. No acute intracranial abnormality.
2. No intracranial arterial occlusion or high-grade stenosis.
3. No carotid or vertebral dissection
4. Left persistent primitive hypoglossal artery, a rare normal
variant.
5. Facial fractures described on the earlier maxillofacial CT.

## 2023-09-14 ENCOUNTER — Ambulatory Visit: Payer: Self-pay | Admitting: Cardiology

## 2023-10-06 ENCOUNTER — Ambulatory Visit: Payer: Self-pay | Admitting: *Deleted

## 2023-10-06 NOTE — Telephone Encounter (Signed)
 FYI Only or Action Required?: FYI only for provider.  Patient was last seen in primary care on 07/30/2023 by Valerio Melanie DASEN, NP.  Called Nurse Triage reporting Back Pain.  Symptoms began several days ago.  Interventions attempted: Nothing.  Symptoms are: gradually worsening.  Triage Disposition: See HCP Within 4 Hours (Or PCP Triage)  Patient/caregiver understands and will follow disposition?: Call to [facility]- no open appointment- UC/Ortho UC advised    Reason for Disposition  [1] SEVERE back pain (e.g., excruciating, unable to do any normal activities) AND [2] not improved 2 hours after pain medicine  Answer Assessment - Initial Assessment Questions 1. ONSET: When did the pain begin? (e.g., minutes, hours, days)     Saturday 2. LOCATION: Where does it hurt? (upper, mid or lower back)     Lower midline 3. SEVERITY: How bad is the pain?  (e.g., Scale 1-10; mild, moderate, or severe)     8/10 4. PATTERN: Is the pain constant? (e.g., yes, no; constant, intermittent)      Constant if walking 5. RADIATION: Does the pain shoot into your legs or somewhere else?     no 6. CAUSE:  What do you think is causing the back pain?      Yes- inflamed 7. BACK OVERUSE:  Any recent lifting of heavy objects, strenuous work or exercise?     no 8. MEDICINES: What have you taken so far for the pain? (e.g., nothing, acetaminophen , NSAIDS)     nothing 9. NEUROLOGIC SYMPTOMS: Do you have any weakness, numbness, or problems with bowel/bladder control?     no 10. OTHER SYMPTOMS: Do you have any other symptoms? (e.g., fever, abdomen pain, burning with urination, blood in urine)       no  Protocols used: Back Pain-A-AH   Copied from CRM #[id number]. Topic: Clinical - Red Word Triage >> Oct 06, 2023  8:05 AM Marissa P wrote: Red Word that prompted transfer to Nurse Triage: Patient is having back pain and would like an appt.

## 2023-10-12 ENCOUNTER — Ambulatory Visit: Attending: Cardiology

## 2023-11-01 NOTE — Patient Instructions (Incomplete)
 Be Involved in Caring For Your Health:  Taking Medications When medications are taken as directed, they can greatly improve your health. But if they are not taken as prescribed, they may not work. In some cases, not taking them correctly can be harmful. To help ensure your treatment remains effective and safe, understand your medications and how to take them. Bring your medications to each visit for review by your provider.  Your lab results, notes, and after visit summary will be available on My Chart. We strongly encourage you to use this feature. If lab results are abnormal the clinic will contact you with the appropriate steps. If the clinic does not contact you assume the results are satisfactory. You can always view your results on My Chart. If you have questions regarding your health or results, please contact the clinic during office hours. You can also ask questions on My Chart.  We at [facility] are grateful that you chose [place] to provide your care. We strive to provide evidence-based and compassionate care and are always looking for feedback. If you get a survey from the clinic please complete this so we can hear your opinions.  Heart-Healthy Eating Plan Many factors influence your heart health, including eating and exercise habits. Heart health is also called coronary health. Coronary risk increases with abnormal blood fat (lipid) levels. A heart-healthy eating plan includes limiting unhealthy fats, increasing healthy fats, limiting salt (sodium) intake, and making other diet and lifestyle changes. What is my plan? Your health care provider may recommend that: You limit your fat intake to _________% or less of your total calories each day. You limit your saturated fat intake to _________% or less of your total calories each day. You limit the amount of cholesterol in your diet to less than _________ mg per day. You limit the amount of sodium in your diet to less than _________  mg per day. What are tips for following this plan? Cooking Cook foods using methods other than frying. Baking, boiling, grilling, and broiling are all good options. Other ways to reduce fat include: Removing the skin from poultry. Removing all visible fats from meats. Steaming vegetables in water or broth. Meal planning  At meals, imagine dividing your plate into fourths: Fill one-half of your plate with vegetables and green salads. Fill one-fourth of your plate with whole grains. Fill one-fourth of your plate with lean protein foods. Eat 2-4 cups of vegetables per day. One cup of vegetables equals 1 cup (91 g) broccoli or cauliflower florets, 2 medium carrots, 1 large bell pepper, 1 large sweet potato, 1 large tomato, 1 medium white potato, 2 cups (150 g) raw leafy greens. Eat 1-2 cups of fruit per day. One cup of fruit equals 1 small apple, 1 large banana, 1 cup (237 g) mixed fruit, 1 large orange,  cup (82 g) dried fruit, 1 cup (240 mL) 100% fruit juice. Eat more foods that contain soluble fiber. Examples include apples, broccoli, carrots, beans, peas, and barley. Aim to get 25-30 g of fiber per day. Increase your consumption of legumes, nuts, and seeds to 4-5 servings per week. One serving of dried beans or legumes equals  cup (90 g) cooked, 1 serving of nuts is  oz (12 almonds, 24 pistachios, or 7 walnut halves), and 1 serving of seeds equals  oz (8 g). Fats Choose healthy fats more often. Choose monounsaturated and polyunsaturated fats, such as olive and canola oils, avocado oil, flaxseeds, walnuts, almonds, and seeds. Eat  more omega-3 fats. Choose salmon, mackerel, sardines, tuna, flaxseed oil, and ground flaxseeds. Aim to eat fish at least 2 times each week. Check food labels carefully to identify foods with trans fats or high amounts of saturated fat. Limit saturated fats. These are found in animal products, such as meats, butter, and cream. Plant sources of saturated fats  include palm oil, palm kernel oil, and coconut oil. Avoid foods with partially hydrogenated oils in them. These contain trans fats. Examples are stick margarine, some tub margarines, cookies, crackers, and other baked goods. Avoid fried foods. General information Eat more home-cooked food and less restaurant, buffet, and fast food. Limit or avoid alcohol. Limit foods that are high in added sugar and simple starches such as foods made using white refined flour (white breads, pastries, sweets). Lose weight if you are overweight. Losing just 5-10% of your body weight can help your overall health and prevent diseases such as diabetes and heart disease. Monitor your sodium intake, especially if you have high blood pressure. Talk with your health care provider about your sodium intake. Try to incorporate more vegetarian meals weekly. What foods should I eat? Fruits All fresh, canned (in natural juice), or frozen fruits. Vegetables Fresh or frozen vegetables (raw, steamed, roasted, or grilled). Green salads. Grains Most grains. Choose whole wheat and whole grains most of the time. Rice and pasta, including brown rice and pastas made with whole wheat. Meats and other proteins Lean, well-trimmed beef, veal, pork, and lamb. Chicken and [place] without skin. All fish and shellfish. Wild duck, rabbit, pheasant, and venison. Egg whites or low-cholesterol egg substitutes. Dried beans, peas, lentils, and tofu. Seeds and most nuts. Dairy Low-fat or nonfat cheeses, including ricotta and mozzarella. Skim or 1% milk (liquid, powdered, or evaporated). Buttermilk made with low-fat milk. Nonfat or low-fat yogurt. Fats and oils Non-hydrogenated (trans-free) margarines. Vegetable oils, including soybean, sesame, sunflower, olive, avocado, peanut, safflower, corn, canola, and cottonseed. Salad dressings or mayonnaise made with a vegetable oil. Beverages Water (mineral or sparkling). Coffee and tea. Unsweetened ice  tea. Diet beverages. Sweets and desserts Sherbet, gelatin, and fruit ice. Small amounts of dark chocolate. Limit all sweets and desserts. Seasonings and condiments All seasonings and condiments. The items listed above may not be a complete list of foods and beverages you can eat. Contact a dietitian for more options. What foods should I avoid? Fruits Canned fruit in heavy syrup. Fruit in cream or butter sauce. Fried fruit. Limit coconut. Vegetables Vegetables cooked in cheese, cream, or butter sauce. Fried vegetables. Grains Breads made with saturated or trans fats, oils, or whole milk. Croissants. Sweet rolls. Donuts. High-fat crackers, such as cheese crackers and chips. Meats and other proteins Fatty meats, such as hot dogs, ribs, sausage, bacon, rib-eye roast or steak. High-fat deli meats, such as salami and bologna. Caviar. Domestic duck and goose. Organ meats, such as liver. Dairy Cream, sour cream, cream cheese, and creamed cottage cheese. Whole-milk cheeses. Whole or 2% milk (liquid, evaporated, or condensed). Whole buttermilk. Cream sauce or high-fat cheese sauce. Whole-milk yogurt. Fats and oils Meat fat, or shortening. Cocoa butter, hydrogenated oils, palm oil, coconut oil, palm kernel oil. Solid fats and shortenings, including bacon fat, salt pork, lard, and butter. Nondairy cream substitutes. Salad dressings with cheese or sour cream. Beverages Regular sodas and any drinks with added sugar. Sweets and desserts Frosting. Pudding. Cookies. Cakes. Pies. Milk chocolate or white chocolate. Buttered syrups. Full-fat ice cream or ice cream drinks. The items listed above may  not be a complete list of foods and beverages to avoid. Contact a dietitian for more information. Summary Heart-healthy meal planning includes limiting unhealthy fats, increasing healthy fats, limiting salt (sodium) intake and making other diet and lifestyle changes. Lose weight if you are overweight. Losing just  5-10% of your body weight can help your overall health and prevent diseases such as diabetes and heart disease. Focus on eating a balance of foods, including fruits and vegetables, low-fat or nonfat dairy, lean protein, nuts and legumes, whole grains, and heart-healthy oils and fats. This information is not intended to replace advice given to you by your health care provider. Make sure you discuss any questions you have with your health care provider. Document Revised: 03/11/2021 Document Reviewed: 03/11/2021 Elsevier Patient Education  2024 [employer].

## 2023-11-02 ENCOUNTER — Ambulatory Visit: Payer: Self-pay | Attending: Cardiology | Admitting: Cardiology

## 2023-11-02 ENCOUNTER — Other Ambulatory Visit

## 2023-11-05 ENCOUNTER — Ambulatory Visit: Admitting: Nurse Practitioner

## 2023-11-05 ENCOUNTER — Encounter: Payer: Self-pay | Admitting: Nurse Practitioner

## 2023-11-05 DIAGNOSIS — I10 Essential (primary) hypertension: Secondary | ICD-10-CM

## 2023-11-05 DIAGNOSIS — F418 Other specified anxiety disorders: Secondary | ICD-10-CM

## 2023-11-05 DIAGNOSIS — G4733 Obstructive sleep apnea (adult) (pediatric): Secondary | ICD-10-CM

## 2023-11-05 DIAGNOSIS — R002 Palpitations: Secondary | ICD-10-CM

## 2023-11-05 DIAGNOSIS — R7301 Impaired fasting glucose: Secondary | ICD-10-CM

## 2023-11-05 DIAGNOSIS — E7800 Pure hypercholesterolemia, unspecified: Secondary | ICD-10-CM

## 2023-11-05 DIAGNOSIS — Z23 Encounter for immunization: Secondary | ICD-10-CM

## 2023-11-05 NOTE — Telephone Encounter (Signed)
 Scheduled

## 2023-11-29 NOTE — Patient Instructions (Signed)
 Be Involved in Caring For Your Health:  Taking Medications When medications are taken as directed, they can greatly improve your health. But if they are not taken as prescribed, they may not work. In some cases, not taking them correctly can be harmful. To help ensure your treatment remains effective and safe, understand your medications and how to take them. Bring your medications to each visit for review by your provider.  Your lab results, notes, and after visit summary will be available on My Chart. We strongly encourage you to use this feature. If lab results are abnormal the clinic will contact you with the appropriate steps. If the clinic does not contact you assume the results are satisfactory. You can always view your results on My Chart. If you have questions regarding your health or results, please contact the clinic during office hours. You can also ask questions on My Chart.  We at [facility] are grateful that you chose us  to provide your care. We strive to provide evidence-based and compassionate care and are always looking for feedback. If you get a survey from the clinic please complete this so we can hear your opinions.  DASH Eating Plan DASH stands for Dietary Approaches to Stop Hypertension. The DASH eating plan is a healthy eating plan that has been shown to: Lower high blood pressure (hypertension). Reduce your risk for type 2 diabetes, heart disease, and stroke. Help with weight loss. What are tips for following this plan? Reading food labels Check food labels for the amount of salt (sodium) per serving. Choose foods with less than 5 percent of the Daily Value (DV) of sodium. In general, foods with less than 300 milligrams (mg) of sodium per serving fit into this eating plan. To find whole grains, look for the word whole as the first word in the ingredient list. Shopping Buy products labeled as low-sodium or no salt added. Buy fresh foods. Avoid canned  foods and pre-made or frozen meals. Cooking Try not to add salt when you cook. Use salt-free seasonings or herbs instead of table salt or sea salt. Check with your health care provider or pharmacist before using salt substitutes. Do not fry foods. Cook foods in healthy ways, such as baking, boiling, grilling, roasting, or broiling. Cook using oils that are good for your heart. These include olive, canola, avocado, soybean, and sunflower oil. Meal planning  Eat a balanced diet. This should include: 4 or more servings of fruits and 4 or more servings of vegetables each day. Try to fill half of your plate with fruits and vegetables. 6-8 servings of whole grains each day. 6 or less servings of lean meat, poultry, or fish each day. 1 oz is 1 serving. A 3 oz (85 g) serving of meat is about the same size as the palm of your hand. One egg is 1 oz (28 g). 2-3 servings of low-fat dairy each day. One serving is 1 cup (237 mL). 1 serving of nuts, seeds, or beans 5 times each week. 2-3 servings of heart-healthy fats. Healthy fats called omega-3 fatty acids are found in foods such as walnuts, flaxseeds, fortified milks, and eggs. These fats are also found in cold-water fish, such as sardines, salmon, and mackerel. Limit how much you eat of: Canned or prepackaged foods. Food that is high in trans fat, such as fried foods. Food that is high in saturated fat, such as fatty meat. Desserts and other sweets, sugary drinks, and other foods with added sugar. Full-fat  dairy products. Do not salt foods before eating. Do not eat more than 4 egg yolks a week. Try to eat at least 2 vegetarian meals a week. Eat more home-cooked food and less restaurant, buffet, and fast food. Lifestyle When eating at a restaurant, ask if your food can be made with less salt or no salt. If you drink alcohol: Limit how much you have to: 0-1 drink a day if you are male. 0-2 drinks a day if you are male. Know how much alcohol is in  your drink. In the [place] one drink is one 12 oz bottle of beer (355 mL), one 5 oz glass of wine (148 mL), or one 1 oz glass of hard liquor (44 mL). General information Avoid eating more than 2,300 mg of salt a day. If you have hypertension, you may need to reduce your sodium intake to 1,500 mg a day. Work with your provider to stay at a healthy body weight or lose weight. Ask what the best weight range is for you. On most days of the week, get at least 30 minutes of exercise that causes your heart to beat faster. This may include walking, swimming, or biking. Work with your provider or dietitian to adjust your eating plan to meet your specific calorie needs. What foods should I eat? Fruits All fresh, dried, or frozen fruit. Canned fruits that are in their natural juice and do not have sugar added to them. Vegetables Fresh or frozen vegetables that are raw, steamed, roasted, or grilled. Low-sodium or reduced-sodium tomato and vegetable juice. Low-sodium or reduced-sodium tomato sauce and tomato paste. Low-sodium or reduced-sodium canned vegetables. Grains Whole-grain or whole-wheat bread. Whole-grain or whole-wheat pasta. Brown rice. [place]ira. Bulgur. Whole-grain and low-sodium cereals. Pita bread. Low-fat, low-sodium crackers. Whole-wheat flour tortillas. Meats and other proteins Skinless chicken or [place]. Ground chicken or [place]. Pork with fat trimmed off. Fish and seafood. Egg whites. Dried beans, peas, or lentils. Unsalted nuts, nut butters, and seeds. Unsalted canned beans. Lean cuts of beef with fat trimmed off. Low-sodium, lean precooked or cured meat, such as sausages or meat loaves. Dairy Low-fat (1%) or fat-free (skim) milk. Reduced-fat, low-fat, or fat-free cheeses. Nonfat, low-sodium ricotta or cottage cheese. Low-fat or nonfat yogurt. Low-fat, low-sodium cheese. Fats and oils Soft margarine without trans fats. Vegetable oil. Reduced-fat, low-fat, or light mayonnaise and salad  dressings (reduced-sodium). Canola, safflower, olive, avocado, soybean, and sunflower oils. Avocado. Seasonings and condiments Herbs. Spices. Seasoning mixes without salt. Other foods Unsalted popcorn and pretzels. Fat-free sweets. The items listed above may not be all the foods and drinks you can have. Talk to a dietitian to learn more. What foods should I avoid? Fruits Canned fruit in a light or heavy syrup. Fried fruit. Fruit in cream or butter sauce. Vegetables Creamed or fried vegetables. Vegetables in a cheese sauce. Regular canned vegetables that are not marked as low-sodium or reduced-sodium. Regular canned tomato sauce and paste that are not marked as low-sodium or reduced-sodium. Regular tomato and vegetable juices that are not marked as low-sodium or reduced-sodium. Dene. Olives. Grains Baked goods made with fat, such as croissants, muffins, or some breads. Dry pasta or rice meal packs. Meats and other proteins Fatty cuts of meat. Ribs. Fried meat. [place]. Bologna, salami, and other precooked or cured meats, such as sausages or meat loaves, that are not lean and low in sodium. Fat from the back of a pig (fatback). Bratwurst. Salted nuts and seeds. Canned beans with added salt. Canned  or smoked fish. Whole eggs or egg yolks. Chicken or [place] with skin. Dairy Whole or 2% milk, cream, and half-and-half. Whole or full-fat cream cheese. Whole-fat or sweetened yogurt. Full-fat cheese. Nondairy creamers. Whipped toppings. Processed cheese and cheese spreads. Fats and oils Butter. Stick margarine. Lard. Shortening. Ghee. Bacon fat. Tropical oils, such as coconut, palm kernel, or palm oil. Seasonings and condiments Onion salt, garlic salt, seasoned salt, table salt, and sea salt. Worcestershire sauce. Tartar sauce. Barbecue sauce. Teriyaki sauce. Soy sauce, including reduced-sodium soy sauce. Steak sauce. Canned and packaged gravies. Fish sauce. Oyster sauce. Cocktail sauce. Store-bought  horseradish. Ketchup. Mustard. Meat flavorings and tenderizers. Bouillon cubes. Hot sauces. Pre-made or packaged marinades. Pre-made or packaged taco seasonings. Relishes. Regular salad dressings. Other foods Salted popcorn and pretzels. The items listed above may not be all the foods and drinks you should avoid. Talk to a dietitian to learn more. Where to find more information National Heart, Lung, and Blood Institute (NHLBI): BuffaloDryCleaner.gl American Heart Association (AHA): heart.org Academy of Nutrition and Dietetics: eatright.org National Kidney Foundation (NKF): kidney.org This information is not intended to replace advice given to you by your health care provider. Make sure you discuss any questions you have with your health care provider. Document Revised: 02/20/2022 Document Reviewed: 02/20/2022 Elsevier Patient Education  2024 [employer].

## 2023-12-04 ENCOUNTER — Ambulatory Visit (INDEPENDENT_AMBULATORY_CARE_PROVIDER_SITE_OTHER): Payer: Self-pay | Admitting: Nurse Practitioner

## 2023-12-04 ENCOUNTER — Encounter: Payer: Self-pay | Admitting: Nurse Practitioner

## 2023-12-04 VITALS — BP 122/74 | HR 64 | Temp 98.2°F | Ht 70.0 in | Wt 265.8 lb

## 2023-12-04 DIAGNOSIS — I10 Essential (primary) hypertension: Secondary | ICD-10-CM

## 2023-12-04 DIAGNOSIS — E7800 Pure hypercholesterolemia, unspecified: Secondary | ICD-10-CM

## 2023-12-04 DIAGNOSIS — F418 Other specified anxiety disorders: Secondary | ICD-10-CM | POA: Diagnosis not present

## 2023-12-04 DIAGNOSIS — R002 Palpitations: Secondary | ICD-10-CM | POA: Diagnosis not present

## 2023-12-04 DIAGNOSIS — Z23 Encounter for immunization: Secondary | ICD-10-CM

## 2023-12-04 DIAGNOSIS — E66812 Obesity, class 2: Secondary | ICD-10-CM

## 2023-12-04 DIAGNOSIS — Z6835 Body mass index (BMI) 35.0-35.9, adult: Secondary | ICD-10-CM

## 2023-12-04 DIAGNOSIS — E6609 Other obesity due to excess calories: Secondary | ICD-10-CM

## 2023-12-04 MED ORDER — SERTRALINE HCL 100 MG PO TABS: 200.0000 mg | ORAL_TABLET | Freq: Every day | ORAL | 4 refills | Status: AC

## 2023-12-04 NOTE — Assessment & Plan Note (Signed)
 Chronic, stable.  BP at goal today.  Continue Lisinopril  40 MG and Amlodipine  5 MG daily.  Educated him on medications and side effects to report.  Recommend he continue to monitor BP at home and document + focus on DASH diet.  To notify provider if any dry cough presents.  LABS: CMP.

## 2023-12-04 NOTE — Assessment & Plan Note (Signed)
 Improving at this time.  Will continue collaboration with cardiology. Recent notes and labs reviewed.

## 2023-12-04 NOTE — Assessment & Plan Note (Signed)
BMI 38.14.  Recommended eating smaller high protein, low fat meals more frequently and exercising 30 mins a day 5 times a week with a goal of 10-15lb weight loss in the next 3 months. Patient voiced their understanding and motivation to adhere to these recommendations. ? ?

## 2023-12-04 NOTE — Assessment & Plan Note (Signed)
 Chronic, stable. Continue Sertraline  to 200 MG (max dose) and Wellbutrin  XL 150 MG daily.  Denies SI/HI. Return in 6 months. Continue to collaborate with urology on low testosterone .

## 2023-12-04 NOTE — Assessment & Plan Note (Signed)
 Noted on past labs, check lipid panel today and CMP.  ASCVD 4.3%, diet focus at this time.

## 2023-12-04 NOTE — Progress Notes (Signed)
 BP 122/74   Pulse 64   Temp 98.2 F (36.8 C) (Oral)   Ht 5' 10 (1.778 m)   Wt 265 lb 12.8 oz (120.6 kg)   SpO2 98%   BMI 38.14 kg/m    Subjective:    Patient ID: Joe Gutierrez, male    DOB: [birth year], 48 y.o.   MRN: [id number]  HPI: Joe Gutierrez is a 48 y.o. male  Chief Complaint  Patient presents with   Anxiety   Depression   Hypertension   HYPERTENSION / HYPERLIPIDEMIA Taking Lisinopril  and Amlodipine . Saw cardiology 09/01/23 with no changes. Satisfied with current treatment? yes Duration of hypertension: chronic BP monitoring frequency: a few times a week BP range: 120/80 range BP medication side effects: no Duration of hyperlipidemia: chronic Aspirin: no Recent stressors: no Recurrent headaches: no Visual changes: no Palpitations: no Dyspnea: when climbing a lot of stairs Chest pain: improving Lower extremity edema: no Dizzy/lightheaded: when bending over at times  The 10-year ASCVD risk score (Arnett DK, et al., 2019) is: 4.3%   Values used to calculate the score:     Age: 32 years     Clincally relevant sex: Male     Is Non-Hispanic African American: No     Diabetic: No     Tobacco smoker: No     Systolic Blood Pressure: 122 mmHg     Is BP treated: Yes     HDL Cholesterol: 36 mg/dL     Total Cholesterol: 197 mg/dL   DEPRESSION Takes Wellbutrin  and Zoloft . Mood status: stable Satisfied with current treatment?: yes Symptom severity: moderate  Duration of current treatment : chronic Side effects: no Medication compliance: good compliance Psychotherapy/counseling: none Depressed mood: no Anxious mood: no Anhedonia: no Significant weight loss or gain: no Insomnia: none Fatigue: occasional Feelings of worthlessness or guilt: no Impaired concentration/indecisiveness: no Suicidal ideations: no Hopelessness: no Crying spells: no    12/04/2023    8:12 AM 07/30/2023    9:23 AM 06/15/2023    8:55 AM 09/12/2022    8:57 AM 06/09/2022    3:52  PM  Depression screen PHQ 2/9  Decreased Interest 0 1 1 1  0  Down, Depressed, Hopeless 0 1 0 0 0  PHQ - 2 Score 0 2 1 1  0  Altered sleeping 1 1 1 1 1   Tired, decreased energy 1 2 1 1 1   Change in appetite 1 1 1  0 0  Feeling bad or failure about yourself  0 1 0 0 0  Trouble concentrating 1 1 1  0 1  Moving slowly or fidgety/restless 0 0 0 0 0  Suicidal thoughts 0 0 0 0 0  PHQ-9 Score 4 8 5 3 3   Difficult doing work/chores Not difficult at all Not difficult at all Not difficult at all Not difficult at all Not difficult at all       12/04/2023    8:12 AM 07/30/2023    9:23 AM 06/15/2023    8:55 AM 09/12/2022    8:58 AM  GAD 7 : Generalized Anxiety Score  Nervous, Anxious, on Edge 0 1 1 1   Control/stop worrying 1 0 0 0  Worry too much - different things 1 0 1 0  Trouble relaxing 0 0 1 0  Restless 0 0 1 0  Easily annoyed or irritable 1 1 1 1   Afraid - awful might happen 0 0 0 0  Total GAD 7 Score 3 2 5 2   Anxiety Difficulty Not  difficult at all Not difficult at all Not difficult at all Not difficult at all   Relevant past medical, surgical, family and social history reviewed and updated as indicated. Interim medical history since our last visit reviewed. Allergies and medications reviewed and updated.  Review of Systems  Constitutional:  Negative for activity change, appetite change, diaphoresis, fatigue, fever and unexpected weight change.  Respiratory:  Positive for shortness of breath (occasional). Negative for cough, chest tightness and wheezing.   Cardiovascular:  Positive for chest pain (improving). Negative for palpitations and leg swelling.  Gastrointestinal: Negative.   Neurological: Negative.   Psychiatric/Behavioral: Negative.      Per HPI unless specifically indicated above     Objective:    BP 122/74   Pulse 64   Temp 98.2 F (36.8 C) (Oral)   Ht 5' 10 (1.778 m)   Wt 265 lb 12.8 oz (120.6 kg)   SpO2 98%   BMI 38.14 kg/m   Wt Readings from Last 3  Encounters:  12/04/23 265 lb 12.8 oz (120.6 kg)  09/01/23 269 lb 8 oz (122.2 kg)  07/30/23 265 lb 12.8 oz (120.6 kg)    Physical Exam Vitals and nursing note reviewed.  Constitutional:      General: He is awake. He is not in acute distress.    Appearance: He is well-developed and well-groomed. He is obese. He is not ill-appearing or toxic-appearing.  HENT:     Head: Normocephalic.     Right Ear: Hearing and external ear normal.     Left Ear: Hearing and external ear normal.  Eyes:     General: Lids are normal.     Extraocular Movements: Extraocular movements intact.     Conjunctiva/sclera: Conjunctivae normal.  Neck:     Thyroid: No thyromegaly.     Vascular: No carotid bruit.  Cardiovascular:     Rate and Rhythm: Normal rate and regular rhythm.     Heart sounds: Normal heart sounds. No murmur heard.    No gallop.  Pulmonary:     Effort: No accessory muscle usage or respiratory distress.     Breath sounds: Normal breath sounds.  Abdominal:     General: Bowel sounds are normal. There is no distension.     Palpations: Abdomen is soft.     Tenderness: There is no abdominal tenderness.  Musculoskeletal:     Cervical back: Full passive range of motion without pain.     Right lower leg: No edema.     Left lower leg: No edema.  Lymphadenopathy:     Cervical: No cervical adenopathy.  Skin:    General: Skin is warm.     Capillary Refill: Capillary refill takes less than 2 seconds.  Neurological:     Mental Status: He is alert and oriented to person, place, and time.     Deep Tendon Reflexes: Reflexes are normal and symmetric.     Reflex Scores:      Brachioradialis reflexes are 2+ on the right side and 2+ on the left side.      Patellar reflexes are 2+ on the right side and 2+ on the left side. Psychiatric:        Attention and Perception: Attention normal.        Mood and Affect: Mood normal.        Speech: Speech normal.        Behavior: Behavior normal. Behavior is  cooperative.        Thought Content: Thought  content normal.    Results for orders placed or performed in visit on 09/01/23  Basic metabolic panel with GFR   Collection Time: 09/01/23  9:51 AM  Result Value Ref Range   Glucose 78 70 - 99 mg/dL   BUN 13 6 - 24 mg/dL   Creatinine, Ser 9.10 0.76 - 1.27 mg/dL   eGFR 893 >40 fO/fpw/8.26   BUN/Creatinine Ratio 15 9 - 20   Sodium 141 134 - 144 mmol/L   Potassium 4.3 3.5 - 5.2 mmol/L   Chloride 103 96 - 106 mmol/L   CO2 20 20 - 29 mmol/L   Calcium 9.0 8.7 - 10.2 mg/dL      Assessment & Plan:   Problem List Items Addressed This Visit       Cardiovascular and Mediastinum   Essential hypertension, benign - Primary   Chronic, stable.  BP at goal today.  Continue Lisinopril  40 MG and Amlodipine  5 MG daily.  Educated him on medications and side effects to report.  Recommend he continue to monitor BP at home and document + focus on DASH diet.  To notify provider if any dry cough presents.  LABS: CMP.        Other   Palpitations   Improving at this time.  Will continue collaboration with cardiology. Recent notes and labs reviewed.      Obesity   BMI 38.14.  Recommended eating smaller high protein, low fat meals more frequently and exercising 30 mins a day 5 times a week with a goal of 10-15lb weight loss in the next 3 months. Patient voiced their understanding and motivation to adhere to these recommendations.       Elevated low density lipoprotein (LDL) cholesterol level   Noted on past labs, check lipid panel today and CMP.  ASCVD 4.3%, diet focus at this time.      Relevant Orders   Comprehensive metabolic panel with GFR   Lipid Panel w/o Chol/HDL Ratio   Depression with anxiety   Chronic, stable. Continue Sertraline  to 200 MG (max dose) and Wellbutrin  XL 150 MG daily.  Denies SI/HI. Return in 6 months. Continue to collaborate with urology on low testosterone .      Relevant Medications   sertraline  (ZOLOFT ) 100 MG tablet    Other Visit Diagnoses       Flu vaccine need       Flu vaccine today, educated patient.   Relevant Orders   Flu vaccine trivalent PF, 6mos and older(Flulaval,Afluria,Fluarix,Fluzone) (Completed)        Follow up plan: Return in about 6 months (around 06/15/2024) for Annual Physical -- after 06/14/24.

## 2023-12-05 LAB — COMPREHENSIVE METABOLIC PANEL WITH GFR
ALT: 21 IU/L (ref 0–44)
AST: 16 IU/L (ref 0–40)
Albumin: 4.6 g/dL (ref 4.1–5.1)
Alkaline Phosphatase: 81 IU/L (ref 47–123)
BUN/Creatinine Ratio: 16 (ref 9–20)
BUN: 14 mg/dL (ref 6–24)
Bilirubin Total: 0.4 mg/dL (ref 0.0–1.2)
CO2: 22 mmol/L (ref 20–29)
Calcium: 9.2 mg/dL (ref 8.7–10.2)
Chloride: 105 mmol/L (ref 96–106)
Creatinine, Ser: 0.85 mg/dL (ref 0.76–1.27)
Globulin, Total: 2 g/dL (ref 1.5–4.5)
Glucose: 97 mg/dL (ref 70–99)
Potassium: 4.5 mmol/L (ref 3.5–5.2)
Sodium: 141 mmol/L (ref 134–144)
Total Protein: 6.6 g/dL (ref 6.0–8.5)
eGFR: 107 mL/min/1.73 (ref >59)

## 2023-12-05 LAB — LIPID PANEL W/O CHOL/HDL RATIO
Cholesterol, Total: 179 mg/dL (ref 100–199)
HDL: 39 mg/dL — ABNORMAL LOW (ref >39)
LDL Chol Calc (NIH): 128 mg/dL — ABNORMAL HIGH (ref 0–99)
Triglycerides: 65 mg/dL (ref 0–149)
VLDL Cholesterol Cal: 12 mg/dL (ref 5–40)

## 2023-12-06 ENCOUNTER — Ambulatory Visit: Payer: Self-pay | Admitting: Nurse Practitioner

## 2023-12-06 NOTE — Progress Notes (Signed)
 Contacted via MyChart The 10-year ASCVD risk score (Arnett DK, et al., 2019) is: 3.4%   Values used to calculate the score:     Age: 48 years     Clincally relevant sex: Male     Is Non-Hispanic African American: No     Diabetic: No     Tobacco smoker: No     Systolic Blood Pressure: 122 mmHg     Is BP treated: Yes     HDL Cholesterol: 39 mg/dL     Total Cholesterol: 179 mg/dL  Good morning Joe Gutierrez, your labs have returned and overall remain stable with exception of lipid panel. This continues to show some elevations, for this continue focus on diet and exercise.  Any questions? Keep being amazing!!  Thank you for allowing me to participate in your care.  I appreciate you. Kindest regards, Landyn Buckalew

## 2023-12-09 ENCOUNTER — Ambulatory Visit: Payer: Self-pay | Attending: Cardiology

## 2023-12-09 DIAGNOSIS — R072 Precordial pain: Secondary | ICD-10-CM

## 2023-12-09 LAB — ECHOCARDIOGRAM COMPLETE
AR max vel: 5.11 cm2
AV Area VTI: 4.86 cm2
AV Area mean vel: 5.04 cm2
AV Mean grad: 3 mmHg
AV Peak grad: 6.1 mmHg
Ao pk vel: 1.23 m/s
Area-P 1/2: 4.31 cm2
S' Lateral: 3.03 cm

## 2024-02-09 NOTE — Patient Instructions (Signed)
Be Involved in Caring For Your Health:  Taking Medications When medications are taken as directed, they can greatly improve your health. But if they are not taken as prescribed, they may not work. In some cases, not taking them correctly can be harmful. To help ensure your treatment remains effective and safe, understand your medications and how to take them. Bring your medications to each visit for review by your provider.  Your lab results, notes, and after visit summary will be available on My Chart. We strongly encourage you to use this feature. If lab results are abnormal the clinic will contact you with the appropriate steps. If the clinic does not contact you assume the results are satisfactory. You can always view your results on My Chart. If you have questions regarding your health or results, please contact the clinic during office hours. You can also ask questions on My Chart.  We at [facility] are grateful that you chose [place] to provide your care. We strive to provide evidence-based and compassionate care and are always looking for feedback. If you get a survey from the clinic please complete this so we can hear your opinions.  Abdominal Pain, Adult  Many things can cause belly (abdominal) pain. In most cases, belly pain is not a serious problem and can be watched and treated at home. But in some cases, it can be serious. Your doctor will try to find the cause of your belly pain. Follow these instructions at home: Medicines Take over-the-counter and prescription medicines only as told by your doctor. Do not take medicines that help you poop (laxatives) unless told by your doctor. General instructions Watch your belly pain for any changes. Tell your doctor if the pain gets worse. Drink enough fluid to keep your pee (urine) pale yellow. Contact a doctor if: Your belly pain changes or gets worse. You have very bad cramping or bloating in your belly. You vomit. Your pain  gets worse with meals, after eating, or with certain foods. You have trouble pooping or have watery poop for more than 2-3 days. You are not hungry, or you lose weight without trying. You have signs of not getting enough fluid or water (dehydration). These may include: Dark pee, very little pee, or no pee. Cracked lips or dry mouth. Feeling sleepy or weak. You have pain when you pee or poop. Your belly pain wakes you up at night. You have blood in your pee. You have a fever. Get help right away if: You cannot stop vomiting. Your pain is only in one part of your belly, like on the right side. You have bloody or black poop, or poop that looks like tar. You have trouble breathing. You have chest pain. These symptoms may be an emergency. Get help right away. Call 911. Do not wait to see if the symptoms will go away. Do not drive yourself to the hospital. This information is not intended to replace advice given to you by your health care provider. Make sure you discuss any questions you have with your health care provider. Document Revised: 11/20/2021 Document Reviewed: 11/20/2021 Elsevier Patient Education  2024 [employer].

## 2024-02-10 ENCOUNTER — Ambulatory Visit: Admitting: Nurse Practitioner

## 2024-02-10 ENCOUNTER — Encounter: Payer: Self-pay | Admitting: Nurse Practitioner

## 2024-02-10 VITALS — BP 134/84 | HR 65 | Temp 97.6°F | Resp 17 | Ht 70.0 in | Wt 258.0 lb

## 2024-02-10 DIAGNOSIS — R1084 Generalized abdominal pain: Secondary | ICD-10-CM

## 2024-02-10 NOTE — Progress Notes (Signed)
 "  BP 134/84 (BP Location: Left Arm, Patient Position: Sitting, Cuff Size: Normal)   Pulse 65   Temp 97.6 F (36.4 C) (Oral)   Resp 17   Ht 5' 10 (1.778 m)   Wt 258 lb (117 kg)   SpO2 97%   BMI 37.02 kg/m    Subjective:    Patient ID: Joe Gutierrez, male    DOB: 11/06/1975, 48 y.o.   MRN: [id number]  HPI: Joe Gutierrez is a 48 y.o. male  Chief Complaint  Patient presents with   Hernia    Thinks that he may have a hernia   ABDOMINAL PAIN  Noted a knot to abdomen a few months back, but the area is now getting bigger. History of right colectomy on 01/24/21. Not really experiencing pain, but area feels weird over past few weeks. Duration:months Onset: sudden Severity: mild -- sometimes is more intense and has him ball over Quality: cramping Location:  diffuse  Episode duration:  Radiation: no Frequency: intermittent Alleviating factors: nothing Aggravating factors: nothing Status: fluctuating Treatments attempted: nothing Fever: no Nausea: no Vomiting: no Weight loss: yes -- but has been intentional = 11 lbs Decreased appetite: no Diarrhea: yes at baseline since past surgery Constipation: no Blood in stool: no Heartburn: no -- takes Pepcid  AC if present Jaundice: no Rash: no Dysuria/urinary frequency: no Hematuria: no History of sexually transmitted disease: no Recurrent NSAID use: no   Relevant past medical, surgical, family and social history reviewed and updated as indicated. Interim medical history since our last visit reviewed. Allergies and medications reviewed and updated.  Review of Systems  Constitutional:  Negative for activity change, appetite change, diaphoresis, fatigue and fever.  Respiratory:  Negative for cough, chest tightness, shortness of breath and wheezing.   Cardiovascular:  Negative for chest pain, palpitations and leg swelling.  Gastrointestinal:  Positive for abdominal pain (occasional mild cramping) and diarrhea (baseline). Negative  for abdominal distention, blood in stool, constipation, nausea and vomiting.  Neurological: Negative.   Psychiatric/Behavioral: Negative.      Per HPI unless specifically indicated above     Objective:    BP 134/84 (BP Location: Left Arm, Patient Position: Sitting, Cuff Size: Normal)   Pulse 65   Temp 97.6 F (36.4 C) (Oral)   Resp 17   Ht 5' 10 (1.778 m)   Wt 258 lb (117 kg)   SpO2 97%   BMI 37.02 kg/m   Wt Readings from Last 3 Encounters:  02/10/24 258 lb (117 kg)  12/04/23 265 lb 12.8 oz (120.6 kg)  09/01/23 269 lb 8 oz (122.2 kg)    Physical Exam Vitals and nursing note reviewed.  Constitutional:      General: He is awake. He is not in acute distress.    Appearance: He is well-developed and well-groomed. He is obese. He is not ill-appearing or toxic-appearing.  HENT:     Head: Normocephalic.     Right Ear: Hearing and external ear normal.     Left Ear: Hearing and external ear normal.  Eyes:     General: Lids are normal.     Extraocular Movements: Extraocular movements intact.     Conjunctiva/sclera: Conjunctivae normal.  Neck:     Thyroid: No thyromegaly.     Vascular: No carotid bruit.  Cardiovascular:     Rate and Rhythm: Normal rate and regular rhythm.     Heart sounds: Normal heart sounds. No murmur heard.    No gallop.  Pulmonary:  Effort: No accessory muscle usage or respiratory distress.     Breath sounds: Normal breath sounds. No decreased breath sounds, wheezing or rales.  Abdominal:     General: Bowel sounds are normal. There is no distension.     Palpations: Abdomen is soft.     Tenderness: There is no abdominal tenderness. There is no right CVA tenderness or left CVA tenderness.     Hernia: A hernia is present. Hernia is present in the ventral area.     Comments: Hernia palpated near umbilical area, site of previous surgery. Noted more with standing than lying. No pain on palpation.  Musculoskeletal:     Cervical back: Full passive range of  motion without pain.     Right lower leg: No edema.     Left lower leg: No edema.  Lymphadenopathy:     Cervical: No cervical adenopathy.  Skin:    General: Skin is warm.     Capillary Refill: Capillary refill takes less than 2 seconds.  Neurological:     Mental Status: He is alert and oriented to person, place, and time.     Deep Tendon Reflexes: Reflexes are normal and symmetric.     Reflex Scores:      Brachioradialis reflexes are 2+ on the right side and 2+ on the left side.      Patellar reflexes are 2+ on the right side and 2+ on the left side. Psychiatric:        Attention and Perception: Attention normal.        Mood and Affect: Mood normal.        Speech: Speech normal.        Behavior: Behavior normal. Behavior is cooperative.        Thought Content: Thought content normal.     Results for orders placed or performed in visit on 12/09/23  ECHOCARDIOGRAM COMPLETE   Collection Time: 12/09/23  9:51 AM  Result Value Ref Range   AR max vel 5.11 cm2   AV Peak grad 6.1 mmHg   Ao pk vel 1.23 m/s   S' Lateral 3.03 cm   Area-P 1/2 4.31 cm2   AV Area VTI 4.86 cm2   AV Mean grad 3.0 mmHg   AV Area mean vel 5.04 cm2   Est EF 60 - 65%       Assessment & Plan:   Problem List Items Addressed This Visit       Other   Generalized abdominal pain - Primary   Ongoing for a few months, no worsening. Suspect ventral hernia present to past surgical site. Educated him on this finding. Will obtain CT abdomen to further assess, discussed with him. He is not having any red flag symptoms, but educated him on these and need to go to ER if presents. Wear abdominal binder/support if up moving/working. No heavy lifting until further assessment performed. Determine next steps after CT results return, may send to general surgery.      Relevant Orders   CT ABDOMEN PELVIS W WO CONTRAST     Follow up plan: Return in about 4 weeks (around 03/09/2024) for Abdominal Pain.      "

## 2024-02-10 NOTE — Assessment & Plan Note (Signed)
 Ongoing for a few months, no worsening. Suspect ventral hernia present to past surgical site. Educated him on this finding. Will obtain CT abdomen to further assess, discussed with him. He is not having any red flag symptoms, but educated him on these and need to go to ER if presents. Wear abdominal binder/support if up moving/working. No heavy lifting until further assessment performed. Determine next steps after CT results return, may send to general surgery.

## 2024-02-16 ENCOUNTER — Telehealth: Payer: Self-pay

## 2024-02-16 DIAGNOSIS — R1084 Generalized abdominal pain: Secondary | ICD-10-CM

## 2024-02-16 NOTE — Telephone Encounter (Signed)
 Copied from CRM [id number]. Topic: Clinical - Request for Lab/Test Order >> Feb 16, 2024  1:35 PM Larissa RAMAN wrote: Reason for CRM: Tasha with [facility] states that the authorization for CT scan needs to be updated to show [facility] Patient's appointment is scheduled for 02/19/24. States this needs to be updated in order for imaging to be covered.   Callback# [phone] Ext. [phone]

## 2024-02-17 ENCOUNTER — Telehealth: Payer: Self-pay

## 2024-02-17 NOTE — Telephone Encounter (Signed)
Response to patient via mychart.

## 2024-02-17 NOTE — Telephone Encounter (Signed)
 Copied from CRM [id number]. Topic: Clinical - Request for Lab/Test Order >> Feb 17, 2024  8:21 AM Larissa RAMAN wrote: Reason for CRM: Patient states his insurance will only cover CT test if completed with [facility]. Patient is requesting to have order sent to [facility].

## 2024-02-19 ENCOUNTER — Ambulatory Visit

## 2024-02-24 NOTE — Telephone Encounter (Signed)
 Pt called for update on request, please advise   Best contact:  [phone]

## 2024-02-25 ENCOUNTER — Other Ambulatory Visit: Payer: Self-pay

## 2024-02-25 NOTE — Telephone Encounter (Signed)
 Corrected order has been faxed to [facility] and patient is aware he can proceed with scheduling. He will also let me know if he has any further delays.

## 2024-02-25 NOTE — Addendum Note (Signed)
 Addended by: JANEICE IZETTA HERO on: 02/25/2024 11:24 AM   Modules accepted: Orders

## 2024-02-26 ENCOUNTER — Ambulatory Visit
Admission: RE | Admit: 2024-02-26 | Discharge: 2024-02-26 | Disposition: A | Source: Ambulatory Visit | Attending: Nurse Practitioner | Admitting: Nurse Practitioner

## 2024-02-26 DIAGNOSIS — R1084 Generalized abdominal pain: Secondary | ICD-10-CM

## 2024-02-26 MED ORDER — IOPAMIDOL (ISOVUE-300) INJECTION 61%
100.0000 mL | Freq: Once | INTRAVENOUS | Status: AC | PRN
  Administered 2024-02-26: 100 mL via INTRAVENOUS

## 2024-02-27 ENCOUNTER — Ambulatory Visit: Payer: Self-pay | Admitting: Nurse Practitioner

## 2024-02-27 DIAGNOSIS — K429 Umbilical hernia without obstruction or gangrene: Secondary | ICD-10-CM

## 2024-02-27 NOTE — Progress Notes (Signed)
 Contacted via MyChart  Good afternoon Joe Gutierrez, your imaging has returned and as suspected there is a new small to moderate in size hernia present. Did you want to see general surgery or hold off? I recommend if you start having pain we get you in with them.

## 2024-03-06 NOTE — Patient Instructions (Signed)
 Hernia, Adult     A hernia happens when an organ or tissue inside your body pushes out through a weak spot in the muscles of your belly. This makes a bulge. The bulge may be: In a scar from surgery. This type of bulge is called an incisional hernia. Near your belly button. This type is called an umbilical hernia. In your groin, which is the area where your leg meets your lower belly. This type is called an inguinal hernia. If you're male, this type could also be in your scrotum. In your upper thigh. This type is called a femoral hernia. Inside your belly. This type is called a hiatal hernia. It happens when your stomach slides above your diaphragm, which is the muscle between your belly and your chest. What are the causes? You may get a hernia if: You lift heavy things. You cough over a long period of time. You have trouble pooping, also called constipation. Trouble pooping can lead to straining. There's a cut from surgery in your belly. You have a problem that's present at birth. There's fluid around your belly. You're male and have a testicle that hasn't moved down into your scrotum. You may be at greater risk for hernia if: You smoke. You're very overweight. What are the signs or symptoms? The main symptom is a bulge, but the bulge may not always be seen. It may grow bigger or be easier to see when you cough or strain, such as when you lift something heavy. If you can push the bulge back into your belly, it may not cause pain. If you can't push it back into your belly, it may lose its blood supply. This can cause: Pain. Fever. Nausea and vomiting. Swelling. Trouble pooping. How is this diagnosed? A hernia may be diagnosed based on your symptoms, medical history, and an exam. Your health care provider may ask you to cough or move in a way that makes the bulge easier to see. You may also have tests done. These may include: X-rays. Ultrasound. CT scan. How is this treated? A  hernia that's small and painless may not need to be treated. A hernia that's large or painful may be treated with surgery. Surgery involves pushing the bulge back into place and fixing the weak area of the muscle or belly. Follow these instructions at home: Activity Try not to strain. You may have to avoid lifting. Ask how much weight you can safely lift. If you lift something heavy, use your leg muscles. Do not use your back muscles to lift. Prevent trouble pooping You may need to take these actions to prevent or treat trouble pooping: Drink enough fluid to keep your pee (urine) pale yellow. Take medicines to help you poop. Eat foods high in fiber. These include beans, whole grains, and fresh fruits and vegetables. General instructions When you cough, try to cough gently. Try to push the bulge back in by very gently pressing on it when you're lying down. Do not try to force it back in if it won't push in easily. If you're overweight, work with your provider to lose weight safely. Do not smoke, vape, or use products with nicotine or tobacco in them. If you need help quitting, talk with your provider. If you're going to have surgery, watch your hernia for changes in shape, size, or color. Tell your provider about any changes. Contact a health care provider if: You get new pain, swelling, or redness near your hernia. You have trouble pooping.  Your poop is harder or larger than normal. You have a fever or chills. You have nausea or vomiting. Your hernia can't be pushed in. Get help right away if: You have belly pain that gets worse. Your hernia: Changes in shape or size. Changes color. Feels hard or hurts when you touch it. These symptoms may be an emergency. Call 911 right away. Do not wait to see if the symptoms will go away. Do not drive yourself to the hospital. This information is not intended to replace advice given to you by your health care provider. Make sure you discuss any  questions you have with your health care provider. Document Revised: 11/06/2022 Document Reviewed: 04/29/2022 Elsevier Patient Education  2024 [employer].

## 2024-03-09 ENCOUNTER — Emergency Department
Admission: EM | Admit: 2024-03-09 | Discharge: 2024-03-09 | Disposition: A | Discharge status: Home / Self Care | Attending: Emergency Medicine | Admitting: Emergency Medicine

## 2024-03-09 ENCOUNTER — Emergency Department

## 2024-03-09 ENCOUNTER — Other Ambulatory Visit: Payer: Self-pay

## 2024-03-09 ENCOUNTER — Ambulatory Visit: Payer: Self-pay

## 2024-03-09 DIAGNOSIS — K429 Umbilical hernia without obstruction or gangrene: Secondary | ICD-10-CM | POA: Insufficient documentation

## 2024-03-09 DIAGNOSIS — I10 Essential (primary) hypertension: Secondary | ICD-10-CM | POA: Diagnosis not present

## 2024-03-09 DIAGNOSIS — R109 Unspecified abdominal pain: Secondary | ICD-10-CM | POA: Diagnosis present

## 2024-03-09 LAB — COMPREHENSIVE METABOLIC PANEL WITH GFR
ALT: 21 U/L (ref 0–44)
AST: 23 U/L (ref 15–41)
Albumin: 4.8 g/dL (ref 3.5–5.0)
Alkaline Phosphatase: 99 U/L (ref 38–126)
Anion gap: 12 (ref 5–15)
BUN: 16 mg/dL (ref 6–20)
CO2: 28 mmol/L (ref 22–32)
Calcium: 10.1 mg/dL (ref 8.9–10.3)
Chloride: 102 mmol/L (ref 98–111)
Creatinine, Ser: 0.97 mg/dL (ref 0.61–1.24)
GFR, Estimated: >60 mL/min
Glucose, Bld: 89 mg/dL (ref 70–99)
Potassium: 5 mmol/L (ref 3.5–5.1)
Sodium: 142 mmol/L (ref 135–145)
Total Bilirubin: 0.3 mg/dL (ref 0.0–1.2)
Total Protein: 7.6 g/dL (ref 6.5–8.1)

## 2024-03-09 LAB — CBC
HCT: 48.6 % (ref 39.0–52.0)
Hemoglobin: 16 g/dL (ref 13.0–17.0)
MCH: 28.8 pg (ref 26.0–34.0)
MCHC: 32.9 g/dL (ref 30.0–36.0)
MCV: 87.4 fL (ref 80.0–100.0)
Platelets: 251 K/uL (ref 150–400)
RBC: 5.56 MIL/uL (ref 4.22–5.81)
RDW: 11.9 % (ref 11.5–15.5)
WBC: 10.1 K/uL (ref 4.0–10.5)
nRBC: 0 % (ref 0.0–0.2)

## 2024-03-09 LAB — URINALYSIS, ROUTINE W REFLEX MICROSCOPIC
Bilirubin Urine: NEGATIVE
Glucose, UA: NEGATIVE mg/dL
Hgb urine dipstick: NEGATIVE
Ketones, ur: NEGATIVE mg/dL
Leukocytes,Ua: NEGATIVE
Nitrite: NEGATIVE
Protein, ur: NEGATIVE mg/dL
Specific Gravity, Urine: 1.018 (ref 1.005–1.030)
pH: 6 (ref 5.0–8.0)

## 2024-03-09 LAB — LIPASE, BLOOD: Lipase: 34 U/L (ref 11–51)

## 2024-03-09 MED ORDER — HYDROMORPHONE HCL 1 MG/ML IJ SOLN
1.0000 mg | Freq: Once | INTRAMUSCULAR | Status: AC
  Administered 2024-03-09: 1 mg via INTRAVENOUS
  Filled 2024-03-09: qty 1

## 2024-03-09 MED ORDER — ONDANSETRON 4 MG PO TBDP: 4.0000 mg | ORAL_TABLET | Freq: Three times a day (TID) | ORAL | 0 refills | Status: AC | PRN

## 2024-03-09 MED ORDER — OXYCODONE-ACETAMINOPHEN 5-325 MG PO TABS
1.0000 | ORAL_TABLET | Freq: Once | ORAL | Status: AC
  Administered 2024-03-09: 1 via ORAL
  Filled 2024-03-09: qty 1

## 2024-03-09 MED ORDER — OXYCODONE-ACETAMINOPHEN 5-325 MG PO TABS: 1.0000 | ORAL_TABLET | ORAL | 0 refills | Status: AC | PRN

## 2024-03-09 MED ORDER — OXYCODONE-ACETAMINOPHEN 5-325 MG PO TABS: 1.0000 | ORAL_TABLET | ORAL | 0 refills | Status: DC | PRN

## 2024-03-09 MED ORDER — ONDANSETRON HCL 4 MG/2ML IJ SOLN
4.0000 mg | Freq: Once | INTRAMUSCULAR | Status: AC
  Administered 2024-03-09: 4 mg via INTRAVENOUS
  Filled 2024-03-09: qty 2

## 2024-03-09 MED ORDER — IOHEXOL 300 MG/ML  SOLN
100.0000 mL | Freq: Once | INTRAMUSCULAR | Status: AC | PRN
  Administered 2024-03-09: 100 mL via INTRAVENOUS

## 2024-03-09 MED ORDER — SODIUM CHLORIDE 0.9 % IV BOLUS: 1000.0000 mL | Freq: Once | INTRAVENOUS | Status: DC

## 2024-03-09 NOTE — ED Notes (Signed)
 Report received from off going RN. Pt resting in stretcher, provided with TV remote per request, denies any additional needs at this time, awaiting CT read. Call bell within reach, spouse at bedside

## 2024-03-09 NOTE — Telephone Encounter (Signed)
 FYI Only or Action Required?: FYI only for provider: ED advised.  Patient was last seen in primary care on 02/10/2024 by Valerio Melanie DASEN, NP.  Called Nurse Triage reporting Abdominal Pain.  Symptoms began yesterday.  Interventions attempted: Rest, hydration, or home remedies.  Symptoms are: gradually worsening.  Triage Disposition: See HCP Within 4 Hours (Or PCP Triage)  Patient/caregiver understands and will follow disposition?: Yes       Message from [facility] sent at 03/09/2024  1:54 PM EST  Summary: severe pain   Reason for Triage: patient says he is  having severe pain on the left bottom  side of his abdomen.  He has been vomiting up everything he  eats since last nght.         Reason for Disposition  [1] Vomiting AND [2] abdomen looks much more swollen than usual  Answer Assessment - Initial Assessment Questions LOCATION: Where does it hurt?      LLQ, hernia present   ONSET: When did the pain begin? (Minutes, hours or days ago)      Has hernia noted with imaging done in past, relates to hernia but pain is not going away.  This episode started yesterday around lunch, eased up, but hasn't gone away.   SEVERITY: How bad is the pain?  (e.g., Scale 1-10; mild, moderate, or severe)     Stabbing 6/10, also described as dull ache, denies as severe or excruciating  CAUSE: What do you think is causing the stomach pain? (e.g., gallstones, recent abdominal surgery)     Hernia  OTHER SYMPTOMS: Do you have any other symptoms? (e.g., back pain, diarrhea, fever, urination pain, vomiting)       Vomiting- denies any blood, coffee ground emesis or green color.  Ate green beans yesterday. Not urinating as much today as usual but able to empty bladder. Notes being more bloated than usual. Last BM was this AM (small, brown, formed, soft).  History of feeling dizzy when leaning over and standing back upright relates to high BP.  Denies any dizziness at rest or when not  moving.  Protocols used: Abdominal Pain - Male-A-AH

## 2024-03-09 NOTE — Discharge Instructions (Addendum)
 Please take your pain medication as needed but only as prescribed.  Do not drink alcohol or drive while taking pain medication.  Please follow-up with your surgeon on Monday as scheduled to discuss your hernia.  Return to the emergency department for any significant worsening of pain, constipation, vomiting or any other symptom per se concerning to yourself

## 2024-03-09 NOTE — ED Provider Notes (Signed)
 "  [facility] Provider Note    Event Date/Time   First MD Initiated Contact with Patient 03/09/24 1645     (approximate)  History   Chief Complaint: Abdominal Pain  HPI  Joe Gutierrez is a 49 y.o. male with a past medical history of anxiety, hypertension, known umbilical hernia who presents to the emergency department for worsening pain around the umbilicus.  According to the patient he states over the last day or so the pain around his umbilicus has worsened he has been experiencing nausea and vomiting.  Patient did state several normal-appearing bowel movements today no constipation.  Patient has a known umbilical hernia actually had a appointment with a surgeon next week to discuss elective care but due to the worsening pain he came to the emergency department for evaluation.  Physical Exam   Triage Vital Signs: ED Triage Vitals [03/09/24 1448]  Encounter Vitals Group     BP (!) 122/93     Girls Systolic BP Percentile      Girls Diastolic BP Percentile      Boys Systolic BP Percentile      Boys Diastolic BP Percentile      Pulse Rate 91     Resp 18     Temp 98.9 F (37.2 C)     Temp Source Oral     SpO2 98 %     Weight      Height      Head Circumference      Peak Flow      Pain Score 8     Pain Loc      Pain Education      Exclude from Growth Chart     Most recent vital signs: Vitals:   03/09/24 1448  BP: (!) 122/93  Pulse: 91  Resp: 18  Temp: 98.9 F (37.2 C)  SpO2: 98%    General: Awake, no distress.  CV:  Good peripheral perfusion.  Regular rate and rhythm  Resp:  Normal effort.  Equal breath sounds bilaterally.  Abd:  No distention.  Soft, states mild tenderness palpation to the left side of the umbilicus.  No obvious palpable mass or hernia mass on my examination, however examination is somewhat limited by body habitus.  ED Results / Procedures / Treatments   RADIOLOGY  I have reviewed interpret the CT images.  Patient  has a large umbilical hernia I do not appreciate any bowel obstruction. Radiologist read the CT scan as moderate fat-containing supraumbilical hernia with a knuckle of small bowel mild progression.  No bowel obstruction.   MEDICATIONS ORDERED IN ED: Medications  ondansetron  (ZOFRAN ) injection 4 mg (has no administration in time range)  HYDROmorphone  (DILAUDID ) injection 1 mg (has no administration in time range)  sodium chloride  0.9 % bolus 1,000 mL (has no administration in time range)     IMPRESSION / MDM / ASSESSMENT AND PLAN / ED COURSE  I reviewed the triage vital signs and the nursing notes.  Patient's presentation is most consistent with acute presentation with potential threat to life or bodily function.  Patient presents emergency department for abdominal pain around his umbilicus.  Patient also states nausea vomiting.  He has had several normal bowel movements per patient.  Lab work today is reassuring with a normal CBC with a normal white blood cell count reassuring chemistry with normal LFTs normal lipase.  However given the patient's worsening pain and known umbilical hernia we will obtain CT imaging the  abdomen pelvis with contrast to further evaluate.  Will treat with nausea medication pain medication IV fluids while waiting CT results.  Patient agreeable to plan of care.  Patient's lab work is reassuring with a normal CBC normal chemistry normal urinalysis normal LFTs and lipase.  CT scan does show a fat-containing hernia with a small knuckle of intestine but no sign of obstruction.  Patient has an appointment on Monday with surgery to discuss operative repair.  Will discharge on pain medication.  I discussed my typical hernia return precautions.  Patient agreeable to plan of care.  FINAL CLINICAL IMPRESSION(S) / ED DIAGNOSES   Abdominal pain Umbilical hernia  Note:  This document was prepared using Dragon voice recognition software and may include unintentional dictation  errors.   Dorothyann Drivers, MD 03/09/24 [phone]  "

## 2024-03-09 NOTE — ED Triage Notes (Signed)
 Pt to ED via POV from home. Pt reports known hernia and told by doctor if symptoms get worse to be seen. Pt reports worsening pain accompanied with N/V. Pt states meeting with surgeon next week.

## 2024-03-10 ENCOUNTER — Encounter: Payer: Self-pay | Admitting: Nurse Practitioner

## 2024-03-10 ENCOUNTER — Ambulatory Visit: Admitting: Nurse Practitioner

## 2024-03-10 VITALS — BP 119/76 | HR 65 | Temp 98.5°F | Ht 70.0 in | Wt 256.6 lb

## 2024-03-10 DIAGNOSIS — K429 Umbilical hernia without obstruction or gangrene: Secondary | ICD-10-CM | POA: Diagnosis not present

## 2024-03-10 NOTE — Progress Notes (Signed)
 "  BP 119/76   Pulse 65   Temp 98.5 F (36.9 C) (Oral)   Ht 5' 10 (1.778 m)   Wt 256 lb 9.6 oz (116.4 kg)   SpO2 94%   BMI 36.82 kg/m    Subjective:    Patient ID: Joe Gutierrez, male    DOB: October 24, 1975, 49 y.o.   MRN: [id number]  HPI: Joe Gutierrez is a 49 y.o. male  Chief Complaint  Patient presents with   Abdominal Pain    4 week f/up- patient states he was seen at the ER yesterday for his pain, given Oxycodone  and Zofran . Has an appointment to see general surgery on Monday.    ABDOMINAL PAIN (UMBILICAL HERNIA) Follow-up today for abdominal pain, seen initially on 02/10/24. Umbilical hernia noted on imaging performed 02/26/24. Went to ER last night and was given pain medication and Zofran , this offered benefit. Sees surgeon on Monday. Duration:weeks Onset: gradual Severity: 2/10 presently, 7-8/10 after eating Quality: burning and throbbing Location: umbilical area  Episode duration:  Radiation: no Frequency: intermittent Alleviating factors: relaxing Aggravating factors: activity and eating Status: stable Treatments attempted: rest and pain medication Fever: no Nausea: yes Vomiting: yes -- last was yesterday Weight loss: no Decreased appetite: yes Diarrhea: occasional at baseline Constipation: no Blood in stool: no Heartburn: yes Jaundice: no Rash: no Dysuria/urinary frequency: no Hematuria: no History of sexually transmitted disease: no Recurrent NSAID use: no   Relevant past medical, surgical, family and social history reviewed and updated as indicated. Interim medical history since our last visit reviewed. Allergies and medications reviewed and updated.  Review of Systems  Constitutional:  Negative for activity change, appetite change, diaphoresis, fatigue and fever.  Respiratory:  Negative for cough, chest tightness, shortness of breath and wheezing.   Cardiovascular:  Negative for chest pain, palpitations and leg swelling.  Gastrointestinal:   Positive for abdominal pain (occasional mild cramping) and diarrhea (baseline). Negative for abdominal distention, blood in stool, constipation, nausea and vomiting.  Neurological: Negative.   Psychiatric/Behavioral: Negative.     Per HPI unless specifically indicated above     Objective:    BP 119/76   Pulse 65   Temp 98.5 F (36.9 C) (Oral)   Ht 5' 10 (1.778 m)   Wt 256 lb 9.6 oz (116.4 kg)   SpO2 94%   BMI 36.82 kg/m   Wt Readings from Last 3 Encounters:  03/10/24 256 lb 9.6 oz (116.4 kg)  02/10/24 258 lb (117 kg)  12/04/23 265 lb 12.8 oz (120.6 kg)    Physical Exam Vitals and nursing note reviewed.  Constitutional:      General: He is awake. He is not in acute distress.    Appearance: He is well-developed and well-groomed. He is obese. He is not ill-appearing or toxic-appearing.  HENT:     Head: Normocephalic.     Right Ear: Hearing and external ear normal.     Left Ear: Hearing and external ear normal.  Eyes:     General: Lids are normal.     Extraocular Movements: Extraocular movements intact.     Conjunctiva/sclera: Conjunctivae normal.  Neck:     Thyroid: No thyromegaly.     Vascular: No carotid bruit.  Cardiovascular:     Rate and Rhythm: Normal rate and regular rhythm.     Heart sounds: Normal heart sounds. No murmur heard.    No gallop.  Pulmonary:     Effort: No accessory muscle usage or respiratory distress.  Breath sounds: Normal breath sounds. No decreased breath sounds, wheezing or rales.  Abdominal:     General: Bowel sounds are normal. There is no distension.     Palpations: Abdomen is soft.     Tenderness: There is no abdominal tenderness. There is no right CVA tenderness or left CVA tenderness.     Hernia: A hernia is present. Hernia is present in the ventral area.     Comments: Hernia palpated near umbilical area, site of previous surgery. .  Musculoskeletal:     Cervical back: Full passive range of motion without pain.     Right lower  leg: No edema.     Left lower leg: No edema.  Lymphadenopathy:     Cervical: No cervical adenopathy.  Skin:    General: Skin is warm.     Capillary Refill: Capillary refill takes less than 2 seconds.  Neurological:     Mental Status: He is alert and oriented to person, place, and time.     Deep Tendon Reflexes: Reflexes are normal and symmetric.     Reflex Scores:      Brachioradialis reflexes are 2+ on the right side and 2+ on the left side.      Patellar reflexes are 2+ on the right side and 2+ on the left side. Psychiatric:        Attention and Perception: Attention normal.        Mood and Affect: Mood normal.        Speech: Speech normal.        Behavior: Behavior normal. Behavior is cooperative.        Thought Content: Thought content normal.    Results for orders placed or performed during the hospital encounter of 03/09/24  Lipase, blood   Collection Time: 03/09/24  2:50 PM  Result Value Ref Range   Lipase 34 11 - 51 U/L  Comprehensive metabolic panel   Collection Time: 03/09/24  2:50 PM  Result Value Ref Range   Sodium 142 135 - 145 mmol/L   Potassium 5.0 3.5 - 5.1 mmol/L   Chloride 102 98 - 111 mmol/L   CO2 28 22 - 32 mmol/L   Glucose, Bld 89 70 - 99 mg/dL   BUN 16 6 - 20 mg/dL   Creatinine, Ser 9.02 0.61 - 1.24 mg/dL   Calcium 89.8 8.9 - 89.6 mg/dL   Total Protein 7.6 6.5 - 8.1 g/dL   Albumin  4.8 3.5 - 5.0 g/dL   AST 23 15 - 41 U/L   ALT 21 0 - 44 U/L   Alkaline Phosphatase 99 38 - 126 U/L   Total Bilirubin 0.3 0.0 - 1.2 mg/dL   GFR, Estimated >39 >39 mL/min   Anion gap 12 5 - 15  CBC   Collection Time: 03/09/24  2:50 PM  Result Value Ref Range   WBC 10.1 4.0 - 10.5 K/uL   RBC 5.56 4.22 - 5.81 MIL/uL   Hemoglobin 16.0 13.0 - 17.0 g/dL   HCT 51.3 60.9 - 47.9 %   MCV 87.4 80.0 - 100.0 fL   MCH 28.8 26.0 - 34.0 pg   MCHC 32.9 30.0 - 36.0 g/dL   RDW 88.0 88.4 - 84.4 %   Platelets 251 150 - 400 K/uL   nRBC 0.0 0.0 - 0.2 %  Urinalysis, Routine w reflex  microscopic -Urine, Clean Catch   Collection Time: 03/09/24  4:43 PM  Result Value Ref Range   Color, Urine YELLOW (A) YELLOW  APPearance CLEAR (A) CLEAR   Specific Gravity, Urine 1.018 1.005 - 1.030   pH 6.0 5.0 - 8.0   Glucose, UA NEGATIVE NEGATIVE mg/dL   Hgb urine dipstick NEGATIVE NEGATIVE   Bilirubin Urine NEGATIVE NEGATIVE   Ketones, ur NEGATIVE NEGATIVE mg/dL   Protein, ur NEGATIVE NEGATIVE mg/dL   Nitrite NEGATIVE NEGATIVE   Leukocytes,Ua NEGATIVE NEGATIVE      Assessment & Plan:   Problem List Items Addressed This Visit       Other   Paraumbilical hernia - Primary   Scheduled to see general surgery on Monday and will maintain this visit, suspect will need surgery. Recommend he take pain medication and Zofran  as needed. If any worsening pain over weekend he is aware to immediately go to ER.  All questions answered.        Follow up plan: Return for as scheduled April 30th.      "

## 2024-03-10 NOTE — Assessment & Plan Note (Signed)
 Scheduled to see general surgery on Monday and will maintain this visit, suspect will need surgery. Recommend he take pain medication and Zofran  as needed. If any worsening pain over weekend he is aware to immediately go to ER.  All questions answered.

## 2024-03-14 ENCOUNTER — Ambulatory Visit: Payer: Self-pay | Admitting: Surgery

## 2024-03-17 ENCOUNTER — Ambulatory Visit: Payer: Self-pay | Admitting: General Surgery

## 2024-03-17 ENCOUNTER — Telehealth: Payer: Self-pay | Admitting: General Surgery

## 2024-03-17 ENCOUNTER — Encounter: Payer: Self-pay | Admitting: General Surgery

## 2024-03-17 DIAGNOSIS — K439 Ventral hernia without obstruction or gangrene: Secondary | ICD-10-CM

## 2024-03-17 NOTE — Telephone Encounter (Signed)
 Patient has been advised of Pre-Admission date/time, and Surgery date at [facility].  Surgery Date: 03/28/24 Preadmission Testing Date: 03/18/24 (phone 8a-1p)  Patient informed of the scheduling process and surgery information given at time of office visit.   Patient has been made aware to call [phone], between 1-3:00pm the day before surgery, to find out what time to arrive for surgery.

## 2024-03-17 NOTE — Patient Instructions (Addendum)
 You have requested to have a Ventral Hernia Repair. This will be done by Dr Marinda at [facility]. Please see your (BLUE) Pre-care sheet for more information. Our surgery scheduler will call you to look at surgery dates and to go over surgery information.   If you are on any injectable weight loss medication, you will need to stop taking your GLP-1 injectable (weight loss) medications 8 days before your surgery to avoid any complications with anesthesia.   You will need to arrange to be out of work for approximately 1-2 weeks and then you may return with a lifting restriction for 4 more weeks. If you have FMLA or Disability paperwork that needs to be filled out, please have your company fax your paperwork to [phone] or you may drop this by either office. This paperwork will be filled out within 3 days after your surgery has been completed.     Ventral Hernia A ventral hernia (also called an incisional hernia) is a hernia that occurs at the site of a previous surgical cut (incision) in the abdomen. The abdominal wall spans from your lower chest down to your pelvis. If the abdominal wall is weakened from a surgical incision, a hernia can occur. A hernia is a bulge of bowel or muscle tissue pushing out on the weakened part of the abdominal wall. Ventral hernias can get bigger from straining or lifting. Obese and older people are at higher risk for a ventral hernia. People who develop infections after surgery or require repeat incisions at the same site on the abdomen are also at increased risk. CAUSES  A ventral hernia occurs because of weakness in the abdominal wall at an incision site.  SYMPTOMS  Common symptoms include: A visible bulge or lump on the abdominal wall. Pain or tenderness around the lump. Increased discomfort if you cough or make a sudden movement. If the hernia has blocked part of the intestine, a serious complication can occur (incarcerated or strangulated hernia). This can become a  problem that requires emergency surgery because the blood flow to the blocked intestine may be cut off. Symptoms may include: Feeling sick to your stomach (nauseous). Throwing up (vomiting). Stomach swelling (distention) or bloating. Fever. Rapid heartbeat. DIAGNOSIS  Your health care provider will take a medical history and perform a physical exam. Various tests may be ordered, such as: Blood tests. Urine tests. Ultrasonography. X-rays. Computed tomography (CT). TREATMENT  Watchful waiting may be all that is needed for a smaller hernia that does not cause symptoms. Your health care provider may recommend the use of a supportive belt (truss) that helps to keep the abdominal wall intact. For larger hernias or those that cause pain, surgery to repair the hernia is usually recommended. If a hernia becomes strangulated, emergency surgery needs to be done right away. HOME CARE INSTRUCTIONS Avoid putting pressure or strain on the abdominal area. Avoid heavy lifting. Use good body positioning for physical tasks. Ask your health care provider about proper body positioning. Use a supportive belt as directed by your health care provider. Maintain a healthy weight. Eat foods that are high in fiber, such as whole grains, fruits, and vegetables. Fiber helps prevent difficult bowel movements (constipation). Drink enough fluids to keep your urine clear or pale yellow. Follow up with your health care provider as directed. SEEK MEDICAL CARE IF:  Your hernia seems to be getting larger or more painful. SEEK IMMEDIATE MEDICAL CARE IF:  You have abdominal pain that is sudden and sharp. Your pain  becomes severe. You have repeated vomiting. You are sweating a lot. You notice a rapid heartbeat. You develop a fever. MAKE SURE YOU:  Understand these instructions. Will watch your condition. Will get help right away if you are not doing well or get worse.     Open Ventral Hernia Repair Open ventral  hernia repair is a surgery to fix a ventral hernia. A ventral hernia,  is a bulge of body tissue or intestines that pushes through the front part of the abdomen. This can happen if the connective tissue covering the muscles over the abdomen has a weak spot or is torn because of a surgical cut (incision) from a previous surgery. A ventral hernia repair is often done soon after diagnosis to stop the hernia from getting bigger, becoming uncomfortable, or becoming an emergency. This surgery usually takes about 2 hours, but the time can vary greatly.  LET [facility] CARE PROVIDER KNOW ABOUT: Any allergies you have. All medicines you are taking, including steroids, vitamins, herbs, eye drops, creams, and over-the-counter medicines. Previous problems you or members of your family have had with the use of anesthetics. Any blood disorders you have. Previous surgeries you have had. Medical conditions you have.  RISKS AND COMPLICATIONS  Generally, Open ventral hernia repair is a safe procedure. However, as with any surgical procedure, problems can occur. Possible problems include: Bleeding. Trouble passing urine or having a bowel movement after the surgery. Infection. Pneumonia. Blood clots. Pain in the area of the hernia. A bulge in the area of the hernia that may be caused by a collection of fluid. Injury to intestines or other structures in the abdomen. Return of the hernia after surgery.  BEFORE THE PROCEDURE  You may need to have blood tests, urine tests, a chest X-ray, or an electrocardiogram done before the day of the surgery. Ask your health care provider about changing or stopping your regular medicines. This is especially important if you are taking diabetes medicines or blood thinners. You may need to wash with a special type of germ-killing soap. Do not eat or drink anything after midnight the night before the procedure or as directed by your health care provider. Make plans to have  someone drive you home after the procedure.  PROCEDURE  Small monitors will be put on your body. They are used to check your heart, blood pressure, and oxygen level. An IV access tube will be put into a vein in your hand or arm. Fluids and medicine will flow directly into your body through the IV tube. You will be given medicine that makes you go to sleep (general anesthetic). Your abdomen will be cleaned with a special soap to kill any germs on your skin. Once you are asleep, a moderate - large size incision will be made in your abdomen. The size of incision depends on how large your hernia is. Your surgeon puts the tissue or intestines that formed the hernia back in place. A screen-like patch (mesh) is used to close the hernia. This helps make the area stronger. Stitches, tacks, or staples are used to keep the mesh in place. Medicine and a bandage (dressing) or skin glue will be put over the incision.  AFTER THE PROCEDURE  You will stay in a recovery area until the anesthetic wears off. Your blood pressure and pulse will be checked often. You may be able to go home the same day or may need to stay in the hospital for 1-2 days after surgery. Your  surgeon will decide when you can go home depending upon your recovery. You may feel some pain. You will be given medicine for pain. You will be urged to do breathing exercises that involve taking deep breaths. This helps prevent a lung infection after a surgery. You may have to wear compression stockings while you are in the hospital. These stockings help keep blood clots from forming in your legs.   This information is not intended to replace advice given to you by your health care provider. Make sure you discuss any questions you have with your health care provider.   Document Released: 01/21/2012 Document Revised: 02/08/2013 Document Reviewed: 01/21/2012 Elsevier Interactive Patient Education [employer]

## 2024-03-18 ENCOUNTER — Other Ambulatory Visit: Payer: Self-pay

## 2024-03-18 ENCOUNTER — Encounter
Admission: RE | Admit: 2024-03-18 | Discharge: 2024-03-18 | Disposition: A | Source: Ambulatory Visit | Attending: General Surgery

## 2024-03-18 HISTORY — DX: Ventral hernia without obstruction or gangrene: K43.9

## 2024-03-18 HISTORY — DX: Pure hypercholesterolemia, unspecified: E78.00

## 2024-03-18 HISTORY — DX: Other specified abnormal findings of blood chemistry: R79.89

## 2024-03-18 HISTORY — DX: Vitamin D deficiency, unspecified: E55.9

## 2024-03-18 HISTORY — DX: Acquired absence of other specified parts of digestive tract: Z90.49

## 2024-03-18 HISTORY — DX: Benign prostatic hyperplasia without lower urinary tract symptoms: N40.0

## 2024-03-18 HISTORY — DX: Gastro-esophageal reflux disease without esophagitis: K21.9

## 2024-03-18 HISTORY — DX: Male erectile dysfunction, unspecified: N52.9

## 2024-03-18 NOTE — Patient Instructions (Signed)
 Your procedure is scheduled on:03-28-24 Monday Report to the Registration Desk on the 1st floor of the [facility].Then proceed to the 2nd floor Surgery Desk To find out your arrival time, please call [phone] between 1PM - 3PM on:03-25-24 Friday If your arrival time is 6:00 am, do not arrive before that time as the [facility] entrance doors do not open until 6:00 am.  REMEMBER: Instructions that are not followed completely may result in serious medical risk, up to and including death; or upon the discretion of your surgeon and anesthesiologist your surgery may need to be rescheduled.  Do not eat food OR drink liquids after midnight the night before surgery.  No gum chewing or hard candies.  One week prior to surgery:Last dose will be on 03-20-24 Sunday Stop Anti-inflammatories (NSAIDS) such as Advil , Aleve, Ibuprofen , Motrin , Naproxen, Naprosyn and Aspirin based products such as Excedrin, Goody's Powder, BC Powder. Stop ANY OVER THE COUNTER supplements until after surgery (Vitamin D )  You may however, continue to take Tylenol /Percocet if needed for pain up until the day of surgery.  Continue taking all of your other prescription medications up until the day of surgery.  ON THE DAY OF SURGERY ONLY TAKE THESE MEDICATIONS WITH SIPS OF WATER: -amLODipine  (NORVASC )  -buPROPion  (WELLBUTRIN  XL)  -sertraline  (ZOLOFT )   No Alcohol for 24 hours before or after surgery.  No Smoking including e-cigarettes for 24 hours before surgery.  No chewable tobacco products for at least 6 hours before surgery.  No nicotine patches on the day of surgery.  Do not use any recreational drugs for at least a week (preferably 2 weeks) before your surgery.  Please be advised that the combination of cocaine and anesthesia may have negative outcomes, up to and including death. If you test positive for cocaine, your surgery will be cancelled.  On the morning of surgery brush your teeth with toothpaste and  water, you may rinse your mouth with mouthwash if you wish. Do not swallow any toothpaste or mouthwash.  Use CHG Soap as directed on instruction sheet.  Do not wear jewelry, make-up, hairpins, clips or nail polish.  For welded (permanent) jewelry: bracelets, anklets, waist bands, etc.  Please have this removed prior to surgery.  If it is not removed, there is a chance that hospital personnel will need to cut it off on the day of surgery.  Do not wear lotions, powders, or perfumes.   Do not shave body hair from the neck down 48 hours before surgery.  Contact lenses, hearing aids and dentures may not be worn into surgery.  Do not bring valuables to the hospital. [facility] is not responsible for any missing/lost belongings or valuables.    Notify your doctor if there is any change in your medical condition (cold, fever, infection).  Wear comfortable clothing (specific to your surgery type) to the hospital.  After surgery, you can help prevent lung complications by doing breathing exercises.  Take deep breaths and cough every 1-2 hours. Your doctor may order a device called an Incentive Spirometer to help you take deep breaths. When coughing or sneezing, hold a pillow firmly against your incision with both hands. This is called splinting. Doing this helps protect your incision. It also decreases belly discomfort.  If you are being admitted to the hospital overnight, leave your suitcase in the car. After surgery it may be brought to your room.  In case of increased patient census, it may be necessary for you, the patient,  to continue your postoperative care in the Same Day Surgery department.  If you are being discharged the day of surgery, you will not be allowed to drive home. You will need a responsible individual to drive you home and stay with you for 24 hours after surgery.   If you are taking public transportation, you will need to have a responsible individual with  you.  Please call the Pre-admissions Testing Dept. at [phone] if you have any questions about these instructions.  Surgery Visitation Policy:  Patients having surgery or a procedure may have two visitors.  Children under the age of 92 must have an adult with them who is not the patient.                                                                                                             Preparing for Surgery with CHLORHEXIDINE  GLUCONATE (CHG) Soap  Chlorhexidine  Gluconate (CHG) Soap  o An antiseptic cleaner that kills germs and bonds with the skin to continue killing germs even after washing  o Used for showering the night before surgery and morning of surgery  Before surgery, you can play an important role by reducing the number of germs on your skin.  CHG (Chlorhexidine  gluconate) soap is an antiseptic cleanser which kills germs and bonds with the skin to continue killing germs even after washing.  Please do not use if you have an allergy to CHG or antibacterial soaps. If your skin becomes reddened/irritated stop using the CHG.  1. Shower the NIGHT BEFORE SURGERY with CHG soap.  2. If you choose to wash your hair, wash your hair first as usual with your normal shampoo.  3. After shampooing, rinse your hair and body thoroughly to remove the shampoo.  4. Use CHG as you would any other liquid soap. You can apply CHG directly to the skin and wash gently with a clean washcloth.  5. Apply the CHG soap to your body only from the neck down. Do not use on open wounds or open sores. Avoid contact with your eyes, ears, mouth, and genitals (private parts). Wash face and genitals (private parts) with your normal soap.  6. Wash thoroughly, paying special attention to the area where your surgery will be performed.  7. Thoroughly rinse your body with warm water.  8. Do not shower/wash with your normal soap after using and rinsing off the CHG soap.  9. Do not use lotions, oils,  etc., after showering with CHG.  10. Pat yourself dry with a clean towel.  11. Wear clean pajamas to bed the night before surgery.  12. Place clean sheets on your bed the night of your shower and do not sleep with pets.  13. Do not apply any deodorants/lotions/powders.  14. Please wear clean clothes to the hospital.  15. Remember to brush your teeth with your regular toothpaste.   Merch[employer], Retail to address health-related social needs:  [email or web address]

## 2024-03-24 ENCOUNTER — Telehealth: Payer: Self-pay | Admitting: *Deleted

## 2024-03-24 NOTE — Telephone Encounter (Signed)
 Faxed Disability to [facility] Claims Department at [phone]

## 2024-03-28 ENCOUNTER — Ambulatory Visit: Admit: 2024-03-28 | Admitting: General Surgery

## 2024-03-28 ENCOUNTER — Encounter: Admission: RE | Payer: Self-pay | Source: Home / Self Care

## 2024-03-28 ENCOUNTER — Encounter: Payer: Self-pay | Admitting: Anesthesiology

## 2024-06-16 ENCOUNTER — Encounter: Payer: Self-pay | Admitting: Nurse Practitioner
